# Patient Record
Sex: Male | Born: 1955 | Race: White | Hispanic: No | Smoking: Current every day smoker
Health system: Southern US, Community
[De-identification: ages and names within clinical notes are randomized; demographics above are authoritative.]

## PROBLEM LIST (undated history)

## (undated) DIAGNOSIS — E119 Type 2 diabetes mellitus without complications: Secondary | ICD-10-CM

## (undated) DIAGNOSIS — I10 Essential (primary) hypertension: Secondary | ICD-10-CM

## (undated) HISTORY — PX: COLOSTOMY: SHX63

## (undated) HISTORY — PX: OTHER SURGICAL HISTORY: SHX169

## (undated) HISTORY — PX: ABOVE KNEE LEG AMPUTATION: SUR20

---

## 2010-12-22 ENCOUNTER — Inpatient Hospital Stay: Payer: Self-pay | Admitting: Internal Medicine

## 2013-12-16 ENCOUNTER — Ambulatory Visit: Payer: Self-pay | Admitting: Unknown Physician Specialty

## 2013-12-16 LAB — BASIC METABOLIC PANEL
Anion Gap: 7 (ref 7–16)
BUN: 9 mg/dL (ref 7–18)
CO2: 27 mmol/L (ref 21–32)
CREATININE: 1 mg/dL (ref 0.60–1.30)
Calcium, Total: 9.1 mg/dL (ref 8.5–10.1)
Chloride: 109 mmol/L — ABNORMAL HIGH (ref 98–107)
EGFR (African American): >60
Glucose: 92 mg/dL (ref 65–99)
Osmolality: 283 (ref 275–301)
Potassium: 3.3 mmol/L — ABNORMAL LOW (ref 3.5–5.1)
Sodium: 143 mmol/L (ref 136–145)

## 2013-12-16 LAB — CBC WITH DIFFERENTIAL/PLATELET
BASOS ABS: 0 10*3/uL (ref 0.0–0.1)
BASOS PCT: 0.9 %
EOS ABS: 0.2 10*3/uL (ref 0.0–0.7)
Eosinophil %: 3.6 %
HCT: 36.8 % — ABNORMAL LOW (ref 40.0–52.0)
HGB: 11.5 g/dL — ABNORMAL LOW (ref 13.0–18.0)
LYMPHS ABS: 0.7 10*3/uL — AB (ref 1.0–3.6)
Lymphocyte %: 12.4 %
MCH: 26.4 pg (ref 26.0–34.0)
MCHC: 31.3 g/dL — AB (ref 32.0–36.0)
MCV: 84 fL (ref 80–100)
Monocyte #: 0.5 x10 3/mm (ref 0.2–1.0)
Monocyte %: 9.5 %
NEUTROS ABS: 4 10*3/uL (ref 1.4–6.5)
NEUTROS PCT: 73.6 %
Platelet: 231 10*3/uL (ref 150–440)
RBC: 4.36 10*6/uL — ABNORMAL LOW (ref 4.40–5.90)
RDW: 16.3 % — ABNORMAL HIGH (ref 11.5–14.5)
WBC: 5.5 10*3/uL (ref 3.8–10.6)

## 2014-01-07 ENCOUNTER — Ambulatory Visit: Payer: Self-pay | Admitting: Gastroenterology

## 2014-01-07 LAB — CBC WITH DIFFERENTIAL/PLATELET
BASOS ABS: 0 10*3/uL (ref 0.0–0.1)
BASOS PCT: 0.7 %
Eosinophil #: 0.1 10*3/uL (ref 0.0–0.7)
Eosinophil %: 2.1 %
HCT: 31.4 % — ABNORMAL LOW (ref 40.0–52.0)
HGB: 9.7 g/dL — AB (ref 13.0–18.0)
LYMPHS PCT: 13.6 %
Lymphocyte #: 0.6 10*3/uL — ABNORMAL LOW (ref 1.0–3.6)
MCH: 25.4 pg — ABNORMAL LOW (ref 26.0–34.0)
MCHC: 31 g/dL — ABNORMAL LOW (ref 32.0–36.0)
MCV: 82 fL (ref 80–100)
MONOS PCT: 5.3 %
Monocyte #: 0.2 x10 3/mm (ref 0.2–1.0)
NEUTROS ABS: 3.5 10*3/uL (ref 1.4–6.5)
Neutrophil %: 78.3 %
PLATELETS: 229 10*3/uL (ref 150–440)
RBC: 3.84 10*6/uL — AB (ref 4.40–5.90)
RDW: 16.7 % — ABNORMAL HIGH (ref 11.5–14.5)
WBC: 4.5 10*3/uL (ref 3.8–10.6)

## 2014-01-07 LAB — BASIC METABOLIC PANEL
ANION GAP: 13 (ref 7–16)
BUN: 7 mg/dL (ref 7–18)
CALCIUM: 9 mg/dL (ref 8.5–10.1)
CHLORIDE: 103 mmol/L (ref 98–107)
Co2: 22 mmol/L (ref 21–32)
Creatinine: 0.88 mg/dL (ref 0.60–1.30)
EGFR (African American): >60
GLUCOSE: 108 mg/dL — AB (ref 65–99)
Osmolality: 274 (ref 275–301)
Potassium: 3.1 mmol/L — ABNORMAL LOW (ref 3.5–5.1)
Sodium: 138 mmol/L (ref 136–145)

## 2014-01-07 LAB — ETHANOL
ETHANOL %: 0.109 % — AB (ref 0.000–0.080)
Ethanol: 109 mg/dL

## 2014-01-20 ENCOUNTER — Ambulatory Visit: Payer: Self-pay | Admitting: Unknown Physician Specialty

## 2014-07-23 ENCOUNTER — Emergency Department: Payer: Self-pay | Admitting: Emergency Medicine

## 2014-07-23 LAB — URINALYSIS, COMPLETE
Bilirubin,UR: NEGATIVE
Glucose,UR: NEGATIVE mg/dL (ref 0–75)
Ketone: NEGATIVE
NITRITE: NEGATIVE
PH: 7 (ref 4.5–8.0)
Protein: 30
Specific Gravity: 1.006 (ref 1.003–1.030)
Squamous Epithelial: NONE SEEN

## 2014-07-27 LAB — URINE CULTURE

## 2014-08-22 ENCOUNTER — Emergency Department: Payer: Self-pay | Admitting: Emergency Medicine

## 2014-08-24 ENCOUNTER — Emergency Department: Payer: Self-pay | Admitting: Internal Medicine

## 2017-03-09 ENCOUNTER — Emergency Department: Payer: Medicaid Other

## 2017-03-09 ENCOUNTER — Inpatient Hospital Stay
Admission: EM | Admit: 2017-03-09 | Discharge: 2017-03-13 | DRG: 682 | Disposition: A | Discharge status: Home / Self Care | Payer: Medicaid Other | Attending: Internal Medicine | Admitting: Internal Medicine

## 2017-03-09 ENCOUNTER — Encounter: Payer: Self-pay | Admitting: Emergency Medicine

## 2017-03-09 DIAGNOSIS — R0602 Shortness of breath: Secondary | ICD-10-CM | POA: Diagnosis present

## 2017-03-09 DIAGNOSIS — I10 Essential (primary) hypertension: Secondary | ICD-10-CM | POA: Diagnosis present

## 2017-03-09 DIAGNOSIS — A419 Sepsis, unspecified organism: Secondary | ICD-10-CM

## 2017-03-09 DIAGNOSIS — N179 Acute kidney failure, unspecified: Secondary | ICD-10-CM | POA: Diagnosis present

## 2017-03-09 DIAGNOSIS — E875 Hyperkalemia: Secondary | ICD-10-CM

## 2017-03-09 DIAGNOSIS — L8990 Pressure ulcer of unspecified site, unspecified stage: Secondary | ICD-10-CM | POA: Diagnosis present

## 2017-03-09 DIAGNOSIS — F17210 Nicotine dependence, cigarettes, uncomplicated: Secondary | ICD-10-CM | POA: Diagnosis present

## 2017-03-09 DIAGNOSIS — K5792 Diverticulitis of intestine, part unspecified, without perforation or abscess without bleeding: Secondary | ICD-10-CM | POA: Diagnosis present

## 2017-03-09 DIAGNOSIS — Z933 Colostomy status: Secondary | ICD-10-CM | POA: Diagnosis not present

## 2017-03-09 DIAGNOSIS — R571 Hypovolemic shock: Secondary | ICD-10-CM | POA: Diagnosis present

## 2017-03-09 DIAGNOSIS — E11649 Type 2 diabetes mellitus with hypoglycemia without coma: Secondary | ICD-10-CM | POA: Diagnosis present

## 2017-03-09 DIAGNOSIS — E872 Acidosis: Secondary | ICD-10-CM | POA: Diagnosis present

## 2017-03-09 DIAGNOSIS — Z881 Allergy status to other antibiotic agents status: Secondary | ICD-10-CM | POA: Diagnosis not present

## 2017-03-09 DIAGNOSIS — I959 Hypotension, unspecified: Secondary | ICD-10-CM | POA: Diagnosis not present

## 2017-03-09 DIAGNOSIS — R6521 Severe sepsis with septic shock: Secondary | ICD-10-CM | POA: Diagnosis not present

## 2017-03-09 DIAGNOSIS — E871 Hypo-osmolality and hyponatremia: Secondary | ICD-10-CM

## 2017-03-09 DIAGNOSIS — R918 Other nonspecific abnormal finding of lung field: Secondary | ICD-10-CM

## 2017-03-09 DIAGNOSIS — Z880 Allergy status to penicillin: Secondary | ICD-10-CM | POA: Diagnosis not present

## 2017-03-09 DIAGNOSIS — Z89611 Acquired absence of right leg above knee: Secondary | ICD-10-CM | POA: Diagnosis not present

## 2017-03-09 DIAGNOSIS — F1010 Alcohol abuse, uncomplicated: Secondary | ICD-10-CM | POA: Diagnosis present

## 2017-03-09 DIAGNOSIS — E1165 Type 2 diabetes mellitus with hyperglycemia: Secondary | ICD-10-CM | POA: Diagnosis not present

## 2017-03-09 DIAGNOSIS — J441 Chronic obstructive pulmonary disease with (acute) exacerbation: Secondary | ICD-10-CM | POA: Diagnosis present

## 2017-03-09 DIAGNOSIS — Z23 Encounter for immunization: Secondary | ICD-10-CM

## 2017-03-09 HISTORY — DX: Type 2 diabetes mellitus without complications: E11.9

## 2017-03-09 HISTORY — DX: Essential (primary) hypertension: I10

## 2017-03-09 LAB — HEPATIC FUNCTION PANEL
ALBUMIN: 3.1 g/dL — AB (ref 3.5–5.0)
ALK PHOS: 127 U/L — AB (ref 38–126)
ALT: 12 U/L — AB (ref 17–63)
AST: 37 U/L (ref 15–41)
BILIRUBIN TOTAL: 0.8 mg/dL (ref 0.3–1.2)
Bilirubin, Direct: 0.2 mg/dL (ref 0.1–0.5)
Indirect Bilirubin: 0.6 mg/dL (ref 0.3–0.9)
TOTAL PROTEIN: 7.1 g/dL (ref 6.5–8.1)

## 2017-03-09 LAB — LACTIC ACID, PLASMA: Lactic Acid, Venous: 2.4 mmol/L (ref 0.5–1.9)

## 2017-03-09 LAB — CBC WITH DIFFERENTIAL/PLATELET
BASOS ABS: 0 10*3/uL (ref 0–0.1)
BASOS PCT: 0 %
EOS ABS: 0.1 10*3/uL (ref 0–0.7)
Eosinophils Relative: 2 %
HCT: 34.6 % — ABNORMAL LOW (ref 40.0–52.0)
Hemoglobin: 11.8 g/dL — ABNORMAL LOW (ref 13.0–18.0)
Lymphocytes Relative: 13 %
Lymphs Abs: 0.8 10*3/uL — ABNORMAL LOW (ref 1.0–3.6)
MCH: 31 pg (ref 26.0–34.0)
MCHC: 34.2 g/dL (ref 32.0–36.0)
MCV: 90.8 fL (ref 80.0–100.0)
Monocytes Absolute: 0.2 10*3/uL (ref 0.2–1.0)
Monocytes Relative: 4 %
NEUTROS PCT: 81 %
Neutro Abs: 5.1 10*3/uL (ref 1.4–6.5)
Platelets: 209 10*3/uL (ref 150–440)
RBC: 3.81 MIL/uL — AB (ref 4.40–5.90)
RDW: 13.9 % (ref 11.5–14.5)
WBC: 6.2 10*3/uL (ref 3.8–10.6)

## 2017-03-09 LAB — GLUCOSE, CAPILLARY
Glucose-Capillary: 161 mg/dL — ABNORMAL HIGH (ref 65–99)
Glucose-Capillary: 28 mg/dL — CL (ref 65–99)
Glucose-Capillary: 166 mg/dL — ABNORMAL HIGH (ref 65–99)

## 2017-03-09 LAB — BASIC METABOLIC PANEL WITH GFR
Anion gap: 10 (ref 5–15)
BUN: 10 mg/dL (ref 6–20)
CO2: 17 mmol/L — ABNORMAL LOW (ref 22–32)
Calcium: 8.7 mg/dL — ABNORMAL LOW (ref 8.9–10.3)
Chloride: 91 mmol/L — ABNORMAL LOW (ref 101–111)
Creatinine, Ser: 1.81 mg/dL — ABNORMAL HIGH (ref 0.61–1.24)
GFR calc Af Amer: 44 mL/min — ABNORMAL LOW
GFR calc non Af Amer: 38 mL/min — ABNORMAL LOW
Glucose, Bld: 81 mg/dL (ref 65–99)
Potassium: 5.2 mmol/L — ABNORMAL HIGH (ref 3.5–5.1)
Sodium: 118 mmol/L — CL (ref 135–145)

## 2017-03-09 LAB — TROPONIN I: Troponin I: <0.03 ng/mL

## 2017-03-09 LAB — BRAIN NATRIURETIC PEPTIDE: B Natriuretic Peptide: 14 pg/mL (ref 0.0–100.0)

## 2017-03-09 MED ORDER — SODIUM CHLORIDE 0.9 % IV SOLN
INTRAVENOUS | Status: DC
  Administered 2017-03-10: 1000 mL via INTRAVENOUS

## 2017-03-09 MED ORDER — ENOXAPARIN SODIUM 40 MG/0.4ML ~~LOC~~ SOLN
40.0000 mg | SUBCUTANEOUS | Status: DC
  Administered 2017-03-10 (×2): 40 mg via SUBCUTANEOUS
  Filled 2017-03-09 (×2): qty 0.4

## 2017-03-09 MED ORDER — SODIUM POLYSTYRENE SULFONATE 15 GM/60ML PO SUSP: 15.0000 g | Freq: Once | ORAL | Status: DC

## 2017-03-09 MED ORDER — DEXTROSE 50 % IV SOLN
1.0000 | Freq: Once | INTRAVENOUS | Status: AC
  Administered 2017-03-09: 50 mL via INTRAVENOUS
  Filled 2017-03-09: qty 50

## 2017-03-09 MED ORDER — INSULIN ASPART 100 UNIT/ML ~~LOC~~ SOLN
0.0000 [IU] | Freq: Three times a day (TID) | SUBCUTANEOUS | Status: DC
  Administered 2017-03-10: 8 [IU] via SUBCUTANEOUS
  Administered 2017-03-10: 5 [IU] via SUBCUTANEOUS
  Filled 2017-03-09 (×2): qty 1

## 2017-03-09 MED ORDER — SODIUM CHLORIDE 0.9 % IV BOLUS (SEPSIS)
1000.0000 mL | Freq: Once | INTRAVENOUS | Status: AC
  Administered 2017-03-09: 1000 mL via INTRAVENOUS

## 2017-03-09 MED ORDER — IPRATROPIUM-ALBUTEROL 0.5-2.5 (3) MG/3ML IN SOLN
3.0000 mL | Freq: Once | RESPIRATORY_TRACT | Status: AC
  Administered 2017-03-09: 3 mL via RESPIRATORY_TRACT
  Filled 2017-03-09: qty 3

## 2017-03-09 MED ORDER — POLYETHYLENE GLYCOL 3350 17 G PO PACK: 17.0000 g | PACK | Freq: Every day | ORAL | Status: DC | PRN

## 2017-03-09 MED ORDER — AMLODIPINE BESYLATE 10 MG PO TABS
10.0000 mg | ORAL_TABLET | Freq: Every day | ORAL | Status: DC
  Filled 2017-03-09 (×2): qty 1

## 2017-03-09 MED ORDER — THIAMINE HCL 100 MG/ML IJ SOLN
Freq: Once | INTRAVENOUS | Status: AC
  Administered 2017-03-09: via INTRAVENOUS
  Filled 2017-03-09: qty 1000

## 2017-03-09 MED ORDER — ONDANSETRON HCL 4 MG PO TABS
4.0000 mg | ORAL_TABLET | Freq: Four times a day (QID) | ORAL | Status: DC | PRN
  Administered 2017-03-11: 4 mg via ORAL
  Filled 2017-03-09: qty 1

## 2017-03-09 MED ORDER — ONDANSETRON HCL 4 MG/2ML IJ SOLN
4.0000 mg | Freq: Four times a day (QID) | INTRAMUSCULAR | Status: DC | PRN
  Administered 2017-03-10: 4 mg via INTRAVENOUS
  Filled 2017-03-09: qty 2

## 2017-03-09 MED ORDER — FERROUS SULFATE 325 (65 FE) MG PO TABS
325.0000 mg | ORAL_TABLET | Freq: Every day | ORAL | Status: DC
  Administered 2017-03-10 – 2017-03-13 (×4): 325 mg via ORAL
  Filled 2017-03-09 (×4): qty 1

## 2017-03-09 MED ORDER — INSULIN ASPART 100 UNIT/ML ~~LOC~~ SOLN
10.0000 [IU] | Freq: Once | SUBCUTANEOUS | Status: AC
  Administered 2017-03-09: 10 [IU] via INTRAVENOUS
  Filled 2017-03-09: qty 1

## 2017-03-09 MED ORDER — ACETAMINOPHEN 325 MG PO TABS
650.0000 mg | ORAL_TABLET | Freq: Four times a day (QID) | ORAL | Status: DC | PRN
  Administered 2017-03-10 – 2017-03-12 (×5): 650 mg via ORAL
  Filled 2017-03-09 (×5): qty 2

## 2017-03-09 MED ORDER — SULFAMETHOXAZOLE-TRIMETHOPRIM 800-160 MG PO TABS
1.0000 | ORAL_TABLET | Freq: Two times a day (BID) | ORAL | Status: DC
  Administered 2017-03-10 – 2017-03-13 (×8): 1 via ORAL
  Filled 2017-03-09 (×9): qty 1

## 2017-03-09 MED ORDER — FOLIC ACID 1 MG PO TABS
1.0000 mg | ORAL_TABLET | Freq: Every day | ORAL | Status: DC
  Administered 2017-03-10 – 2017-03-13 (×4): 1 mg via ORAL
  Filled 2017-03-09 (×4): qty 1

## 2017-03-09 MED ORDER — DEXTROSE 50 % IV SOLN
1.0000 | Freq: Once | INTRAVENOUS | Status: AC
  Administered 2017-03-09: 50 mL via INTRAVENOUS

## 2017-03-09 MED ORDER — DEXTROSE 50 % IV SOLN
INTRAVENOUS | Status: AC
  Filled 2017-03-09: qty 100

## 2017-03-09 MED ORDER — NOREPINEPHRINE BITARTRATE 1 MG/ML IV SOLN
0.0000 ug/min | Freq: Once | INTRAVENOUS | Status: AC
  Administered 2017-03-09: 2 ug/min via INTRAVENOUS
  Filled 2017-03-09: qty 4

## 2017-03-09 MED ORDER — PREDNISONE 20 MG PO TABS
50.0000 mg | ORAL_TABLET | Freq: Every day | ORAL | Status: DC
  Administered 2017-03-10 – 2017-03-12 (×3): 50 mg via ORAL
  Filled 2017-03-09 (×3): qty 1

## 2017-03-09 MED ORDER — ALBUTEROL SULFATE (2.5 MG/3ML) 0.083% IN NEBU: 2.5000 mg | INHALATION_SOLUTION | RESPIRATORY_TRACT | Status: DC | PRN

## 2017-03-09 MED ORDER — INDOMETHACIN 50 MG PO CAPS
50.0000 mg | ORAL_CAPSULE | Freq: Two times a day (BID) | ORAL | Status: DC
  Administered 2017-03-10: 50 mg via ORAL
  Filled 2017-03-09: qty 1

## 2017-03-09 MED ORDER — PANTOPRAZOLE SODIUM 40 MG PO TBEC
40.0000 mg | DELAYED_RELEASE_TABLET | Freq: Every day | ORAL | Status: DC
  Administered 2017-03-10 – 2017-03-13 (×4): 40 mg via ORAL
  Filled 2017-03-09 (×4): qty 1

## 2017-03-09 MED ORDER — SODIUM BICARBONATE 8.4 % IV SOLN
50.0000 meq | Freq: Once | INTRAVENOUS | Status: AC
  Administered 2017-03-09: 50 meq via INTRAVENOUS
  Filled 2017-03-09: qty 50

## 2017-03-09 MED ORDER — VANCOMYCIN HCL IN DEXTROSE 1-5 GM/200ML-% IV SOLN
1000.0000 mg | Freq: Once | INTRAVENOUS | Status: AC
  Administered 2017-03-09: 1000 mg via INTRAVENOUS
  Filled 2017-03-09: qty 200

## 2017-03-09 MED ORDER — LEVOFLOXACIN IN D5W 750 MG/150ML IV SOLN
750.0000 mg | Freq: Once | INTRAVENOUS | Status: AC
Start: 2017-03-09 — End: 2017-03-09
  Administered 2017-03-09: 750 mg via INTRAVENOUS
  Filled 2017-03-09: qty 150

## 2017-03-09 MED ORDER — ADULT MULTIVITAMIN W/MINERALS CH
1.0000 | ORAL_TABLET | Freq: Every day | ORAL | Status: DC
  Administered 2017-03-10 – 2017-03-13 (×4): 1 via ORAL
  Filled 2017-03-09 (×4): qty 1

## 2017-03-09 MED ORDER — SIMVASTATIN 10 MG PO TABS
10.0000 mg | ORAL_TABLET | Freq: Every day | ORAL | Status: DC
  Administered 2017-03-10: 10 mg via ORAL
  Administered 2017-03-10: 03:00:00 via ORAL
  Administered 2017-03-11 – 2017-03-13 (×3): 10 mg via ORAL
  Filled 2017-03-09 (×5): qty 1

## 2017-03-09 MED ORDER — VITAMIN B-1 100 MG PO TABS
100.0000 mg | ORAL_TABLET | Freq: Every day | ORAL | Status: DC
  Administered 2017-03-10 – 2017-03-13 (×4): 100 mg via ORAL
  Filled 2017-03-09 (×4): qty 1

## 2017-03-09 MED ORDER — POTASSIUM CHLORIDE CRYS ER 20 MEQ PO TBCR
20.0000 meq | EXTENDED_RELEASE_TABLET | Freq: Every day | ORAL | Status: DC
  Administered 2017-03-10 – 2017-03-13 (×4): 20 meq via ORAL
  Filled 2017-03-09 (×4): qty 1

## 2017-03-09 MED ORDER — NICOTINE 14 MG/24HR TD PT24
14.0000 mg | MEDICATED_PATCH | Freq: Every day | TRANSDERMAL | Status: DC
  Administered 2017-03-10 – 2017-03-13 (×4): 14 mg via TRANSDERMAL
  Filled 2017-03-09 (×4): qty 1

## 2017-03-09 MED ORDER — ALBUTEROL SULFATE (2.5 MG/3ML) 0.083% IN NEBU: 5.0000 mg | INHALATION_SOLUTION | Freq: Once | RESPIRATORY_TRACT | Status: DC

## 2017-03-09 MED ORDER — IPRATROPIUM-ALBUTEROL 0.5-2.5 (3) MG/3ML IN SOLN
3.0000 mL | Freq: Four times a day (QID) | RESPIRATORY_TRACT | Status: DC
  Administered 2017-03-10 – 2017-03-11 (×4): 3 mL via RESPIRATORY_TRACT
  Filled 2017-03-09 (×7): qty 3

## 2017-03-09 MED ORDER — INSULIN ASPART 100 UNIT/ML ~~LOC~~ SOLN: 2.0000 [IU] | SUBCUTANEOUS | Status: DC

## 2017-03-09 NOTE — ED Notes (Signed)
RN paged admitting MD to inform BP continues to drop. MD verbalized to keep 2 NS bolus's running wide open and page again in 30 minutes if pts condition has not improved. 1 NS bolus has finished. Second bag placed on pressure bag and third bag started sue to pts  Continued drop in BP.

## 2017-03-09 NOTE — Progress Notes (Signed)
Anticoagulation monitoring(Lovenox):  61 yo male ordered Lovenox 40 mg Q24h  Filed Weights   03/09/17 1838 03/09/17 2337  Weight: 170 lb (77.1 kg) 147 lb 11.3 oz (67 kg)   BMI    Lab Results  Component Value Date   CREATININE 1.81 (H) 03/09/2017   CREATININE 0.88 01/07/2014   CREATININE 1.00 12/16/2013   Estimated Creatinine Clearance: 39.6 mL/min (A) (by C-G formula based on SCr of 1.81 mg/dL (H)). Hemoglobin & Hematocrit     Component Value Date/Time   HGB 11.8 (L) 03/09/2017 1852   HGB 9.7 (L) 01/07/2014 1906   HCT 34.6 (L) 03/09/2017 1852   HCT 31.4 (L) 01/07/2014 1906     Per Protocol for Patient with estCrcl > 30 ml/min and BMI < 40, will transition to Lovenox 40 mg Q24h.

## 2017-03-09 NOTE — Consult Note (Signed)
Name: Shawn SchlichterRobert Higgins MRN: [id number] DOB: 13-Jun-1956    ADMISSION DATE:  03/09/2017  CONSULTATION DATE: 03/09/17  REFERRING MD :  Dr. Claudette Higgins  CHIEF COMPLAINT:  Shortness of breath and hypotension  BRIEF PATIENT DESCRIPTION: 61 year old male with hyponatremia ,Acute kidney injury possibly related to hypovolemia.  Now in shock and on pressors.  SIGNIFICANT EVENTS  8/30 Patient admitted to the ICU with hyponatrmia,AKI and hypovolemic shock and now requiring pressors.  STUDIES:  None   HISTORY OF PRESENT ILLNESS: [name] is a 61 year old male with known history of Diabetes Mellitus,Hypertension, Right above Knee amputation,ETOH,Tobacco abuse and diverticulitis.  Patient came in 8/30 with shortness of breath and was completely resolved after giving breathing treatments. Patient also states that he had dry heaves,poor po intake for almost a week.  Patient was noted to be hyponatremic , acute kidney injury and severely hypotensive.  Patient was given fluid  Boluses and was started on pressors.  Patient refuses to get the central line placed per hospitalist.  Patient transferred to the ICU for further management.  PAST MEDICAL HISTORY :   has a past medical history of Diabetes mellitus without complication (HCC) and Hypertension.  has a past surgical history that includes Above knee leg amputaton (Right); Colostomy; and shoulder surgery. Prior to Admission medications   Medication Sig Start Date End Date Taking? Authorizing Provider  acetaminophen (TYLENOL) 325 MG tablet Take 650 mg by mouth every 4 (four) hours as needed.   Yes [provider]  amLODipine (NORVASC) 10 MG tablet Take 10 mg by mouth daily.   Yes [provider]  ferrous sulfate 325 (65 FE) MG tablet Take 1 tablet by mouth daily.   Yes [provider]  folic acid (FOLVITE) 1 MG tablet Take 1 mg by mouth daily.   Yes [provider]  indomethacin (INDOCIN) 50 MG capsule Take 50 mg by  mouth 2 (two) times daily with a meal.   Yes [provider]  lisinopril (PRINIVIL,ZESTRIL) 20 MG tablet Take 20 mg by mouth 2 (two) times daily.   Yes [provider]  Multiple Vitamin (MULTIVITAMIN) tablet Take 1 tablet by mouth daily.   Yes [provider]  omeprazole (PRILOSEC) 20 MG capsule Take 1 capsule by mouth daily.   Yes [provider]  potassium chloride SA (K-DUR,KLOR-CON) 20 MEQ tablet Take 20 mEq by mouth daily.   Yes [provider]  simvastatin (ZOCOR) 10 MG tablet Take 1 tablet by mouth daily.   Yes [provider]  thiamine (VITAMIN B-1) 100 MG tablet Take 100 mg by mouth daily.   Yes [provider]  sulfamethoxazole-trimethoprim (BACTRIM DS,SEPTRA DS) 800-160 MG tablet Take 1 tablet by mouth 2 (two) times daily. 02/28/17 03/09/17  [provider]   Allergies  Allergen Reactions  . Cephalosporins   . Penicillins     FAMILY HISTORY:  family history is not on file. SOCIAL HISTORY:  reports that he has been smoking Cigarettes.  He has been smoking about 0.50 packs per day. He has quit using smokeless tobacco. He reports that he drinks alcohol. He reports that he does not use drugs.  REVIEW OF SYSTEMS:   Constitutional: Negative for fever, chills, weight loss, malaise/fatigue and diaphoresis.  HENT: Negative for hearing loss, ear pain, nosebleeds, congestion, sore throat, neck pain, tinnitus and ear discharge.   Eyes: Negative for blurred vision, double vision, photophobia, pain, discharge and redness.  Respiratory: Negative for cough, hemoptysis, sputum  production, shortness of breath, wheezing and stridor.   Cardiovascular: Negative for chest pain, palpitations, orthopnea, claudication, leg swelling and PND.  Gastrointestinal: Negative for heartburn, nausea, vomiting, abdominal pain, diarrhea, constipation, blood in stool and melena.  Genitourinary: Negative for dysuria, urgency, frequency, hematuria  and flank pain.  Musculoskeletal: Negative for myalgias, back pain, joint pain and falls.  Skin: Negative for itching and rash.  Neurological: Negative for dizziness, tingling, tremors, sensory change, speech change, focal weakness, seizures, loss of consciousness, weakness and headaches.  Endo/Heme/Allergies: Negative for environmental allergies and polydipsia. Does not bruise/bleed easily.  SUBJECTIVE: Patient states that "He is feeling alright"  VITAL SIGNS: Temp:  [98.5 F (36.9 C)] 98.5 F (36.9 C) (08/30 1832) Pulse Rate:  [84-113] 100 (08/30 2215) Resp:  [13-36] 20 (08/30 2215) BP: (50-119)/(33-85) 63/51 (08/30 2214) SpO2:  [90 %-99 %] 91 % (08/30 2215) Weight:  [77.1 kg (170 lb)] 77.1 kg (170 lb) (08/30 1838)  PHYSICAL EXAMINATION: General:  61 year old male in no acute distress Neuro:  Awake,Alert and oriented HEENT:  AT,[place],No JVD Cardiovascular:  S1S2,Regular, no m/r/g Lungs:  Clear bilaterally, no wheezes,crackles,rhonchi noted Abdomen:  Soft,NT,ND, positive bowel sounds Musculoskeletal:  No edema,cyanosis, Right AKA Skin:  Warm,dry and intact   Recent Labs Lab 03/09/17 1852  NA 118*  K 5.2*  CL 91*  CO2 17*  BUN 10  CREATININE 1.81*  GLUCOSE 81    Recent Labs Lab 03/09/17 1852  HGB 11.8*  HCT 34.6*  WBC 6.2  PLT 209   Dg Chest 2 View  Result Date: 03/09/2017 CLINICAL DATA:  Shortness of breath since earlier this afternoon, hypoglycemia. EXAM: CHEST  2 VIEW COMPARISON:  12/22/2010 FINDINGS: Enlargement of cardiac silhouette. Atherosclerotic calcification aorta. Mediastinal contours and pulmonary vascularity normal. Emphysematous and bronchitic changes consistent with COPD. New elevation of the LEFT diaphragm with significant LEFT basilar atelectasis. Streaky atelectasis at RIGHT base as well. Upper lungs clear. No pleural effusion or pneumothorax. Bones demineralized with evidence of prior cervical spine fusion and proximal LEFT humeral ORIF. IMPRESSION:  Bibasilar atelectasis greater on LEFT with new elevation of the LEFT diaphragm. Underlying emphysematous and bronchitic changes. Mild enlargement of cardiac silhouette. Electronically Signed   By: Ulyses Southward M.D.   On: 03/09/2017 20:29    ASSESSMENT / PLAN: Hypovolemic shock Hypovolemic hyponatremia Vs ETOH abuse. Hyperkalemia Acute Kidney Injury related to poor po intake Tobacco abuse ETOH abuse COPD Hypoglycemia   Plan -Continue I/V fluids -Continue levophed gtt -Keep MAP goals>65 -Will place central line if levophed demand continues to increase>10 mcg, obtained consent and the patient verbalizes understanding -Continue Nicotine patch - Continue Bronchodilators -Continue Thiamine and folic acid - monitor I/O -Blood glucose checks with SSI -follow hypoglycemia protocol - avoid nephrotoxic drugs -Monitor BMET -Follow CBC,fever - will initiate CIWA if needed - serial sodium checks - follow serum osmolality and random urine osmolality -Hold antihypertensives now.     Bernie Fobes,AG-ACNP Pulmonary and [facility]   03/09/2017, 10:15 PM

## 2017-03-09 NOTE — ED Triage Notes (Signed)
[facility] c/o shortness of breath that started earlier in the afternoon; per EMS pt's VS were WDL, O2 sats were 96% on RA; pt was put on 3L via  and maintained O2 sats at 96%; pt's CBG was 69, pt was given some crackers to eat and CBG brought up to 87; pt has a colostomy bag on the left lower abdomen, and above the knee amputation of the right leg; at this time, pt is awake, alert and oriented x4.

## 2017-03-09 NOTE — ED Notes (Signed)
Contacted pharmacy to inquire how this RN can administer the multivitamin, thiamine, folic acid, in NS bolus with the patient only having 2 IVs; pharmacy states, since the patient has levaquin and vanc running on one side and norepi on the other side, we have to wait until the levaquin is done to start the bolus. MD notified.

## 2017-03-09 NOTE — ED Notes (Signed)
RN paged MD again. PT continues to be hypotensive and is very diaphoretic. Pt reports feeling lightheaded and feeling as though he has a rushing feeling in his head.

## 2017-03-09 NOTE — H&P (Signed)
[facility] [place] [facility]   PATIENT NAME: Shawn SchlichterRobert Higgins    MR#:  [id number]  DATE OF BIRTH:  May 11, 1956  DATE OF ADMISSION:  03/09/2017  PRIMARY CARE PHYSICIAN: Armando GangLindley, Cheryl P, FNP   REQUESTING/REFERRING PHYSICIAN:   CHIEF COMPLAINT:  Shortness of breath and dry heaves  HISTORY OF PRESENT ILLNESS:  [name]  is a 61 y.o. male with a known history of diverticulitis, hypertension, tobacco abuse is presenting to the ED with a chief complaint of dry heaves, poor by mouth intake for almost 1 week. Blood pressure was soft , and patient's shortness of breath was completely resolved after giving breathing treatments. Sodium was low at 118 and creatinine is at 1.8. Patient denies any abdominal pain or vomiting. Denies any diarrhea. No sick contacts. No recent travel.patient reports he had colostomy done during his childhood and could not recall the etiology  PAST MEDICAL HISTORY:   Past Medical History:  Diagnosis Date  . Diabetes mellitus without complication (HCC)   . Hypertension     PAST SURGICAL HISTOIRY:   Past Surgical History:  Procedure Laterality Date  . ABOVE KNEE LEG AMPUTATION Right   . COLOSTOMY    . shoulder surgery      SOCIAL HISTORY:   Social History  Substance Use Topics  . Smoking status: Current Every Day Smoker    Packs/day: 0.50    Types: Cigarettes  . Smokeless tobacco: Former NeurosurgeonUser  . Alcohol use Yes    FAMILY HISTORY:  History reviewed. No pertinent family history.  DRUG ALLERGIES:   Allergies  Allergen Reactions  . Cephalosporins   . Penicillins     REVIEW OF SYSTEMS:  CONSTITUTIONAL: No fever, fatigue or weakness.  EYES: No blurred or double vision.  EARS, NOSE, AND THROAT: No tinnitus or ear pain.  RESPIRATORY: No cough, reportingshortness of breath with exertion,denies wheezing or hemoptysis.  CARDIOVASCULAR: No chest pain, orthopnea, edema.  GASTROINTESTINAL: reporting dry heaves but no  vomiting or diarrhea and denies any abdominal pain has chronic colostomy GENITOURINARY: No dysuria, hematuria.  ENDOCRINE: No polyuria, nocturia,  HEMATOLOGY: No anemia, easy bruising or bleeding SKIN: No rash or lesion. MUSCULOSKELETAL: No joint pain or arthritis.   NEUROLOGIC: No tingling, numbness, weakness.  PSYCHIATRY: No anxiety or depression.   MEDICATIONS AT HOME:   Prior to Admission medications   Medication Sig Start Date End Date Taking? Authorizing Provider  acetaminophen (TYLENOL) 325 MG tablet Take 650 mg by mouth every 4 (four) hours as needed.   Yes [provider]  amLODipine (NORVASC) 10 MG tablet Take 10 mg by mouth daily.   Yes [provider]  ferrous sulfate 325 (65 FE) MG tablet Take 1 tablet by mouth daily.   Yes [provider]  folic acid (FOLVITE) 1 MG tablet Take 1 mg by mouth daily.   Yes [provider]  indomethacin (INDOCIN) 50 MG capsule Take 50 mg by mouth 2 (two) times daily with a meal.   Yes [provider]  lisinopril (PRINIVIL,ZESTRIL) 20 MG tablet Take 20 mg by mouth 2 (two) times daily.   Yes [provider]  Multiple Vitamin (MULTIVITAMIN) tablet Take 1 tablet by mouth daily.   Yes [provider]  omeprazole (PRILOSEC) 20 MG capsule Take 1 capsule by mouth daily.   Yes [provider]  potassium chloride SA (K-DUR,KLOR-CON) 20 MEQ tablet Take 20 mEq by mouth daily.   Yes [provider]  simvastatin (ZOCOR) 10  MG tablet Take 1 tablet by mouth daily.   Yes [provider]  thiamine (VITAMIN B-1) 100 MG tablet Take 100 mg by mouth daily.   Yes [provider]  sulfamethoxazole-trimethoprim (BACTRIM DS,SEPTRA DS) 800-160 MG tablet Take 1 tablet by mouth 2 (two) times daily. 02/28/17 03/09/17  [provider]      VITAL SIGNS:  Blood pressure 119/85, pulse 84, temperature 98.5 F (36.9 C), temperature source Oral, resp. rate 20, height 5\' 10"   (1.778 m), weight 77.1 kg (170 lb), SpO2 99 %.  PHYSICAL EXAMINATION:  GENERAL:  61 y.o.-year-old patient lying in the bed with no acute distress.  EYES: Pupils equal, round, reactive to light and accommodation. No scleral icterus. Extraocular muscles intact.  HEENT: Head atraumatic, normocephalic. Oropharynx and nasopharynx clear.  NECK:  Supple, no jugular venous distention. No thyroid enlargement, no tenderness.  LUNGS: Normal breath sounds bilaterally, no wheezing, rales,rhonchi or crepitation. No use of accessory muscles of respiration.  CARDIOVASCULAR: S1, S2 normal. No murmurs, rubs, or gallops.  ABDOMEN: Soft, nontender, nondistended. Bowel sounds present.colostomy site is intact  EXTREMITIES: No pedal edema, cyanosis, or clubbing.  NEUROLOGIC: Cranial nerves II through XII are intact. Muscle strength 5/5 in all extremities. Sensation intact. Gait not checked.  PSYCHIATRIC: The patient is alert and oriented x 3.  SKIN: No obvious rash, lesion, or ulcer.   LABORATORY PANEL:   CBC  Recent Labs Lab 03/09/17 1852  WBC 6.2  HGB 11.8*  HCT 34.6*  PLT 209   ------------------------------------------------------------------------------------------------------------------  Chemistries   Recent Labs Lab 03/09/17 1852  NA 118*  K 5.2*  CL 91*  CO2 17*  GLUCOSE 81  BUN 10  CREATININE 1.81*  CALCIUM 8.7*   ------------------------------------------------------------------------------------------------------------------  Cardiac Enzymes  Recent Labs Lab 03/09/17 1852  TROPONINI <0.03   ------------------------------------------------------------------------------------------------------------------  RADIOLOGY:  Dg Chest 2 View  Result Date: 03/09/2017 CLINICAL DATA:  Shortness of breath since earlier this afternoon, hypoglycemia. EXAM: CHEST  2 VIEW COMPARISON:  12/22/2010 FINDINGS: Enlargement of cardiac silhouette. Atherosclerotic calcification aorta. Mediastinal  contours and pulmonary vascularity normal. Emphysematous and bronchitic changes consistent with COPD. New elevation of the LEFT diaphragm with significant LEFT basilar atelectasis. Streaky atelectasis at RIGHT base as well. Upper lungs clear. No pleural effusion or pneumothorax. Bones demineralized with evidence of prior cervical spine fusion and proximal LEFT humeral ORIF. IMPRESSION: Bibasilar atelectasis greater on LEFT with new elevation of the LEFT diaphragm. Underlying emphysematous and bronchitic changes. Mild enlargement of cardiac silhouette. Electronically Signed   By: Ulyses Southward M.D.   On: 03/09/2017 20:29    EKG:   Orders placed or performed during the hospital encounter of 03/09/17  . ED EKG  . ED EKG  . EKG 12-Lead  . EKG 12-Lead    IMPRESSION AND PLAN:   [name]  is a 61 y.o. male with a known history of diverticulitis, hypertension, tobacco abuse is presenting to the ED with a chief complaint of dry heaves, poor by mouth intake for almost 1 week. Blood pressure was soft , and patient's shortness of breath was completely resolved after giving breathing treatments. Sodium was low at 118 and creatinine is at 1.8. Patient denies any abdominal pain or vomiting.  # Hyponatremia from dehydration Admit to MedSurg unit Gentle hydration with IV fluids-normal saline at 75 ML per hour Serial sodiums Check serum osmolality, random urine osmolality and lites Nephrology consult Check fasting lipid panel  #dry heaving causing dehydration provide supportive treatment with IV fluids,  antiemetics and PPI  #acute kidney injury from dehydration-prerenal IV fluids Avoid nephrotoxins Holding lisinopril home medication Monitor renal function closely  #mild COPD exacerbation Prednisone duonebs q 6hrs Albuterol q 4 hs prn   #Diabetes mellitus Sliding scale insulin Check hemoglobin A1c  #essential hypertension Currently patient is hypotensive Hold home medications for  hypertension Including lisinopril  #tobacco abuse disorder Counseled patient to quit smoking for 4 minutes, patient says that he is not ready but agreeable with the nicotine patch during hospital course  GI prophylaxis with Protonix DVT prophylaxis with Lovenox subcutaneous   All the records are reviewed and case discussed with ED provider. Management plans discussed with the patient, family and they are in agreement.  CODE STATUS: fc ,son is the healthcare power of attorney  TOTAL TIME TAKING CARE OF THIS PATIENT: 45  minutes.   Note: This dictation was prepared with Dragon dictation along with smaller phrase technology. Any transcriptional errors that result from this process are unintentional.  Ramonita Lab M.D on 03/09/2017 at 9:14 PM  Between 7am to 6pm - Pager - [phone]  After 6pm go to [email or web address] - password [email or web address] [facility]  [facility]  Office  [phone]  CC: Primary care physician; Armando Gang, FNP

## 2017-03-09 NOTE — ED Notes (Signed)
Admitting MD and ED MD at bedside.

## 2017-03-09 NOTE — ED Provider Notes (Signed)
[facility] Emergency Department Provider Note  ____________________________________________  Time seen: Approximately 7:54 PM  I have reviewed the triage vital signs and the nursing notes.   HISTORY  Chief Complaint Shortness of Breath   HPI Shawn SchlichterRobert Higgins is a 61 y.o. male with a history of smoking, diabetes, alcohol abuse, hypertension who presents for evaluation of shortness of breath. Patient reports one week of cough, congestion, chills. Had an episode of subjective fever yesterday. His cough is productive of white sputum. Has had progressively worsening shortness of breath markedly worse today. Has had wheezing. Patient continues to drink but is not forthcoming about how often or the amount of alcohol use. He denies chest pain. He denies abdominal pain, nausea, vomiting. He denies increased output from his ostomy.  Past Medical History:  Diagnosis Date  . Diabetes mellitus without complication (HCC)   . Hypertension     Patient Active Problem List   Diagnosis Date Noted  . AKI (acute kidney injury) (HCC) 03/09/2017    Past Surgical History:  Procedure Laterality Date  . ABOVE KNEE LEG AMPUTATION Right   . COLOSTOMY    . shoulder surgery      Prior to Admission medications   Medication Sig Start Date End Date Taking? Authorizing Provider  acetaminophen (TYLENOL) 325 MG tablet Take 650 mg by mouth every 4 (four) hours as needed.   Yes [provider]  amLODipine (NORVASC) 10 MG tablet Take 10 mg by mouth daily.   Yes [provider]  ferrous sulfate 325 (65 FE) MG tablet Take 1 tablet by mouth daily.   Yes [provider]  folic acid (FOLVITE) 1 MG tablet Take 1 mg by mouth daily.   Yes [provider]  indomethacin (INDOCIN) 50 MG capsule Take 50 mg by mouth 2 (two) times daily with a meal.   Yes [provider]  lisinopril (PRINIVIL,ZESTRIL) 20 MG tablet Take 20 mg by mouth 2 (two) times daily.    Yes [provider]  Multiple Vitamin (MULTIVITAMIN) tablet Take 1 tablet by mouth daily.   Yes [provider]  omeprazole (PRILOSEC) 20 MG capsule Take 1 capsule by mouth daily.   Yes [provider]  potassium chloride SA (K-DUR,KLOR-CON) 20 MEQ tablet Take 20 mEq by mouth daily.   Yes [provider]  simvastatin (ZOCOR) 10 MG tablet Take 1 tablet by mouth daily.   Yes [provider]  thiamine (VITAMIN B-1) 100 MG tablet Take 100 mg by mouth daily.   Yes [provider]  sulfamethoxazole-trimethoprim (BACTRIM DS,SEPTRA DS) 800-160 MG tablet Take 1 tablet by mouth 2 (two) times daily. 02/28/17 03/09/17  [provider]    Allergies Cephalosporins and Penicillins  History reviewed. No pertinent family history.  Social History Social History  Substance Use Topics  . Smoking status: Current Every Day Smoker    Packs/day: 0.50    Types: Cigarettes  . Smokeless tobacco: Former NeurosurgeonUser  . Alcohol use Yes    Review of Systems  Constitutional: + fever. Eyes: Negative for visual changes. ENT: Negative for sore throat. Neck: No neck pain  Cardiovascular: Negative for chest pain. Respiratory: + shortness of breath, coughing, wheezing Gastrointestinal: Negative for abdominal pain, vomiting or diarrhea. Genitourinary: Negative for dysuria. Musculoskeletal: Negative for back pain. Skin: Negative for rash. Neurological: Negative for headaches, weakness or numbness. Psych: No SI or HI  ____________________________________________   PHYSICAL EXAM:  VITAL SIGNS: ED Triage Vitals  Enc Vitals Group  BP 03/09/17 1832 119/85     Pulse Rate 03/09/17 1832 84     Resp 03/09/17 1832 20     Temp 03/09/17 1832 98.5 F (36.9 C)     Temp Source 03/09/17 1832 Oral     SpO2 03/09/17 1832 99 %     Weight 03/09/17 1838 170 lb (77.1 kg)     Height 03/09/17 1838 5\' 10"  (1.778 m)     Head Circumference --      Peak Flow --       Pain Score --      Pain Loc --      Pain Edu? --      Excl. in GC? --     Constitutional: Alert and oriented. Well appearing and in no apparent distress. HEENT:      Head: Normocephalic and atraumatic.         Eyes: Conjunctivae are normal. Sclera is non-icteric.       Mouth/Throat: Mucous membranes are moist.       Neck: Supple with no signs of meningismus. Cardiovascular: Regular rate and rhythm. No murmurs, gallops, or rubs. 2+ symmetrical distal pulses are present in all extremities. No JVD. Respiratory: Normal respiratory effort. Lungs are clear to auscultation bilaterally with decreased air movement and faint expiratory wheezes. Gastrointestinal: Soft, non tender, and non distended with positive bowel sounds. No rebound or guarding. Musculoskeletal: 1+ pitting edema Neurologic: Normal speech and language. Face is symmetric. Moving all extremities. No gross focal neurologic deficits are appreciated. Skin: Skin is warm, dry and intact. No rash noted. Psychiatric: Mood and affect are normal. Speech and behavior are normal.  ____________________________________________   LABS (all labs ordered are listed, but only abnormal results are displayed)  Labs Reviewed  CBC WITH DIFFERENTIAL/PLATELET - Abnormal; Notable for the following:       Result Value   RBC 3.81 (*)    Hemoglobin 11.8 (*)    HCT 34.6 (*)    Lymphs Abs 0.8 (*)    All other components within normal limits  BASIC METABOLIC PANEL - Abnormal; Notable for the following:    Sodium 118 (*)    Potassium 5.2 (*)    Chloride 91 (*)    CO2 17 (*)    Creatinine, Ser 1.81 (*)    Calcium 8.7 (*)    GFR calc non Af Amer 38 (*)    GFR calc Af Amer 44 (*)    All other components within normal limits  LACTIC ACID, PLASMA - Abnormal; Notable for the following:    Lactic Acid, Venous 2.4 (*)    All other components within normal limits  GLUCOSE, CAPILLARY - Abnormal; Notable for the following:    Glucose-Capillary 28 (*)     All other components within normal limits  GLUCOSE, CAPILLARY - Abnormal; Notable for the following:    Glucose-Capillary 161 (*)    All other components within normal limits  TROPONIN I  BRAIN NATRIURETIC PEPTIDE  HEPATIC FUNCTION PANEL   ____________________________________________  EKG  ED ECG REPORT I, Nita Sickle, the attending physician, personally viewed and interpreted this ECG.  Normal sinus rhythm, rate of 86, normal intervals, normal axis, no ST elevations or depressions.  ____________________________________________  RADIOLOGY  CXR:  Bibasilar atelectasis greater on LEFT with new elevation of the LEFT diaphragm.  Underlying emphysematous and bronchitic changes.  Mild enlargement of cardiac silhouette. ____________________________________________   PROCEDURES  Procedure(s) performed: None Procedures Critical Care performed: yes  CRITICAL CARE Performed by:  Nita Sickle  ?  Total critical care time: 60 min  Critical care time was exclusive of separately billable procedures and treating other patients.  Critical care was necessary to treat or prevent imminent or life-threatening deterioration.  Critical care was time spent personally by me on the following activities: development of treatment plan with patient and/or surrogate as well as nursing, discussions with consultants, evaluation of patient's response to treatment, examination of patient, obtaining history from patient or surrogate, ordering and performing treatments and interventions, ordering and review of laboratory studies, ordering and review of radiographic studies, pulse oximetry and re-evaluation of patient's condition.  ____________________________________________   INITIAL IMPRESSION / ASSESSMENT AND PLAN / ED COURSE  61 y.o. male with a history of smoking, diabetes, alcohol abuse, hypertension who presents for evaluation of shortness of breath. Patient has no diagnosis of COPD  but presents with wheezing consistent with COPD in the smoker. He received 2 DuoNeb labs with improvement of his respiratory status. Blood work showing hyponatremia with sodium of 118 concerning for beer potable mania. Patient also with hyperkalemia for which she was given duo nebs, IV fluids, D50, bicarbonate, and insulin. No EKG changes. Patient also fine to have acute kidney injury with creatinine of 1.81 for which he is being hydrated. I have consult the hospitalist for admission.   ED COURSE:  The patient's status deteriorated the emergency department patient was noted to be hypotensive. Patient received 3 L of fluid with no improvement of his blood pressure and was started on norepinephrine for shock. He also became hypoglycemic and was given D50.  Patient remained without a fever and chest x-ray didn't show pneumonia however since he was complaining of cough and chills I started him on Levaquin and vancomycin for sepsis from pneumonia. I recommended central line placement however patient refused that in the emergency department. Therefore norepi was started through a peripheral IV. I discussed risks of starting that medication through peripheral IV, patient understood the risks but continued to refuse a central line. Patient remained critically ill however with good mental status and response to vasopressors. Patient was admitted to ICU  Pertinent labs & imaging results that were available during my care of the patient were reviewed by me and considered in my medical decision making (see chart for details).    ____________________________________________   FINAL CLINICAL IMPRESSION(S) / ED DIAGNOSES  Final diagnoses:  COPD exacerbation (HCC)  Hyponatremia  AKI (acute kidney injury) (HCC)  Hyperkalemia  Septic shock (HCC)      NEW MEDICATIONS STARTED DURING THIS VISIT:  New Prescriptions   No medications on file     Note:  This document was prepared using Dragon voice  recognition software and may include unintentional dictation errors.    Nita Sickle, MD 03/09/17 2312

## 2017-03-09 NOTE — ED Notes (Addendum)
MD at bedside talking to pt about pros and cons of a central line.

## 2017-03-10 DIAGNOSIS — E875 Hyperkalemia: Secondary | ICD-10-CM

## 2017-03-10 DIAGNOSIS — R6521 Severe sepsis with septic shock: Secondary | ICD-10-CM

## 2017-03-10 DIAGNOSIS — E871 Hypo-osmolality and hyponatremia: Secondary | ICD-10-CM

## 2017-03-10 DIAGNOSIS — A419 Sepsis, unspecified organism: Secondary | ICD-10-CM

## 2017-03-10 DIAGNOSIS — L8990 Pressure ulcer of unspecified site, unspecified stage: Secondary | ICD-10-CM | POA: Insufficient documentation

## 2017-03-10 DIAGNOSIS — N179 Acute kidney failure, unspecified: Principal | ICD-10-CM

## 2017-03-10 LAB — CBC
HCT: 33 % — ABNORMAL LOW (ref 40.0–52.0)
HEMOGLOBIN: 11.3 g/dL — AB (ref 13.0–18.0)
MCH: 31.2 pg (ref 26.0–34.0)
MCHC: 34.3 g/dL (ref 32.0–36.0)
MCV: 91.1 fL (ref 80.0–100.0)
PLATELETS: 202 10*3/uL (ref 150–440)
RBC: 3.62 MIL/uL — AB (ref 4.40–5.90)
RDW: 14.1 % (ref 11.5–14.5)
WBC: 5.8 10*3/uL (ref 3.8–10.6)

## 2017-03-10 LAB — SODIUM
SODIUM: 133 mmol/L — AB (ref 135–145)
SODIUM: 130 mmol/L — AB (ref 135–145)
Sodium: 126 mmol/L — ABNORMAL LOW (ref 135–145)
Sodium: 125 mmol/L — ABNORMAL LOW (ref 135–145)
Sodium: 127 mmol/L — ABNORMAL LOW (ref 135–145)
Sodium: 128 mmol/L — ABNORMAL LOW (ref 135–145)

## 2017-03-10 LAB — COMPREHENSIVE METABOLIC PANEL
ALBUMIN: 2.8 g/dL — AB (ref 3.5–5.0)
ALK PHOS: 106 U/L (ref 38–126)
ALT: 11 U/L — AB (ref 17–63)
AST: 32 U/L (ref 15–41)
Anion gap: 5 (ref 5–15)
BUN: 8 mg/dL (ref 6–20)
CALCIUM: 8.1 mg/dL — AB (ref 8.9–10.3)
CHLORIDE: 104 mmol/L (ref 101–111)
CO2: 21 mmol/L — AB (ref 22–32)
CREATININE: 1.25 mg/dL — AB (ref 0.61–1.24)
GFR calc Af Amer: >60 mL/min (ref >60)
GFR calc non Af Amer: 60 mL/min — ABNORMAL LOW (ref >60)
GLUCOSE: 128 mg/dL — AB (ref 65–99)
Potassium: 4.6 mmol/L (ref 3.5–5.1)
SODIUM: 130 mmol/L — AB (ref 135–145)
Total Bilirubin: 0.9 mg/dL (ref 0.3–1.2)
Total Protein: 6.6 g/dL (ref 6.5–8.1)

## 2017-03-10 LAB — GLUCOSE, CAPILLARY
GLUCOSE-CAPILLARY: 245 mg/dL — AB (ref 65–99)
GLUCOSE-CAPILLARY: 210 mg/dL — AB (ref 65–99)
Glucose-Capillary: 166 mg/dL — ABNORMAL HIGH (ref 65–99)
Glucose-Capillary: 267 mg/dL — ABNORMAL HIGH (ref 65–99)
Glucose-Capillary: 92 mg/dL (ref 65–99)

## 2017-03-10 LAB — HEMOGLOBIN A1C
HEMOGLOBIN A1C: 4.7 % — AB (ref 4.8–5.6)
MEAN PLASMA GLUCOSE: 88.19 mg/dL

## 2017-03-10 LAB — PROCALCITONIN
Procalcitonin: <0.1 ng/mL
Procalcitonin: <0.1 ng/mL

## 2017-03-10 LAB — OSMOLALITY: OSMOLALITY: 262 mosm/kg — AB (ref 275–295)

## 2017-03-10 LAB — OSMOLALITY, URINE: OSMOLALITY UR: 143 mosm/kg — AB (ref 300–900)

## 2017-03-10 LAB — MRSA PCR SCREENING: MRSA BY PCR: NEGATIVE

## 2017-03-10 LAB — LACTIC ACID, PLASMA: LACTIC ACID, VENOUS: 2.2 mmol/L — AB (ref 0.5–1.9)

## 2017-03-10 LAB — SODIUM, URINE, RANDOM: Sodium, Ur: 55 mmol/L

## 2017-03-10 LAB — TSH: TSH: 0.857 u[IU]/mL (ref 0.350–4.500)

## 2017-03-10 MED ORDER — DEXTROSE 5 % IV SOLN
INTRAVENOUS | Status: DC
  Administered 2017-03-10: 09:00:00 via INTRAVENOUS

## 2017-03-10 MED ORDER — SODIUM CHLORIDE 0.9 % IV BOLUS (SEPSIS)
500.0000 mL | Freq: Once | INTRAVENOUS | Status: AC
  Administered 2017-03-10: 500 mL via INTRAVENOUS

## 2017-03-10 MED ORDER — INSULIN ASPART 100 UNIT/ML ~~LOC~~ SOLN
0.0000 [IU] | Freq: Four times a day (QID) | SUBCUTANEOUS | Status: DC
  Administered 2017-03-10 – 2017-03-11 (×2): 4 [IU] via SUBCUTANEOUS
  Administered 2017-03-11: 3 [IU] via SUBCUTANEOUS
  Filled 2017-03-10 (×2): qty 1

## 2017-03-10 MED ORDER — SODIUM CHLORIDE 0.9 % IV BOLUS (SEPSIS)
2000.0000 mL | Freq: Once | INTRAVENOUS | Status: AC
  Administered 2017-03-10: 2000 mL via INTRAVENOUS

## 2017-03-10 MED ORDER — SODIUM CHLORIDE 0.9 % IV BOLUS (SEPSIS)
250.0000 mL | Freq: Once | INTRAVENOUS | Status: AC
  Administered 2017-03-10: 250 mL via INTRAVENOUS

## 2017-03-10 MED ORDER — MUSCLE RUB 10-15 % EX CREA
TOPICAL_CREAM | CUTANEOUS | Status: DC | PRN
  Administered 2017-03-10: 1 via TOPICAL
  Filled 2017-03-10 (×2): qty 85

## 2017-03-10 MED ORDER — NOREPINEPHRINE BITARTRATE 1 MG/ML IV SOLN
0.0000 ug/min | INTRAVENOUS | Status: DC
  Administered 2017-03-10: 6 ug/min via INTRAVENOUS
  Administered 2017-03-11: 7 ug/min via INTRAVENOUS
  Filled 2017-03-10 (×3): qty 4

## 2017-03-10 MED ORDER — SODIUM CHLORIDE 0.9 % IV BOLUS (SEPSIS)
1000.0000 mL | Freq: Once | INTRAVENOUS | Status: AC
  Administered 2017-03-10: 1000 mL via INTRAVENOUS

## 2017-03-10 NOTE — Progress Notes (Signed)
[employer] [facility] [place] [facility]   PATIENT NAME: [name]    MR#:  [id number]  DATE OF BIRTH:  1956-06-02  SUBJECTIVE:  CHIEF COMPLAINT:   Chief Complaint  Patient presents with  . Shortness of Breath    Came with SOB complains. Have dry heaves- found to have renal failure and low sodium.  Improved sodium very quickly, but dropped again after changing to Dextrose drip. He said his BP always runs low, have no symptoms. Last night sent to stepdown due to low BP with levophed drip. Now off the drip MAP is > 60, still SBP in 70.  REVIEW OF SYSTEMS:  CONSTITUTIONAL: No fever, fatigue or weakness.  EYES: No blurred or double vision.  EARS, NOSE, AND THROAT: No tinnitus or ear pain.  RESPIRATORY: No cough, shortness of breath, wheezing or hemoptysis.  CARDIOVASCULAR: No chest pain, orthopnea, edema.  GASTROINTESTINAL: No nausea, vomiting, diarrhea or abdominal pain.  GENITOURINARY: No dysuria, hematuria.  ENDOCRINE: No polyuria, nocturia,  HEMATOLOGY: No anemia, easy bruising or bleeding SKIN: No rash or lesion. MUSCULOSKELETAL: No joint pain or arthritis.   NEUROLOGIC: No tingling, numbness, weakness.  PSYCHIATRY: No anxiety or depression.   ROS  DRUG ALLERGIES:   Allergies  Allergen Reactions  . Cephalosporins   . Penicillins     VITALS:  Blood pressure (!) 54/39, pulse 96, temperature (!) 97.3 F (36.3 C), temperature source Oral, resp. rate (!) 21, height 5\' 8"  (1.727 m), weight 67 kg (147 lb 11.3 oz), SpO2 100 %.  PHYSICAL EXAMINATION:   GENERAL:  61 y.o.-year-old patient lying in the bed with no acute distress.  EYES: Pupils equal, round, reactive to light and accommodation. No scleral icterus. Extraocular muscles intact.  HEENT: Head atraumatic, normocephalic. Oropharynx and nasopharynx clear.  NECK:  Supple, no jugular venous distention. No thyroid enlargement, no tenderness.  LUNGS: Normal breath sounds bilaterally, no wheezing,  rales,rhonchi or crepitation. No use of accessory muscles of respiration.  CARDIOVASCULAR: S1, S2 normal. No murmurs, rubs, or gallops.  ABDOMEN: Soft, nontender, nondistended. Bowel sounds present.colostomy site is intact  EXTREMITIES: No pedal edema, cyanosis, or clubbing. 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 NEUROLOGIC: Cranial nerves II through XII are intact. Muscle strength 4/5 in all extremities. Sensation intact. Gait not checked.  PSYCHIATRIC: The patient is alert and oriented x 3.  SKIN: No obvious rash, lesion, or ulcer.  Physical Exam LABORATORY PANEL:   CBC  Recent Labs Lab 03/10/17 0321  WBC 5.8  HGB 11.3*  HCT 33.0*  PLT 202   ------------------------------------------------------------------------------------------------------------------  Chemistries   Recent Labs Lab 03/10/17 0321  03/10/17 1240  NA 130*  < > 125*  K 4.6  --   --   CL 104  --   --   CO2 21*  --   --   GLUCOSE 128*  --   --   BUN 8  --   --   CREATININE 1.25*  --   --   CALCIUM 8.1*  --   --   AST 32  --   --   ALT 11*  --   --   ALKPHOS 106  --   --   BILITOT 0.9  --   --   < > = values in this interval not displayed. ------------------------------------------------------------------------------------------------------------------  Cardiac Enzymes  Recent Labs Lab 03/09/17 1852  TROPONINI <0.03   ------------------------------------------------------------------------------------------------------------------  RADIOLOGY:  Dg Chest 2 View  Result Date: 03/09/2017 CLINICAL  DATA:  Shortness of breath since earlier this afternoon, hypoglycemia. EXAM: CHEST  2 VIEW COMPARISON:  12/22/2010 FINDINGS: Enlargement of cardiac silhouette. Atherosclerotic calcification aorta. Mediastinal contours and pulmonary vascularity normal. Emphysematous and bronchitic changes consistent with COPD. New elevation of the LEFT diaphragm with significant LEFT basilar atelectasis. Streaky  atelectasis at RIGHT base as well. Upper lungs clear. No pleural effusion or pneumothorax. Bones demineralized with evidence of prior cervical spine fusion and proximal LEFT humeral ORIF. IMPRESSION: Bibasilar atelectasis greater on LEFT with new elevation of the LEFT diaphragm. Underlying emphysematous and bronchitic changes. Mild enlargement of cardiac silhouette. Electronically Signed   By: Ulyses SouthwardMark  Boles M.D.   On: 03/09/2017 20:29    ASSESSMENT AND PLAN:   Active Problems:   AKI (acute kidney injury) (HCC)   Hyperkalemia   Hyponatremia   Septic shock (HCC)   Pressure injury of skin  Collene SchlichterRobert Higgins  is a 61 y.o. male with a known history of diverticulitis, hypertension, tobacco abuse is presenting to the ED with a chief complaint of dry heaves, poor by mouth intake for almost 1 week. Blood pressure was soft , and patient's shortness of breath was completely resolved after giving breathing treatments. Sodium was low at 118 and creatinine is at 1.8. Patient denies any abdominal pain or vomiting.  # Hyponatremia from dehydration, hypovolemia  Gentle hydration with IV fluids-normal saline , Sodium quickly came up to 130, so nephrologist suggest to give Dextrose IV,  follow serial Serial sodiums- now dropped again, so switch back to NS. expected random urine osmolality and lites, not signs of SIADH Nephrology consult appreciated. Sent TSH.  #dry heaving causing dehydration provide supportive treatment with IV fluids, antiemetics and PPI  #acute kidney injury from dehydration-prerenal IV fluids Avoid nephrotoxins Holding lisinopril home medication Monitor renal function closely, improving.  #mild COPD exacerbation Prednisone duonebs q 6hrs Albuterol q 4 hs prn   #Diabetes mellitus Sliding scale insulin Low hemoglobin A1c  #essential hypertension Currently patient is hypotensive Hold home medications for hypertension Including lisinopril  required Levophed Drip, now off,  but SBP is still < 80.  #tobacco abuse disorder Counseled patient to quit smoking for 4 minutes, patient says that he is not ready but agreeable with the nicotine patch during hospital course  # fantom pain on right LL stump   Local pain meds cream.  GI prophylaxis with Protonix DVT prophylaxis with Lovenox subcutaneous   All the records are reviewed and case discussed with Care Management/Social Workerr. Management plans discussed with the patient, family and they are in agreement.  CODE STATUS: Full.  TOTAL TIME TAKING CARE OF THIS PATIENT: 35 minutes.    POSSIBLE D/C IN 1-2 DAYS, DEPENDING ON CLINICAL CONDITION.   Altamese DillingVACHHANI, Darran Gabay M.D on 03/10/2017   Between 7am to 6pm - Pager - [phone]  After 6pm go to [email or web address] - password [email or web address]  [facility]  Office  [phone]  CC: Primary care physician; Armando GangLindley, Cheryl P, FNP  Note: This dictation was prepared with Dragon dictation along with smaller phrase technology. Any transcriptional errors that result from this process are unintentional.

## 2017-03-10 NOTE — Progress Notes (Signed)
PT Cancellation Note  Patient Details Name: Shawn SchlichterRobert Higgins MRN: [id number] DOB: 03-Mar-1956   Cancelled Treatment:    Reason Eval/Treat Not Completed: Patient not medically ready.  PT consult received.  Chart reviewed.  CCU monitor showing pt's BP to be 71/58 currently (pt admitted to hospital with hypotension).  Pt does not appear to be appropriate for PT eval at this time d/t significant hypotension.  Will re-attempt PT eval at a later date/time as medically appropriate.  Hendricks LimesEmily Marbin Olshefski, PT 03/10/17, 2:31 PM [phone]

## 2017-03-10 NOTE — Consult Note (Signed)
[facility] CONSULT NOTE    Date: 03/10/2017                  Patient Name:  Shawn SchlichterRobert Alvizo  MRN: [id number]  DOB: 03/12/1956  Age / Sex: 61 y.o., male         PCP: Armando GangLindley, Cheryl P, FNP                 Service Requesting Consult: Pulmonary/Critical Care                 Reason for Consult: Acute renal failure/hyponatremia            History of Present Illness: Patient is a 61 y.o. male with a PMHx of diabetes mellitus type 2, hypertension, tobacco abuse, who was admitted [facility] on 03/09/2017 for evaluation of shortness of breath. He is a rather poor historian. Patient reports that he's had rather poor by mouth intake for 1 week. He also developed shortness of breath 1-2 days prior to admission. Upon initial evaluation he was found he multiple metabolic derangts. His serum sodium was quite low at 118. Patient was started on IV fluids. His serum sodium has gone from 118-130 in a short period of time. His renal function has also significantly improved. Patient is maintain the moment.   Medications: Outpatient medications: Prescriptions Prior to Admission  Medication Sig Dispense Refill Last Dose  . acetaminophen (TYLENOL) 325 MG tablet Take 650 mg by mouth every 4 (four) hours as needed.   prn at prn  . amLODipine (NORVASC) 10 MG tablet Take 10 mg by mouth daily.   03/08/2017 at 2000  . ferrous sulfate 325 (65 FE) MG tablet Take 1 tablet by mouth daily.   03/09/2017 at 0800  . folic acid (FOLVITE) 1 MG tablet Take 1 mg by mouth daily.   03/09/2017 at 0800  . indomethacin (INDOCIN) 50 MG capsule Take 50 mg by mouth 2 (two) times daily with a meal.   03/09/2017 at 0800  . lisinopril (PRINIVIL,ZESTRIL) 20 MG tablet Take 20 mg by mouth 2 (two) times daily.   03/09/2017 at 0800  . Multiple Vitamin (MULTIVITAMIN) tablet Take 1 tablet by mouth daily.   03/09/2017 at 0800  . omeprazole (PRILOSEC) 20 MG capsule Take 1 capsule by mouth daily.   03/09/2017 at 0800  . potassium  chloride SA (K-DUR,KLOR-CON) 20 MEQ tablet Take 20 mEq by mouth daily.   03/09/2017 at 0800  . simvastatin (ZOCOR) 10 MG tablet Take 1 tablet by mouth daily.   03/08/2017 at 2000  . thiamine (VITAMIN B-1) 100 MG tablet Take 100 mg by mouth daily.   03/09/2017 at 0800  . [EXPIRED] sulfamethoxazole-trimethoprim (BACTRIM DS,SEPTRA DS) 800-160 MG tablet Take 1 tablet by mouth 2 (two) times daily.   Completed Course at Unknown time    Current medications: Current Facility-Administered Medications  Medication Dose Route Frequency Provider Last Rate Last Dose  . 0.9 %  sodium chloride infusion   Intravenous Continuous Gouru, Aruna, MD 75 mL/hr at 03/10/17 0628 1,000 mL at 03/10/17 0628  . acetaminophen (TYLENOL) tablet 650 mg  650 mg Oral Q6H PRN Gouru, Aruna, MD      . albuterol (PROVENTIL) (2.5 MG/3ML) 0.083% nebulizer solution 2.5 mg  2.5 mg Nebulization Q4H PRN Gouru, Aruna, MD      . amLODipine (NORVASC) tablet 10 mg  10 mg Oral Daily Gouru, Aruna, MD      . enoxaparin (LOVENOX) injection 40 mg  40 mg Subcutaneous Q24H Gouru, Aruna, MD   40 mg at 03/10/17 0309  . ferrous sulfate tablet 325 mg  325 mg Oral Daily Gouru, Aruna, MD      . folic acid (FOLVITE) tablet 1 mg  1 mg Oral Daily Gouru, Aruna, MD      . indomethacin (INDOCIN) capsule 50 mg  50 mg Oral BID WC Gouru, Aruna, MD   50 mg at 03/10/17 0757  . insulin aspart (novoLOG) injection 0-15 Units  0-15 Units Subcutaneous TID WC Varughese, Bincy S, NP      . ipratropium-albuterol (DUONEB) 0.5-2.5 (3) MG/3ML nebulizer solution 3 mL  3 mL Nebulization Q6H Gouru, Aruna, MD   3 mL at 03/10/17 0730  . multivitamin with minerals tablet 1 tablet  1 tablet Oral Daily Gouru, Aruna, MD      . nicotine (NICODERM CQ - dosed in mg/24 hours) patch 14 mg  14 mg Transdermal Daily Gouru, Aruna, MD      . norepinephrine (LEVOPHED) 4 mg in dextrose 5 % 250 mL (0.016 mg/mL) infusion  0-40 mcg/min Intravenous Titrated Varughese, Bincy S, NP 22.5 mL/hr at 03/10/17  0700 6 mcg/min at 03/10/17 0700  . ondansetron (ZOFRAN) tablet 4 mg  4 mg Oral Q6H PRN Gouru, Aruna, MD       Or  . ondansetron (ZOFRAN) injection 4 mg  4 mg Intravenous Q6H PRN Gouru, Aruna, MD      . pantoprazole (PROTONIX) EC tablet 40 mg  40 mg Oral Daily Gouru, Aruna, MD      . polyethylene glycol (MIRALAX / GLYCOLAX) packet 17 g  17 g Oral Daily PRN Gouru, Aruna, MD      . potassium chloride SA (K-DUR,KLOR-CON) CR tablet 20 mEq  20 mEq Oral Daily Gouru, Aruna, MD      . predniSONE (DELTASONE) tablet 50 mg  50 mg Oral Q breakfast Gouru, Aruna, MD   50 mg at 03/10/17 0757  . simvastatin (ZOCOR) tablet 10 mg  10 mg Oral Daily Gouru, Aruna, MD      . sodium chloride 0.9 % bolus 2,000 mL  2,000 mL Intravenous Once Erin Fulling, MD 1,000 mL/hr at 03/10/17 0830 2,000 mL at 03/10/17 0830  . sodium polystyrene (KAYEXALATE) 15 GM/60ML suspension 15 g  15 g Oral Once Gouru, Aruna, MD      . sulfamethoxazole-trimethoprim (BACTRIM DS,SEPTRA DS) 800-160 MG per tablet 1 tablet  1 tablet Oral BID Ramonita Lab, MD   1 tablet at 03/10/17 0310  . thiamine (VITAMIN B-1) tablet 100 mg  100 mg Oral Daily Gouru, Aruna, MD          Allergies: Allergies  Allergen Reactions  . Cephalosporins   . Penicillins       Past Medical History: Past Medical History:  Diagnosis Date  . Diabetes mellitus without complication (HCC)   . Hypertension      Past Surgical History: Past Surgical History:  Procedure Laterality Date  . ABOVE KNEE LEG AMPUTATION Right   . COLOSTOMY    . shoulder surgery       Family History: History reviewed. No pertinent family history.   Social History: Social History   Social History  . Marital status: Widowed    Spouse name: N/A  . Number of children: N/A  . Years of education: N/A   Occupational History  . Not on file.   Social History Main Topics  . Smoking status: Current Every Day Smoker    Packs/day: 0.50  Types: Cigarettes  . Smokeless tobacco: Former  Neurosurgeon  . Alcohol use Yes  . Drug use: No  . Sexual activity: No   Other Topics Concern  . Not on file   Social History Narrative  . No narrative on file     Review of Systems: Poor historian, cannot accurately relate review of systems  Vital Signs: Blood pressure 96/74, pulse (!) 122, temperature 98.1 F (36.7 C), temperature source Oral, resp. rate 20, height 5\' 8"  (1.727 m), weight 67 kg (147 lb 11.3 oz), SpO2 100 %.  Weight trends: Ceasar Mons Weights   03/09/17 1838 03/09/17 2337  Weight: 77.1 kg (170 lb) 67 kg (147 lb 11.3 oz)    Physical Exam: General: Thin male, critically ill appearing  Head: Normocephalic, atraumatic.  Eyes: Anicteric, EOMI  Nose: Mucous membranes moist, not inflammed, nonerythematous.  Throat: Oropharynx nonerythematous, no exudate appreciated.   Neck: Supple, trachea midline.  Lungs:  Normal respiratory effort. Clear to auscultation BL without crackles or wheezes.  Heart: S1S2 no rubs.  Abdomen:  Soft NTND, BS present, colostomy in place  Extremities: R AKA, no edema in LLE  Neurologic: A&O X3, Motor strength is 5/5 in the all 4 extremities  Skin: No visible rashes, scars.    Lab results: Basic Metabolic Panel:  Recent Labs Lab 03/09/17 1852 03/09/17 2336 03/10/17 0321  NA 118* 126* 130*  K 5.2*  --  4.6  CL 91*  --  104  CO2 17*  --  21*  GLUCOSE 81  --  128*  BUN 10  --  8  CREATININE 1.81*  --  1.25*  CALCIUM 8.7*  --  8.1*    Liver Function Tests:  Recent Labs Lab 03/09/17 1852 03/10/17 0321  AST 37 32  ALT 12* 11*  ALKPHOS 127* 106  BILITOT 0.8 0.9  PROT 7.1 6.6  ALBUMIN 3.1* 2.8*   No results for input(s): LIPASE, AMYLASE in the last 168 hours. No results for input(s): AMMONIA in the last 168 hours.  CBC:  Recent Labs Lab 03/09/17 1852 03/10/17 0321  WBC 6.2 5.8  NEUTROABS 5.1  --   HGB 11.8* 11.3*  HCT 34.6* 33.0*  MCV 90.8 91.1  PLT 209 202    Cardiac Enzymes:  Recent Labs Lab 03/09/17 1852   TROPONINI <0.03    BNP: Invalid input(s): POCBNP  CBG:  Recent Labs Lab 03/09/17 2142 03/09/17 2156 03/09/17 2319 03/10/17 0738  GLUCAP 28* 161* 166* 92    Microbiology: Results for orders placed or performed during the hospital encounter of 03/09/17  MRSA PCR Screening     Status: None   Collection Time: 03/09/17 11:28 PM  Result Value Ref Range Status   MRSA by PCR NEGATIVE NEGATIVE Final    Comment:        The GeneXpert MRSA Assay (FDA approved for NASAL specimens only), is one component of a comprehensive MRSA colonization surveillance program. It is not intended to diagnose MRSA infection nor to guide or monitor treatment for MRSA infections.   Culture, blood (Routine X 2) w Reflex to ID Panel     Status: None (Preliminary result)   Collection Time: 03/09/17 11:57 PM  Result Value Ref Range Status   Specimen Description BLOOD LT HAND  Final   Special Requests   Final    BOTTLES DRAWN AEROBIC AND ANAEROBIC Blood Culture adequate volume   Culture NO GROWTH < 12 HOURS  Final   Report Status PENDING  Incomplete  Culture,  blood (Routine X 2) w Reflex to ID Panel     Status: None (Preliminary result)   Collection Time: 03/09/17 11:57 PM  Result Value Ref Range Status   Specimen Description BLOOD RT HAND  Final   Special Requests   Final    BOTTLES DRAWN AEROBIC AND ANAEROBIC Blood Culture adequate volume   Culture NO GROWTH < 12 HOURS  Final   Report Status PENDING  Incomplete    Coagulation Studies: No results for input(s): LABPROT, INR in the last 72 hours.  Urinalysis: No results for input(s): COLORURINE, LABSPEC, PHURINE, GLUCOSEU, HGBUR, BILIRUBINUR, KETONESUR, PROTEINUR, UROBILINOGEN, NITRITE, LEUKOCYTESUR in the last 72 hours.  Invalid input(s): APPERANCEUR    Imaging: Dg Chest 2 View  Result Date: 03/09/2017 CLINICAL DATA:  Shortness of breath since earlier this afternoon, hypoglycemia. EXAM: CHEST  2 VIEW COMPARISON:  12/22/2010 FINDINGS:  Enlargement of cardiac silhouette. Atherosclerotic calcification aorta. Mediastinal contours and pulmonary vascularity normal. Emphysematous and bronchitic changes consistent with COPD. New elevation of the LEFT diaphragm with significant LEFT basilar atelectasis. Streaky atelectasis at RIGHT base as well. Upper lungs clear. No pleural effusion or pneumothorax. Bones demineralized with evidence of prior cervical spine fusion and proximal LEFT humeral ORIF. IMPRESSION: Bibasilar atelectasis greater on LEFT with new elevation of the LEFT diaphragm. Underlying emphysematous and bronchitic changes. Mild enlargement of cardiac silhouette. Electronically Signed   By: Ulyses Southward M.D.   On: 03/09/2017 20:29      Assessment & Plan: Pt is a 61 y.o. male with a PMHx of diabetes mellitus type 2, hypertension, tobacco abuse, who was admitted to [facility] on 03/09/2017 for evaluation of shortness of breath.  1. Acute renal failure. Suspect secondary to prolonged dehydration. IV fluid hydration has been stopped given rapid rise in serum sodium.  Check renal ultrasound to make sure there is no underlying obstruction.  In addition check urinalysis as well as urine protein to creatinine ratio. No indication for dialysis at the moment.  2. Hyponatremia. Initial serum sodium was 118. Serum sodium up to 130. Given rapid rise we will start the patient on D5W at25 cc per hour. Continue to monitor serum sodium closely.  3. Metabolic/lactic acidosis. Suspect secondary to hypoperfusion.Serum bicarbonate improved to 21 this a.m. Continue to monitor.  4. Thanks for consultation.

## 2017-03-10 NOTE — Progress Notes (Signed)
Sbp 50's Repeated in both arms and with new cuff. Pt Alert and asymptomatic.  NSR in 90's. Dr Belia HemanKASA aware.  NS 1 L bolus started.

## 2017-03-10 NOTE — Progress Notes (Signed)
Annabelle Harmanana NP aware of lactic acid 2.2. and levophed increased to 5mcg.  250 NS bolus started.  Pt remains A&O x4. Asymptomatic with hypotension. Ate well. UOP good. No symptoms of ETOH withdrawal. Bp has been taken on both arms and with new cuff.  All read consistent

## 2017-03-10 NOTE — Progress Notes (Signed)
Dr Cherylann RatelLateef called with repeat serum sodium =125. Orders to stop D5W. States we may use NS boluses now if necessary.

## 2017-03-10 NOTE — Progress Notes (Addendum)
[facility] called and requested that I notify NP Bincy to put in order for additional Lactic Acid on pt per their protocol. Contacted NP Bincy and she stated that she didn't want to order another Lactic Acid b/c pt doesn't have fever, his WBC's don't indicate infection, he is not tachycardia, NP will continue to monitor patient closely.

## 2017-03-10 NOTE — Progress Notes (Signed)
Patient ID: Shawn SchlichterRobert Higgins, male   DOB: [birth year], 61 y.o.   MRN: [id number]  Called by nursing regarding patient with hypotension despite 3 L of normal saline as ordered by admitting physician. I discussed the case with the emergency department provider who initially saw the patient and she will start peripheral pressors as the patient is refusing placement of central line. I did discuss the risks, benefits and alternatives of central line placement however he still refuses. Norepinephrine was initiated in the emergency department with minimal effect on his blood pressure. I did request a transfer to the intensive care unit and spoke with both NP Bincy and Dr. Jamison NeighborNestor of [facility] who agree and accept the admission to stepdown for hypotension requiring central line and pressors.  Monitor closely.

## 2017-03-10 NOTE — Clinical Social Work Note (Signed)
Clinical Social Work Assessment  Patient Details  Name: [name] MRN: [id number] Date of Birth: [birth year]  Date of referral:  03/10/17               Reason for consult:  Discharge Planning                Permission sought to share information with:  Facility Art therapist granted to share information::  Yes, Verbal Permission Granted  Name::        Agency::     Relationship::     Contact Information:     Housing/Transportation Living arrangements for the past 2 months:  Group Home Source of Information:  Patient Patient Interpreter Needed:  None Criminal Activity/Legal Involvement Pertinent to Current Situation/Hospitalization:  No - Comment as needed Significant Relationships:  Other(Comment) (step son) Lives with:  Facility Resident Do you feel safe going back to the place where you live?  Yes Need for family participation in patient care:  No (Coment)  Care giving concerns:   Patient is a long term resident at [facility].   Social Worker assessment / plan:  Patient/Family's Understanding of and Emotional Response to Diagnosis, Current Treatment, and Prognosis:  CSW met with patient this afternoon and explained role and purpose of visit. Patient is alert and oriented X4. Patient states he has lived at [facility] since 2010. He states that he has a step son that is able to come and visit. Patient reports that he does wish to return to the family care home. Patient states that he was having difficulty breathing and that he was getting worse and needed to be admitted. 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 CSW provided supportive listening.   Employment status:  Disabled (Comment on whether or not currently receiving Disability) Insurance information:  Medicaid [place] PT Recommendations:  Not assessed at this time Information / Referral to community resources:      Patient/Family's Response to care: Patient expressed appreciation for CSW visit.    Emotional Assessment-  Patient appears to be aware of his medical situation at this time and is hoping he will be able to get some sleep and return home soon. Appearance:  Appears stated age Attitude/Demeanor/Rapport:   (pleasant and cooperative) Affect (typically observed):  Accepting, Adaptable Orientation:  Oriented to Self, Oriented to Place, Oriented to  Time, Oriented to Situation Alcohol / Substance use:  Not Applicable Psych involvement (Current and /or in the community):  No (Comment)  Discharge Needs  Concerns to be addressed:  Care Coordination Readmission within the last 30 days:  No Current discharge risk:  None Barriers to Discharge:  No Barriers Identified   Shela Leff, LCSW 03/10/2017, 12:38 PM

## 2017-03-11 LAB — GLUCOSE, CAPILLARY
GLUCOSE-CAPILLARY: 131 mg/dL — AB (ref 65–99)
Glucose-Capillary: 159 mg/dL — ABNORMAL HIGH (ref 65–99)
Glucose-Capillary: 160 mg/dL — ABNORMAL HIGH (ref 65–99)

## 2017-03-11 LAB — BASIC METABOLIC PANEL
ANION GAP: 5 (ref 5–15)
BUN: 7 mg/dL (ref 6–20)
CALCIUM: 8.4 mg/dL — AB (ref 8.9–10.3)
CO2: 21 mmol/L — ABNORMAL LOW (ref 22–32)
Chloride: 105 mmol/L (ref 101–111)
Creatinine, Ser: 0.88 mg/dL (ref 0.61–1.24)
GFR calc Af Amer: >60 mL/min (ref >60)
GLUCOSE: 115 mg/dL — AB (ref 65–99)
Potassium: 4.3 mmol/L (ref 3.5–5.1)
SODIUM: 131 mmol/L — AB (ref 135–145)

## 2017-03-11 LAB — CBC
HCT: 34.4 % — ABNORMAL LOW (ref 40.0–52.0)
Hemoglobin: 11.8 g/dL — ABNORMAL LOW (ref 13.0–18.0)
MCH: 31.2 pg (ref 26.0–34.0)
MCHC: 34.2 g/dL (ref 32.0–36.0)
MCV: 91.4 fL (ref 80.0–100.0)
PLATELETS: 203 10*3/uL (ref 150–440)
RBC: 3.77 MIL/uL — ABNORMAL LOW (ref 4.40–5.90)
RDW: 14.6 % — AB (ref 11.5–14.5)
WBC: 8.5 10*3/uL (ref 3.8–10.6)

## 2017-03-11 LAB — PROCALCITONIN

## 2017-03-11 LAB — PHOSPHORUS: Phosphorus: 2.3 mg/dL — ABNORMAL LOW (ref 2.5–4.6)

## 2017-03-11 LAB — SODIUM: Sodium: 131 mmol/L — ABNORMAL LOW (ref 135–145)

## 2017-03-11 LAB — HIV ANTIBODY (ROUTINE TESTING W REFLEX): HIV SCREEN 4TH GENERATION: NONREACTIVE

## 2017-03-11 LAB — MAGNESIUM: MAGNESIUM: 1.5 mg/dL — AB (ref 1.7–2.4)

## 2017-03-11 LAB — LACTIC ACID, PLASMA: Lactic Acid, Venous: 2 mmol/L (ref 0.5–1.9)

## 2017-03-11 MED ORDER — CALCIUM CARBONATE ANTACID 500 MG PO CHEW
2.0000 | CHEWABLE_TABLET | Freq: Once | ORAL | Status: AC
  Administered 2017-03-11: 400 mg via ORAL
  Filled 2017-03-11: qty 2

## 2017-03-11 MED ORDER — IPRATROPIUM-ALBUTEROL 0.5-2.5 (3) MG/3ML IN SOLN: 3.0000 mL | Freq: Four times a day (QID) | RESPIRATORY_TRACT | Status: DC | PRN

## 2017-03-11 MED ORDER — K PHOS MONO-SOD PHOS DI & MONO 155-852-130 MG PO TABS
500.0000 mg | ORAL_TABLET | Freq: Three times a day (TID) | ORAL | Status: AC
  Administered 2017-03-11 (×3): 500 mg via ORAL
  Filled 2017-03-11 (×3): qty 2

## 2017-03-11 MED ORDER — MAGNESIUM SULFATE 2 GM/50ML IV SOLN
2.0000 g | Freq: Once | INTRAVENOUS | Status: AC
  Administered 2017-03-11: 2 g via INTRAVENOUS
  Filled 2017-03-11: qty 50

## 2017-03-11 MED ORDER — INSULIN ASPART 100 UNIT/ML ~~LOC~~ SOLN
0.0000 [IU] | Freq: Three times a day (TID) | SUBCUTANEOUS | Status: DC
  Administered 2017-03-12: 7 [IU] via SUBCUTANEOUS
  Filled 2017-03-11: qty 1

## 2017-03-11 MED ORDER — SODIUM CHLORIDE 0.9 % IV SOLN
INTRAVENOUS | Status: DC
  Administered 2017-03-11 – 2017-03-12 (×2): via INTRAVENOUS

## 2017-03-11 MED ORDER — PNEUMOCOCCAL VAC POLYVALENT 25 MCG/0.5ML IJ INJ
0.5000 mL | INJECTION | INTRAMUSCULAR | Status: AC
  Administered 2017-03-12: 0.5 mL via INTRAMUSCULAR
  Filled 2017-03-11 (×2): qty 0.5

## 2017-03-11 MED ORDER — INSULIN ASPART 100 UNIT/ML ~~LOC~~ SOLN: 0.0000 [IU] | Freq: Every day | SUBCUTANEOUS | Status: DC

## 2017-03-11 MED ORDER — ENOXAPARIN SODIUM 40 MG/0.4ML ~~LOC~~ SOLN
40.0000 mg | SUBCUTANEOUS | Status: DC
  Administered 2017-03-11 – 2017-03-12 (×2): 40 mg via SUBCUTANEOUS
  Filled 2017-03-11 (×2): qty 0.4

## 2017-03-11 NOTE — Plan of Care (Signed)
Problem: Activity: Goal: Risk for activity intolerance will decrease Outcome: Not Progressing Pt remains on levophed at this time to maintain map>60. Pt on 3L o2 via [place], A&Ox6, afebrile, and remains in sinus tachycardia. Pt c/o pain in left stump and reported adequate relief with PRN Tylenol. Pt with good PO intake and good urine output. Pt refusing to allow repositioning stating he is uncomfortable with a pillow under hips at this time. Pt refused to get OOB with PT and states he will attempt again tomorrow. CIWA scores are zero and no s/s of anxiety at this time. Lactate 2.0, MDs aware. Will continue to monitor.

## 2017-03-11 NOTE — Progress Notes (Signed)
Clinical Child psychotherapistocial Worker (CSW) received ALF placement consult. Weekday CSW completed assessment, [facility].   [employer] Hughes IncorporatedBailey Jamella Grayer, LCSW [phone]

## 2017-03-11 NOTE — Progress Notes (Signed)
[facility]  ROUNDING NOTE   Subjective:  Patient seen at bedside this a.m. Renal function has improved. Serum sodium up to 131. Patient now on 0.9 normal saline at 50 cc per hour. Serum bicarbonate stable at 21. Patient also tachycardic at the moment. Good urine output of 2.6 L over the preceding 24 hours.   Objective:  Vital signs in last 24 hours:  Temp:  [97.3 F (36.3 C)-99.9 F (37.7 C)] 99 F (37.2 C) (09/01 0330) Pulse Rate:  [79-144] 121 (09/01 0700) Resp:  [16-33] 22 (09/01 0700) BP: (54-131)/(39-97) 98/74 (09/01 0700) SpO2:  [87 %-100 %] 96 % (09/01 0700)  Weight change:  Filed Weights   03/09/17 1838 03/09/17 2337  Weight: 77.1 kg (170 lb) 67 kg (147 lb 11.3 oz)    Intake/Output: I/O last 3 completed shifts: In: 7290.8 [P.O.:720; I.V.:1370.8; IV Piggyback:5200] Out: 4850 [Urine:4850]   Intake/Output this shift:  No intake/output data recorded.  Physical Exam: General: No acute distress  Head: Normocephalic, atraumatic. Moist oral mucosal membranes  Eyes: Anicteric  Neck: Supple, trachea midline  Lungs:  Clear to auscultation, normal effort  Heart: S1S2 tachycardic  Abdomen:  Soft, nontender, bowel sounds present, colostomy present  Extremities: R AKA  Neurologic: Awake, alert, following commands  Skin: No lesions       Basic Metabolic Panel:  Recent Labs Lab 03/09/17 1852  03/10/17 0321  03/10/17 1646 03/10/17 1948 03/10/17 2119 03/11/17 0138 03/11/17 0607  NA 118*  < > 130*  < > 127* 128* 130* 131* 131*  K 5.2*  --  4.6  --   --   --   --  4.3  --   CL 91*  --  104  --   --   --   --  105  --   CO2 17*  --  21*  --   --   --   --  21*  --   GLUCOSE 81  --  128*  --   --   --   --  115*  --   BUN 10  --  8  --   --   --   --  7  --   CREATININE 1.81*  --  1.25*  --   --   --   --  0.88  --   CALCIUM 8.7*  --  8.1*  --   --   --   --  8.4*  --   MG  --   --   --   --   --   --   --  1.5*  --   PHOS  --   --   --   --    --   --   --  2.3*  --   < > = values in this interval not displayed.  Liver Function Tests:  Recent Labs Lab 03/09/17 1852 03/10/17 0321  AST 37 32  ALT 12* 11*  ALKPHOS 127* 106  BILITOT 0.8 0.9  PROT 7.1 6.6  ALBUMIN 3.1* 2.8*   No results for input(s): LIPASE, AMYLASE in the last 168 hours. No results for input(s): AMMONIA in the last 168 hours.  CBC:  Recent Labs Lab 03/09/17 1852 03/10/17 0321 03/11/17 0138  WBC 6.2 5.8 8.5  NEUTROABS 5.1  --   --   HGB 11.8* 11.3* 11.8*  HCT 34.6* 33.0* 34.4*  MCV 90.8 91.1 91.4  PLT 209 202 203  Cardiac Enzymes:  Recent Labs Lab 03/09/17 1852  TROPONINI <0.03    BNP: Invalid input(s): POCBNP  CBG:  Recent Labs Lab 03/10/17 1135 03/10/17 1619 03/10/17 2205 03/10/17 2341 03/11/17 0600  GLUCAP 245* 267* 210* 166* 131*    Microbiology: Results for orders placed or performed during the hospital encounter of 03/09/17  MRSA PCR Screening     Status: None   Collection Time: 03/09/17 11:28 PM  Result Value Ref Range Status   MRSA by PCR NEGATIVE NEGATIVE Final    Comment:        The GeneXpert MRSA Assay (FDA approved for NASAL specimens only), is one component of a comprehensive MRSA colonization surveillance program. It is not intended to diagnose MRSA infection nor to guide or monitor treatment for MRSA infections.   Culture, blood (Routine X 2) w Reflex to ID Panel     Status: None (Preliminary result)   Collection Time: 03/09/17 11:57 PM  Result Value Ref Range Status   Specimen Description BLOOD LT HAND  Final   Special Requests   Final    BOTTLES DRAWN AEROBIC AND ANAEROBIC Blood Culture adequate volume   Culture NO GROWTH 1 DAY  Final   Report Status PENDING  Incomplete  Culture, blood (Routine X 2) w Reflex to ID Panel     Status: None (Preliminary result)   Collection Time: 03/09/17 11:57 PM  Result Value Ref Range Status   Specimen Description BLOOD RT HAND  Final   Special Requests    Final    BOTTLES DRAWN AEROBIC AND ANAEROBIC Blood Culture adequate volume   Culture NO GROWTH 1 DAY  Final   Report Status PENDING  Incomplete    Coagulation Studies: No results for input(s): LABPROT, INR in the last 72 hours.  Urinalysis: No results for input(s): COLORURINE, LABSPEC, PHURINE, GLUCOSEU, HGBUR, BILIRUBINUR, KETONESUR, PROTEINUR, UROBILINOGEN, NITRITE, LEUKOCYTESUR in the last 72 hours.  Invalid input(s): APPERANCEUR    Imaging: Dg Chest 2 View  Result Date: 03/09/2017 CLINICAL DATA:  Shortness of breath since earlier this afternoon, hypoglycemia. EXAM: CHEST  2 VIEW COMPARISON:  12/22/2010 FINDINGS: Enlargement of cardiac silhouette. Atherosclerotic calcification aorta. Mediastinal contours and pulmonary vascularity normal. Emphysematous and bronchitic changes consistent with COPD. New elevation of the LEFT diaphragm with significant LEFT basilar atelectasis. Streaky atelectasis at RIGHT base as well. Upper lungs clear. No pleural effusion or pneumothorax. Bones demineralized with evidence of prior cervical spine fusion and proximal LEFT humeral ORIF. IMPRESSION: Bibasilar atelectasis greater on LEFT with new elevation of the LEFT diaphragm. Underlying emphysematous and bronchitic changes. Mild enlargement of cardiac silhouette. Electronically Signed   By: Ulyses SouthwardMark  Boles M.D.   On: 03/09/2017 20:29     Medications:   . magnesium sulfate 1 - 4 g bolus IVPB 2 g (03/11/17 0845)  . norepinephrine (LEVOPHED) Adult infusion 7 mcg/min (03/11/17 0300)   . amLODipine  10 mg Oral Daily  . enoxaparin (LOVENOX) injection  40 mg Subcutaneous Q24H  . ferrous sulfate  325 mg Oral Daily  . folic acid  1 mg Oral Daily  . insulin aspart  0-20 Units Subcutaneous Q6H  . ipratropium-albuterol  3 mL Nebulization Q6H  . multivitamin with minerals  1 tablet Oral Daily  . nicotine  14 mg Transdermal Daily  . pantoprazole  40 mg Oral Daily  . phosphorus  500 mg Oral TID  . [START ON  03/12/2017] pneumococcal 23 valent vaccine  0.5 mL Intramuscular Tomorrow-1000  . potassium chloride SA  20 mEq Oral Daily  . predniSONE  50 mg Oral Q breakfast  . simvastatin  10 mg Oral Daily  . sodium polystyrene  15 g Oral Once  . sulfamethoxazole-trimethoprim  1 tablet Oral BID  . thiamine  100 mg Oral Daily   acetaminophen, albuterol, MUSCLE RUB, ondansetron **OR** ondansetron (ZOFRAN) IV, polyethylene glycol  Assessment/ Plan:  61 y.o. male with a PMHx of diabetes mellitus type 2, hypertension, tobacco abuse, who was admitted to [facility] on 03/09/2017 for evaluation of shortness of breath.  1. Acute renal failure. Suspect secondary to prolonged dehydration and NSAIDs possibly. Renal function has significantly improved.  Continue IV fluid hydration with 0.9 normal saline. Continue to monitor renal parameters daily. Avoid nephrotoxins as possible.  2. Hyponatremia. Initial serum sodium was 118. Serum sodium up to 131 at the moment. Continue 0.9 normal saline.  3. Metabolic/lactic acidosis.  Serum bicarbonate currently 21. Continue IV fluid hydration to support circulation.  4.  Hypotension:  Continue norepinephrine to maintain a map of 65 or greater.  Wean as tolerated today.    LOS: 2 Ellise Kovack 9/1/20189:42 AM

## 2017-03-11 NOTE — Consult Note (Signed)
MEDICATION RELATED CONSULT NOTE - INITIAL   Pharmacy Consult for electrolytes Indication: hypomag, hypophos  Allergies  Allergen Reactions  . Cephalosporins   . Penicillins     Patient Measurements: Height: 5\' 8"  (172.7 cm) Weight: 147 lb 11.3 oz (67 kg) IBW/kg (Calculated) : 68.4 Adjusted Body Weight:   Vital Signs: Temp: 99 F (37.2 C) (09/01 0330) Temp Source: Oral (09/01 0330) BP: 98/75 (09/01 0600) Pulse Rate: 118 (09/01 0600) Intake/Output from previous day: 08/31 0701 - 09/01 0700 In: 3817.6 [P.O.:720; I.V.:1097.6; IV Piggyback:2000] Out: 2100 [Urine:2100] Intake/Output from this shift: No intake/output data recorded.  Labs:  Recent Labs  03/09/17 1852 03/10/17 0321 03/11/17 0138  WBC 6.2 5.8 8.5  HGB 11.8* 11.3* 11.8*  HCT 34.6* 33.0* 34.4*  PLT 209 202 203  CREATININE 1.81* 1.25* 0.88  MG  --   --  1.5*  PHOS  --   --  2.3*  ALBUMIN 3.1* 2.8*  --   PROT 7.1 6.6  --   AST 37 32  --   ALT 12* 11*  --   ALKPHOS 127* 106  --   BILITOT 0.8 0.9  --   BILIDIR 0.2  --   --   IBILI 0.6  --   --    Estimated Creatinine Clearance: 81.4 mL/min (by C-G formula based on SCr of 0.88 mg/dL).   Microbiology: Recent Results (from the past 720 hour(s))  MRSA PCR Screening     Status: None   Collection Time: 03/09/17 11:28 PM  Result Value Ref Range Status   MRSA by PCR NEGATIVE NEGATIVE Final    Comment:        The GeneXpert MRSA Assay (FDA approved for NASAL specimens only), is one component of a comprehensive MRSA colonization surveillance program. It is not intended to diagnose MRSA infection nor to guide or monitor treatment for MRSA infections.   Culture, blood (Routine X 2) w Reflex to ID Panel     Status: None (Preliminary result)   Collection Time: 03/09/17 11:57 PM  Result Value Ref Range Status   Specimen Description BLOOD LT HAND  Final   Special Requests   Final    BOTTLES DRAWN AEROBIC AND ANAEROBIC Blood Culture adequate volume   Culture NO GROWTH 1 DAY  Final   Report Status PENDING  Incomplete  Culture, blood (Routine X 2) w Reflex to ID Panel     Status: None (Preliminary result)   Collection Time: 03/09/17 11:57 PM  Result Value Ref Range Status   Specimen Description BLOOD RT HAND  Final   Special Requests   Final    BOTTLES DRAWN AEROBIC AND ANAEROBIC Blood Culture adequate volume   Culture NO GROWTH 1 DAY  Final   Report Status PENDING  Incomplete    Medical History: Past Medical History:  Diagnosis Date  . Diabetes mellitus without complication (HCC)   . Hypertension     Medications:  Scheduled:  . amLODipine  10 mg Oral Daily  . enoxaparin (LOVENOX) injection  40 mg Subcutaneous Q24H  . ferrous sulfate  325 mg Oral Daily  . folic acid  1 mg Oral Daily  . insulin aspart  0-20 Units Subcutaneous Q6H  . ipratropium-albuterol  3 mL Nebulization Q6H  . multivitamin with minerals  1 tablet Oral Daily  . nicotine  14 mg Transdermal Daily  . pantoprazole  40 mg Oral Daily  . phosphorus  500 mg Oral TID  . potassium chloride SA  20 mEq  Oral Daily  . predniSONE  50 mg Oral Q breakfast  . simvastatin  10 mg Oral Daily  . sodium polystyrene  15 g Oral Once  . sulfamethoxazole-trimethoprim  1 tablet Oral BID  . thiamine  100 mg Oral Daily    Assessment: Patient is a 61 year old male who is admitted with the chief complaint of dry heaves, poor by mouth intake for almost 1 week. Pt also found to have AKI which is resolving. Pt was found to have a Na of 118 which was corrected quickly to 130. Also found to have a phos of 2.3 and mag of 1.5. K was inital elevated at 5.2, now 4.3.  Goal of Therapy:  Normalization of electrolytes  Plan:  Mag IV 2 g once, Kphos neutral 500mg  TID x 1 day. Will recheck in the AM.  Na management replacement per nephrology.  Olene Floss, Pharm.D, BCPS Clinical Pharmacist  03/11/2017,7:14 AM

## 2017-03-11 NOTE — Progress Notes (Signed)
[employer] [facility] [facility] [place] [facility]   PATIENT NAME: Shawn SchlichterRobert Higgins    MR#:  [id number]  DATE OF BIRTH:  29-May-1956  SUBJECTIVE:  CHIEF COMPLAINT:   Chief Complaint  Patient presents with  . Shortness of Breath    Came with SOB complains. Have dry heaves- found to have renal failure and low sodium.  Improved sodium very quickly, but dropped again after changing to Dextrose drip. He said his BP always runs low, have no symptoms. On levophed drip, sodium improving.  REVIEW OF SYSTEMS:  CONSTITUTIONAL: No fever, fatigue or weakness.  EYES: No blurred or double vision.  EARS, NOSE, AND THROAT: No tinnitus or ear pain.  RESPIRATORY: No cough, shortness of breath, wheezing or hemoptysis.  CARDIOVASCULAR: No chest pain, orthopnea, edema.  GASTROINTESTINAL: No nausea, vomiting, diarrhea or abdominal pain.  GENITOURINARY: No dysuria, hematuria.  ENDOCRINE: No polyuria, nocturia,  HEMATOLOGY: No anemia, easy bruising or bleeding SKIN: No rash or lesion. MUSCULOSKELETAL: No joint pain or arthritis.   NEUROLOGIC: No tingling, numbness, weakness.  PSYCHIATRY: No anxiety or depression.   ROS  DRUG ALLERGIES:   Allergies  Allergen Reactions  . Cephalosporins   . Penicillins     VITALS:  Blood pressure 105/84, pulse (!) 118, temperature 98.2 F (36.8 C), temperature source Axillary, resp. rate (!) 21, height 5\' 8"  (1.727 m), weight 67 kg (147 lb 11.3 oz), SpO2 91 %.  PHYSICAL EXAMINATION:   GENERAL:  61 y.o.-year-old patient lying in the bed with no acute distress.  EYES: Pupils equal, round, reactive to light and accommodation. No scleral icterus. Extraocular muscles intact.  HEENT: Head atraumatic, normocephalic. Oropharynx and nasopharynx clear.  NECK:  Supple, no jugular venous distention. No thyroid enlargement, no tenderness.  LUNGS: Normal breath sounds bilaterally, no wheezing, rales,rhonchi or crepitation. No use of accessory muscles of respiration.   CARDIOVASCULAR: S1, S2 normal. No murmurs, rubs, or gallops.  ABDOMEN: Soft, nontender, nondistended. Bowel sounds present.colostomy site is intact  EXTREMITIES: No pedal edema, cyanosis, or clubbing. 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 NEUROLOGIC: Cranial nerves II through XII are intact. Muscle strength 4/5 in all extremities. Sensation intact. Gait not checked.  PSYCHIATRIC: The patient is alert and oriented x 3.  SKIN: No obvious rash, lesion, or ulcer.  Physical Exam LABORATORY PANEL:   CBC  Recent Labs Lab 03/11/17 0138  WBC 8.5  HGB 11.8*  HCT 34.4*  PLT 203   ------------------------------------------------------------------------------------------------------------------  Chemistries   Recent Labs Lab 03/10/17 0321  03/11/17 0138 03/11/17 0607  NA 130*  < > 131* 131*  K 4.6  --  4.3  --   CL 104  --  105  --   CO2 21*  --  21*  --   GLUCOSE 128*  --  115*  --   BUN 8  --  7  --   CREATININE 1.25*  --  0.88  --   CALCIUM 8.1*  --  8.4*  --   MG  --   --  1.5*  --   AST 32  --   --   --   ALT 11*  --   --   --   ALKPHOS 106  --   --   --   BILITOT 0.9  --   --   --   < > = values in this interval not displayed. ------------------------------------------------------------------------------------------------------------------  Cardiac Enzymes  Recent Labs Lab 03/09/17 1852  TROPONINI <0.03   ------------------------------------------------------------------------------------------------------------------  RADIOLOGY:  Dg Chest 2 View  Result Date: 03/09/2017 CLINICAL DATA:  Shortness of breath since earlier this afternoon, hypoglycemia. EXAM: CHEST  2 VIEW COMPARISON:  12/22/2010 FINDINGS: Enlargement of cardiac silhouette. Atherosclerotic calcification aorta. Mediastinal contours and pulmonary vascularity normal. Emphysematous and bronchitic changes consistent with COPD. New elevation of the LEFT diaphragm with significant LEFT basilar  atelectasis. Streaky atelectasis at RIGHT base as well. Upper lungs clear. No pleural effusion or pneumothorax. Bones demineralized with evidence of prior cervical spine fusion and proximal LEFT humeral ORIF. IMPRESSION: Bibasilar atelectasis greater on LEFT with new elevation of the LEFT diaphragm. Underlying emphysematous and bronchitic changes. Mild enlargement of cardiac silhouette. Electronically Signed   By: Ulyses Southward M.D.   On: 03/09/2017 20:29    ASSESSMENT AND PLAN:   Active Problems:   AKI (acute kidney injury) (HCC)   Hyperkalemia   Hyponatremia   Septic shock (HCC)   Pressure injury of skin  [name]  is a 61 y.o. male with a known history of diverticulitis, hypertension, tobacco abuse is presenting to the ED with a chief complaint of dry heaves, poor by mouth intake for almost 1 week. Blood pressure was soft , and patient's shortness of breath was completely resolved after giving breathing treatments. Sodium was low at 118 and creatinine is at 1.8. Patient denies any abdominal pain or vomiting.  # Hyponatremia from dehydration, hypovolemia  Gentle hydration with IV fluids-normal saline , Sodium quickly came up to 130, so nephrologist suggest to give Dextrose IV, again switched to NS, stable now.  follow serial Serial sodiums- now dropped again, so switch back to NS. expected random urine osmolality and lites, not signs of SIADH Nephrology consult appreciated. normal TSH. Cont gentle hydration. Na 131 now.  #dry heaving causing dehydration provide supportive treatment with IV fluids, antiemetics and PPI  # hypovolemic shock    Requiring hydration and levophed.  #acute kidney injury from dehydration-prerenal IV fluids Avoid nephrotoxins Holding lisinopril home medication Monitor renal function closely, improving.  #mild COPD exacerbation Prednisone duonebs q 6hrs Albuterol q 4 hs prn   #Diabetes mellitus Sliding scale insulin Low hemoglobin  A1c  #essential hypertension Currently patient is hypotensive Hold home medications for hypertension Including lisinopril  required Levophed Drip, now off, but SBP is still < 80.  #tobacco abuse disorder Counseled patient to quit smoking for 4 minutes, patient says that he is not ready but agreeable with the nicotine patch during hospital course  # fantom pain on right LL stump   Local pain meds cream.  GI prophylaxis with Protonix DVT prophylaxis with Lovenox subcutaneous   All the records are reviewed and case discussed with Care Management/Social Workerr. Management plans discussed with the patient, family and they are in agreement.  CODE STATUS: Full.  TOTAL TIME TAKING CARE OF THIS PATIENT: 35 minutes.    POSSIBLE D/C IN 1-2 DAYS, DEPENDING ON CLINICAL CONDITION.   Altamese Dilling M.D on 03/11/2017   Between 7am to 6pm - Pager - [phone]  After 6pm go to [email or web address] - password [email or web address]  [facility]  Office  [phone]  CC: Primary care physician; Armando Gang, FNP  Note: This dictation was prepared with Dragon dictation along with smaller phrase technology. Any transcriptional errors that result from this process are unintentional.

## 2017-03-11 NOTE — Progress Notes (Signed)
PT Cancellation Note  Patient Details Name: Collene SchlichterRobert Urquilla MRN: [id number] DOB: [birth year]   Cancelled Treatment:    Reason Eval/Treat Not Completed: Patient declined, no reason specified (Evaluation re-attempted.  Patient currently declining participation with therapy, stating he is tired and "wants to rest".  Unable to promote participation despite encouragement.  Will re-attempt next date as patient allows.)   Gerlene Glassburn H. Manson PasseyBrown, PT, DPT, NCS 03/11/17, 2:32 PM [phone]

## 2017-03-11 NOTE — Progress Notes (Signed)
eLink Physician-Brief Progress Note Patient Name: Shawn SchlichterRobert Higgins DOB: 11/16/1955 MRN: [id number]   Date of Service  03/11/2017  HPI/Events of Note  Nurse contacted regarding mild reflux. Patient also on every 6 hours Accu-Cheks with insulin. Diet currently ordered.   eICU Interventions  1. Tums 2 tablets by mouth 1 now 2. Switching Accu-Cheks every before meals & at bedtime 3. Continuing resistance sliding scale algorithm      Intervention Category Intermediate Interventions: Hyperglycemia - evaluation and treatment;Other:  Lawanda CousinsJennings Ronda Kazmi 03/11/2017, 5:42 PM

## 2017-03-11 NOTE — Progress Notes (Signed)
[facility]  Critical Care Medicine Progess Note    SYNOPSIS  61 year old male with hyponatremia ,Acute kidney injury possibly related to hypovolemia.  Now in shock and on pressors.   ASSESSMENT  Hypovolemic shock - improving Hponatremia  Acute Kidney Injury - improving Tobacco abuse ETOH abuse COPD Hypoglycemia   PLAN Wean pressors for MAP>65. On bactrim. Consider weaning off steroids over next week.  Nephrology following. DVT prophylaxis. Full Code. Son updated at bedside.  Cc: 32 minutes  Dorothyann Gibbs MD   SUBJECTIVE:  Patient seen and examined at bedside. On pressors which are being weaned down. Feels getting better and denies any chest pain or shortness of breath.     VITAL SIGNS: Temp:  [98 F (36.7 C)-99.9 F (37.7 C)] 98.2 F (36.8 C) (09/01 0800) Pulse Rate:  [79-144] 118 (09/01 1100) Resp:  [17-33] 21 (09/01 1100) BP: (54-131)/(39-97) 105/84 (09/01 1100) SpO2:  [87 %-100 %] 91 % (09/01 1100)     PHYSICAL EXAMINATION: Physical Examination:   VS: BP 105/84   Pulse (!) 118   Temp 98.2 F (36.8 C) (Axillary)   Resp (!) 21   Ht 5\' 8"  (1.727 m)   Wt 147 lb 11.3 oz (67 kg)   SpO2 91%   BMI 22.46 kg/m   General Appearance: No distress  Neuro:without focal findings, mental status normal. HEENT: PERRLA, EOM intact. Pulmonary: normal breath sounds   CardiovascularNormal S1,S2.  No m/r/g.   Abdomen: Benign, Soft, non-tender. Renal:  No costovertebral tenderness  GU:  Not performed at this time. Endocrine: No evident thyromegaly. Skin:   warm, no rashes, no ecchymosis  Extremities: normal, no cyanosis, clubbing.    LABORATORY PANEL:   CBC  Recent Labs Lab 03/11/17 0138  WBC 8.5  HGB 11.8*  HCT 34.4*  PLT 203    Chemistries   Recent Labs Lab 03/10/17 0321  03/11/17 0138 03/11/17 0607  NA 130*  < > 131* 131*  K 4.6  --  4.3  --   CL 104  --  105  --   CO2 21*  --  21*  --   GLUCOSE 128*  --  115*  --   BUN 8  --  7  --     CREATININE 1.25*  --  0.88  --   CALCIUM 8.1*  --  8.4*  --   MG  --   --  1.5*  --   PHOS  --   --  2.3*  --   AST 32  --   --   --   ALT 11*  --   --   --   ALKPHOS 106  --   --   --   BILITOT 0.9  --   --   --   < > = values in this interval not displayed.   Recent Labs Lab 03/10/17 1135 03/10/17 1619 03/10/17 2205 03/10/17 2341 03/11/17 0600 03/11/17 1158  GLUCAP 245* 267* 210* 166* 131* 159*   No results for input(s): PHART, PCO2ART, PO2ART in the last 168 hours.  Recent Labs Lab 03/09/17 1852 03/10/17 0321  AST 37 32  ALT 12* 11*  ALKPHOS 127* 106  BILITOT 0.8 0.9  ALBUMIN 3.1* 2.8*    Cardiac Enzymes  Recent Labs Lab 03/09/17 1852  TROPONINI <0.03    RADIOLOGY:  Dg Chest 2 View  Result Date: 03/09/2017 CLINICAL DATA:  Shortness of breath since earlier this afternoon, hypoglycemia. EXAM: CHEST  2 VIEW  COMPARISON:  12/22/2010 FINDINGS: Enlargement of cardiac silhouette. Atherosclerotic calcification aorta. Mediastinal contours and pulmonary vascularity normal. Emphysematous and bronchitic changes consistent with COPD. New elevation of the LEFT diaphragm with significant LEFT basilar atelectasis. Streaky atelectasis at RIGHT base as well. Upper lungs clear. No pleural effusion or pneumothorax. Bones demineralized with evidence of prior cervical spine fusion and proximal LEFT humeral ORIF. IMPRESSION: Bibasilar atelectasis greater on LEFT with new elevation of the LEFT diaphragm. Underlying emphysematous and bronchitic changes. Mild enlargement of cardiac silhouette. Electronically Signed   By: Ulyses SouthwardMark  Boles M.D.   On: 03/09/2017 20:29           03/11/2017

## 2017-03-12 ENCOUNTER — Inpatient Hospital Stay: Payer: Medicaid Other

## 2017-03-12 LAB — BASIC METABOLIC PANEL
ANION GAP: 5 (ref 5–15)
BUN: 9 mg/dL (ref 6–20)
CALCIUM: 8.3 mg/dL — AB (ref 8.9–10.3)
CO2: 24 mmol/L (ref 22–32)
Chloride: 103 mmol/L (ref 101–111)
Creatinine, Ser: 0.98 mg/dL (ref 0.61–1.24)
GLUCOSE: 164 mg/dL — AB (ref 65–99)
POTASSIUM: 4.4 mmol/L (ref 3.5–5.1)
SODIUM: 132 mmol/L — AB (ref 135–145)

## 2017-03-12 LAB — GLUCOSE, CAPILLARY
GLUCOSE-CAPILLARY: 81 mg/dL (ref 65–99)
GLUCOSE-CAPILLARY: 120 mg/dL — AB (ref 65–99)
GLUCOSE-CAPILLARY: 237 mg/dL — AB (ref 65–99)
Glucose-Capillary: 139 mg/dL — ABNORMAL HIGH (ref 65–99)
Glucose-Capillary: 139 mg/dL — ABNORMAL HIGH (ref 65–99)

## 2017-03-12 LAB — MAGNESIUM: MAGNESIUM: 1.9 mg/dL (ref 1.7–2.4)

## 2017-03-12 LAB — LACTIC ACID, PLASMA: Lactic Acid, Venous: 1.6 mmol/L (ref 0.5–1.9)

## 2017-03-12 LAB — PHOSPHORUS: Phosphorus: 4 mg/dL (ref 2.5–4.6)

## 2017-03-12 MED ORDER — ALBUMIN HUMAN 25 % IV SOLN
12.5000 g | Freq: Four times a day (QID) | INTRAVENOUS | Status: AC
  Administered 2017-03-12 – 2017-03-13 (×2): 12.5 g via INTRAVENOUS
  Filled 2017-03-12 (×2): qty 50

## 2017-03-12 MED ORDER — ALBUMIN HUMAN 5 % IV SOLN
12.5000 g | Freq: Four times a day (QID) | INTRAVENOUS | Status: DC
  Administered 2017-03-12 (×2): 12.5 g via INTRAVENOUS
  Filled 2017-03-12 (×5): qty 250

## 2017-03-12 MED ORDER — SODIUM CHLORIDE 0.9 % IV BOLUS (SEPSIS)
250.0000 mL | Freq: Once | INTRAVENOUS | Status: AC
  Administered 2017-03-12: 250 mL via INTRAVENOUS

## 2017-03-12 MED ORDER — PREDNISONE 20 MG PO TABS
40.0000 mg | ORAL_TABLET | Freq: Every day | ORAL | Status: DC
  Administered 2017-03-13: 40 mg via ORAL
  Filled 2017-03-12: qty 2

## 2017-03-12 NOTE — Consult Note (Signed)
MEDICATION RELATED CONSULT NOTE - INITIAL   Pharmacy Consult for electrolytes Indication: hypomag, hypophos  Allergies  Allergen Reactions  . Cephalosporins   . Penicillins     Patient Measurements: Height: 5\' 8"  (172.7 cm) Weight: 147 lb 11.3 oz (67 kg) IBW/kg (Calculated) : 68.4 Adjusted Body Weight:   Vital Signs: Temp: 98.6 F (37 C) (09/01 2000) Temp Source: Oral (09/01 2000) BP: 96/77 (09/02 0330) Pulse Rate: 102 (09/02 0330) Intake/Output from previous day: 09/01 0701 - 09/02 0700 In: 801.8 [P.O.:240; I.V.:561.8] Out: 1750 [Urine:1550; Stool:200] Intake/Output from this shift: Total I/O In: 135 [I.V.:135] Out: -   Labs:  Recent Labs  03/09/17 1852 03/10/17 0321 03/11/17 0138 03/12/17 0328  WBC 6.2 5.8 8.5  --   HGB 11.8* 11.3* 11.8*  --   HCT 34.6* 33.0* 34.4*  --   PLT 209 202 203  --   CREATININE 1.81* 1.25* 0.88 0.98  MG  --   --  1.5* 1.9  PHOS  --   --  2.3* 4.0  ALBUMIN 3.1* 2.8*  --   --   PROT 7.1 6.6  --   --   AST 37 32  --   --   ALT 12* 11*  --   --   ALKPHOS 127* 106  --   --   BILITOT 0.8 0.9  --   --   BILIDIR 0.2  --   --   --   IBILI 0.6  --   --   --    Estimated Creatinine Clearance: 73.1 mL/min (by C-G formula based on SCr of 0.98 mg/dL).   Microbiology: Recent Results (from the past 720 hour(s))  MRSA PCR Screening     Status: None   Collection Time: 03/09/17 11:28 PM  Result Value Ref Range Status   MRSA by PCR NEGATIVE NEGATIVE Final    Comment:        The GeneXpert MRSA Assay (FDA approved for NASAL specimens only), is one component of a comprehensive MRSA colonization surveillance program. It is not intended to diagnose MRSA infection nor to guide or monitor treatment for MRSA infections.   Culture, blood (Routine X 2) w Reflex to ID Panel     Status: None (Preliminary result)   Collection Time: 03/09/17 11:57 PM  Result Value Ref Range Status   Specimen Description BLOOD LT HAND  Final   Special Requests    Final    BOTTLES DRAWN AEROBIC AND ANAEROBIC Blood Culture adequate volume   Culture NO GROWTH 1 DAY  Final   Report Status PENDING  Incomplete  Culture, blood (Routine X 2) w Reflex to ID Panel     Status: None (Preliminary result)   Collection Time: 03/09/17 11:57 PM  Result Value Ref Range Status   Specimen Description BLOOD RT HAND  Final   Special Requests   Final    BOTTLES DRAWN AEROBIC AND ANAEROBIC Blood Culture adequate volume   Culture NO GROWTH 1 DAY  Final   Report Status PENDING  Incomplete    Medical History: Past Medical History:  Diagnosis Date  . Diabetes mellitus without complication (HCC)   . Hypertension     Medications:  Scheduled:  . enoxaparin (LOVENOX) injection  40 mg Subcutaneous Q24H  . ferrous sulfate  325 mg Oral Daily  . folic acid  1 mg Oral Daily  . insulin aspart  0-20 Units Subcutaneous TID WC  . insulin aspart  0-5 Units Subcutaneous QHS  . multivitamin  with minerals  1 tablet Oral Daily  . nicotine  14 mg Transdermal Daily  . pantoprazole  40 mg Oral Daily  . pneumococcal 23 valent vaccine  0.5 mL Intramuscular Tomorrow-1000  . potassium chloride SA  20 mEq Oral Daily  . predniSONE  50 mg Oral Q breakfast  . simvastatin  10 mg Oral Daily  . sodium polystyrene  15 g Oral Once  . sulfamethoxazole-trimethoprim  1 tablet Oral BID  . thiamine  100 mg Oral Daily    Assessment: Patient is a 61 year old male who is admitted with the chief complaint of dry heaves, poor by mouth intake for almost 1 week. Pt also found to have AKI which is resolving. Pt was found to have a Na of 118 which was corrected quickly to 130. Also found to have a phos of 2.3 and mag of 1.5. K was inital elevated at 5.2, now 4.3.  Goal of Therapy:  Normalization of electrolytes  Plan:  Mag IV 2 g once, Kphos neutral 500mg  TID x 1 day. Will recheck in the AM.  Na management replacement per nephrology.  9/2 AM Mg, PO4, and K WNL. Recheck with tomorrow AM  labs.  Fulton ReekMatt Tayah Idrovo, PharmD, BCPS  03/12/17 5:45 AM

## 2017-03-12 NOTE — Progress Notes (Signed)
[employer] -  at [facility]   PATIENT NAME: [name]    MR#:  [id number]  DATE OF BIRTH:  1956-02-12  SUBJECTIVE:  CHIEF COMPLAINT:   Chief Complaint  Patient presents with  . Shortness of Breath    Came with SOB complains. Have dry heaves- found to have renal failure and low sodium.  Improved sodium very quickly, but dropped again after changing to Dextrose drip. He said his BP always runs low, have no symptoms. Off levophed drip, sodium improving. Receiving albumin today.  REVIEW OF SYSTEMS:  CONSTITUTIONAL: No fever, fatigue or weakness.  EYES: No blurred or double vision.  EARS, NOSE, AND THROAT: No tinnitus or ear pain.  RESPIRATORY: No cough, shortness of breath, wheezing or hemoptysis.  CARDIOVASCULAR: No chest pain, orthopnea, edema.  GASTROINTESTINAL: No nausea, vomiting, diarrhea or abdominal pain.  GENITOURINARY: No dysuria, hematuria.  ENDOCRINE: No polyuria, nocturia,  HEMATOLOGY: No anemia, easy bruising or bleeding SKIN: No rash or lesion. MUSCULOSKELETAL: No joint pain or arthritis.   NEUROLOGIC: No tingling, numbness, weakness.  PSYCHIATRY: No anxiety or depression.   ROS  DRUG ALLERGIES:   Allergies  Allergen Reactions  . Cephalosporins   . Penicillins     VITALS:  Blood pressure 91/69, pulse 94, temperature 98.4 F (36.9 C), temperature source Oral, resp. rate (!) 23, height 5\' 8"  (1.727 m), weight 67 kg (147 lb 11.3 oz), SpO2 95 %.  PHYSICAL EXAMINATION:   GENERAL:  61 y.o.-year-old patient lying in the bed with no acute distress.  EYES: Pupils equal, round, reactive to light and accommodation. No scleral icterus. Extraocular muscles intact.  HEENT: Head atraumatic, normocephalic. Oropharynx and nasopharynx clear.  NECK:  Supple, no jugular venous distention. No thyroid enlargement, no tenderness.  LUNGS: Normal breath sounds bilaterally, no wheezing, rales,rhonchi or crepitation. No use of accessory muscles of  respiration.  CARDIOVASCULAR: S1, S2 normal. No murmurs, rubs, or gallops.  ABDOMEN: Soft, nontender, nondistended. Bowel sounds present.colostomy site is intact  EXTREMITIES: No pedal edema, cyanosis, or clubbing. 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 NEUROLOGIC: Cranial nerves II through XII are intact. Muscle strength 4/5 in all extremities. Sensation intact. Gait not checked.  PSYCHIATRIC: The patient is alert and oriented x 3.  SKIN: No obvious rash, lesion, or ulcer.  Physical Exam LABORATORY PANEL:   CBC  Recent Labs Lab 03/11/17 0138  WBC 8.5  HGB 11.8*  HCT 34.4*  PLT 203   ------------------------------------------------------------------------------------------------------------------  Chemistries   Recent Labs Lab 03/10/17 0321  03/12/17 0328  NA 130*  < > 132*  K 4.6  < > 4.4  CL 104  < > 103  CO2 21*  < > 24  GLUCOSE 128*  < > 164*  BUN 8  < > 9  CREATININE 1.25*  < > 0.98  CALCIUM 8.1*  < > 8.3*  MG  --   < > 1.9  AST 32  --   --   ALT 11*  --   --   ALKPHOS 106  --   --   BILITOT 0.9  --   --   < > = values in this interval not displayed. ------------------------------------------------------------------------------------------------------------------  Cardiac Enzymes  Recent Labs Lab 03/09/17 1852  TROPONINI <0.03   ------------------------------------------------------------------------------------------------------------------  RADIOLOGY:  Dg Chest Port 1 View  Result Date: 03/12/2017 CLINICAL DATA:  Follow-up pulmonary opacities EXAM: PORTABLE CHEST 1 VIEW COMPARISON:  03/09/2017 chest radiograph. FINDINGS: Partially visualized surgical hardware overlying the  lower cervical spine. Stable cardiomediastinal silhouette with top-normal heart size and aortic atherosclerosis. No pneumothorax. No pleural effusion. 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 No pulmonary edema. Stable mild scarring versus atelectasis  at the left lung base. No acute consolidative airspace disease. IMPRESSION: Stable left hemidiaphragmatic eventration with left basilar scarring versus atelectasis. Otherwise no active disease in the chest. Electronically Signed   By: Delbert Phenix M.D.   On: 03/12/2017 10:14    ASSESSMENT AND PLAN:   Active Problems:   AKI (acute kidney injury) (HCC)   Hyperkalemia   Hyponatremia   Septic shock (HCC)   Pressure injury of skin  [name]  is a 61 y.o. male with a known history of diverticulitis, hypertension, tobacco abuse is presenting to the ED with a chief complaint of dry heaves, poor by mouth intake for almost 1 week. Blood pressure was soft , and patient's shortness of breath was completely resolved after giving breathing treatments. Sodium was low at 118 and creatinine is at 1.8. Patient denies any abdominal pain or vomiting.  # Hyponatremia from dehydration, hypovolemia  Gentle hydration with IV fluids-normal saline , Sodium quickly came up to 130, so nephrologist suggest to give Dextrose IV, again switched to NS, stable now.  follow serial Serial sodiums- now dropped again, so switch back to NS. expected random urine osmolality and lites, not signs of SIADH Nephrology consult appreciated. normal TSH. Cont gentle hydration. Na 132 now.   Also giving albumin iv today.   #dry heaving causing dehydration provide supportive treatment with IV fluids, antiemetics and PPI  # hypovolemic shock    Requiring hydration and levophed.   Off levophed now, IV albumin helped.  #acute kidney injury from dehydration-prerenal IV fluids Avoid nephrotoxins Holding lisinopril home medication Monitor renal function closely, improving.  #mild COPD exacerbation Prednisone duonebs q 6hrs Albuterol q 4 hs prn   #Diabetes mellitus Sliding scale insulin Low hemoglobin A1c  #essential hypertension Currently patient is hypotensive Hold home medications for hypertension Including  lisinopril  required Levophed Drip, now off, but SBP is still < 80.  #tobacco abuse disorder Counseled patient to quit smoking for 4 minutes, patient says that he is not ready but agreeable with the nicotine patch during hospital course  # fantom pain on right LL stump   Local pain meds cream.  GI prophylaxis with Protonix DVT prophylaxis with Lovenox subcutaneous   All the records are reviewed and case discussed with Care Management/Social Workerr. Management plans discussed with the patient, family and they are in agreement.  CODE STATUS: Full.  TOTAL TIME TAKING CARE OF THIS PATIENT: 35 minutes.    POSSIBLE D/C IN 1-2 DAYS, DEPENDING ON CLINICAL CONDITION.   Altamese Dilling M.D on 03/12/2017   Between 7am to 6pm - Pager - [phone]  After 6pm go to [email or web address] - password [email or web address]  [facility]  Office  [phone]  CC: Primary care physician; Armando Gang, FNP  Note: This dictation was prepared with Dragon dictation along with smaller phrase technology. Any transcriptional errors that result from this process are unintentional.

## 2017-03-12 NOTE — Progress Notes (Signed)
PT Cancellation Note  Patient Details Name: Shawn SchlichterRobert Higgins MRN: [id number] DOB: [birth year]   Cancelled Treatment:    Reason Eval/Treat Not Completed:  (Evaluation re-attempted.  Patient continues to refuse; unable to redirect/encourage.  Reinforced need for OOB assessment to ensure ability to [facility].  Patient continues to refuse, stating he will only attempt transfers once returned to facility.  Unable to redirect or reason with patient.  Will continue efforts one additional day; however, if continues to refuse, will complete order due to lack of participation. )   [name], PT, DPT, NCS 03/12/17, 11:19 AM [phone]

## 2017-03-12 NOTE — Progress Notes (Signed)
PT Cancellation Note  Patient Details Name: Shawn SchlichterRobert Higgins MRN: [id number] DOB: 1956/03/15   Cancelled Treatment:    Reason Eval/Treat Not Completed:  (Evaluation re-attempted. Patient currently eating breakfast; will re-attempt at later time/date as available and patient agreeable.)   Of note, patient does report baseline [facility], using manual/power WC as primary source of mobility.  Unable to describe transfer technique beyond "I taught myself how to do it".  Generally disinterested in OOB attempts at this time; will continue efforts as patient allows.   Janara Klett H. Manson PasseyBrown, PT, DPT, NCS 03/12/17, 9:08 AM [phone]

## 2017-03-12 NOTE — Progress Notes (Signed)
  [facility] Critical Care Medicine Progess Note     ASSESSMENT  Hypovolemic shock - improving Hponatremia  Acute Kidney Injury - improving ETOH abuse COPD   PLAN Weaned off pressors. On bactrim. Weaning steroids.  Nephrology following. DVT prophylaxis. Full Code. If stable consider transfer out from ICU later today.  Dorothyann GibbsPrag Diamond Martucci MD    SUBJECTIVE:  Patient seen and examined at bedside. Weaned off pressors this morning. No new complaints.   VITAL SIGNS: Temp:  [98.4 F (36.9 C)-98.8 F (37.1 C)] 98.4 F (36.9 C) (09/02 0700) Pulse Rate:  [92-122] 92 (09/02 1200) Resp:  [16-25] 21 (09/02 1200) BP: (55-115)/(42-102) 109/71 (09/02 1200) SpO2:  [87 %-97 %] 97 % (09/02 1200)     PHYSICAL EXAMINATION: Physical Examination:   VS: BP 109/71   Pulse 92   Temp 98.4 F (36.9 C) (Oral)   Resp (!) 21   Ht 5\' 8"  (1.727 m)   Wt 147 lb 11.3 oz (67 kg)   SpO2 97%   BMI 22.46 kg/m    General Appearance: No distress  Neuro: non-focal HEENT: PERRLA, EOM intact. Pulmonary: normal breath sounds   Cardiovascular: Normal S1,S2.  No m/r/g.   Abdomen: Benign, Soft, non-tender. Skin:   warm, no rashes, no ecchymosis  Extremities: normal, no cyanosis, clubbing.    LABORATORY PANEL:   CBC  Recent Labs Lab 03/11/17 0138  WBC 8.5  HGB 11.8*  HCT 34.4*  PLT 203    Chemistries   Recent Labs Lab 03/10/17 0321  03/12/17 0328  NA 130*  < > 132*  K 4.6  < > 4.4  CL 104  < > 103  CO2 21*  < > 24  GLUCOSE 128*  < > 164*  BUN 8  < > 9  CREATININE 1.25*  < > 0.98  CALCIUM 8.1*  < > 8.3*  MG  --   < > 1.9  PHOS  --   < > 4.0  AST 32  --   --   ALT 11*  --   --   ALKPHOS 106  --   --   BILITOT 0.9  --   --   < > = values in this interval not displayed.   Recent Labs Lab 03/10/17 2341 03/11/17 0600 03/11/17 1158 03/11/17 2229 03/12/17 0739 03/12/17 1156  GLUCAP 166* 131* 159* 160* 81 120*   No results for input(s): PHART, PCO2ART, PO2ART in the last  168 hours.  Recent Labs Lab 03/09/17 1852 03/10/17 0321  AST 37 32  ALT 12* 11*  ALKPHOS 127* 106  BILITOT 0.8 0.9  ALBUMIN 3.1* 2.8*    Cardiac Enzymes  Recent Labs Lab 03/09/17 1852  TROPONINI <0.03    RADIOLOGY:  Dg Chest Port 1 View  Result Date: 03/12/2017 CLINICAL DATA:  Follow-up pulmonary opacities EXAM: PORTABLE CHEST 1 VIEW COMPARISON:  03/09/2017 chest radiograph. FINDINGS: Partially visualized surgical hardware overlying the lower cervical spine. Stable cardiomediastinal silhouette with top-normal heart size and aortic atherosclerosis. No pneumothorax. No pleural effusion. 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 No pulmonary edema. Stable mild scarring versus atelectasis at the left lung base. No acute consolidative airspace disease. IMPRESSION: Stable left hemidiaphragmatic eventration with left basilar scarring versus atelectasis. Otherwise no active disease in the chest. Electronically Signed   By: Delbert PhenixJason A Poff M.D.   On: 03/12/2017 10:14           03/12/2017

## 2017-03-12 NOTE — Progress Notes (Signed)
[facility]  ROUNDING NOTE   Subjective:  Patient still a bit hypotensive. However renal function has significantly improved since admission. Serum sodium up to 132. Metabolic acidosis also appears to be improved. Good urine output of 2.8 L over the preceding 24 hours.   Objective:  Vital signs in last 24 hours:  Temp:  [98.4 F (36.9 C)-98.8 F (37.1 C)] 98.4 F (36.9 C) (09/02 0700) Pulse Rate:  [92-122] 92 (09/02 1100) Resp:  [16-25] 17 (09/02 1100) BP: (55-115)/(42-102) 106/83 (09/02 1100) SpO2:  [87 %-96 %] 91 % (09/02 1100)  Weight change:  Filed Weights   03/09/17 1838 03/09/17 2337  Weight: 77.1 kg (170 lb) 67 kg (147 lb 11.3 oz)    Intake/Output: I/O last 3 completed shifts: In: 1867.3 [P.O.:240; I.V.:1627.3] Out: 4350 [Urine:3650; Stool:700]   Intake/Output this shift:  Total I/O In: 171.7 [I.V.:171.7] Out: 1 [Stool:1]  Physical Exam: General: No acute distress  Head: Normocephalic, atraumatic. Moist oral mucosal membranes  Eyes: Anicteric  Neck: Supple, trachea midline  Lungs:  Clear to auscultation, normal effort  Heart: S1S2 no rubs  Abdomen:  Soft, nontender, bowel sounds present, colostomy present  Extremities: R AKA  Neurologic: Awake, alert, following commands  Skin: No lesions       Basic Metabolic Panel:  Recent Labs Lab 03/09/17 1852  03/10/17 0321  03/10/17 1948 03/10/17 2119 03/11/17 0138 03/11/17 0607 03/12/17 0328  NA 118*  < > 130*  < > 128* 130* 131* 131* 132*  K 5.2*  --  4.6  --   --   --  4.3  --  4.4  CL 91*  --  104  --   --   --  105  --  103  CO2 17*  --  21*  --   --   --  21*  --  24  GLUCOSE 81  --  128*  --   --   --  115*  --  164*  BUN 10  --  8  --   --   --  7  --  9  CREATININE 1.81*  --  1.25*  --   --   --  0.88  --  0.98  CALCIUM 8.7*  --  8.1*  --   --   --  8.4*  --  8.3*  MG  --   --   --   --   --   --  1.5*  --  1.9  PHOS  --   --   --   --   --   --  2.3*  --  4.0  < > = values in  this interval not displayed.  Liver Function Tests:  Recent Labs Lab 03/09/17 1852 03/10/17 0321  AST 37 32  ALT 12* 11*  ALKPHOS 127* 106  BILITOT 0.8 0.9  PROT 7.1 6.6  ALBUMIN 3.1* 2.8*   No results for input(s): LIPASE, AMYLASE in the last 168 hours. No results for input(s): AMMONIA in the last 168 hours.  CBC:  Recent Labs Lab 03/09/17 1852 03/10/17 0321 03/11/17 0138  WBC 6.2 5.8 8.5  NEUTROABS 5.1  --   --   HGB 11.8* 11.3* 11.8*  HCT 34.6* 33.0* 34.4*  MCV 90.8 91.1 91.4  PLT 209 202 203    Cardiac Enzymes:  Recent Labs Lab 03/09/17 1852  TROPONINI <0.03    BNP: Invalid input(s): POCBNP  CBG:  Recent Labs Lab 03/10/17 2341  03/11/17 0600 03/11/17 1158 03/11/17 2229 03/12/17 0739  GLUCAP 166* 131* 159* 160* 81    Microbiology: Results for orders placed or performed during the hospital encounter of 03/09/17  MRSA PCR Screening     Status: None   Collection Time: 03/09/17 11:28 PM  Result Value Ref Range Status   MRSA by PCR NEGATIVE NEGATIVE Final    Comment:        The GeneXpert MRSA Assay (FDA approved for NASAL specimens only), is one component of a comprehensive MRSA colonization surveillance program. It is not intended to diagnose MRSA infection nor to guide or monitor treatment for MRSA infections.   Culture, blood (Routine X 2) w Reflex to ID Panel     Status: None (Preliminary result)   Collection Time: 03/09/17 11:57 PM  Result Value Ref Range Status   Specimen Description BLOOD LT HAND  Final   Special Requests   Final    BOTTLES DRAWN AEROBIC AND ANAEROBIC Blood Culture adequate volume   Culture NO GROWTH 2 DAYS  Final   Report Status PENDING  Incomplete  Culture, blood (Routine X 2) w Reflex to ID Panel     Status: None (Preliminary result)   Collection Time: 03/09/17 11:57 PM  Result Value Ref Range Status   Specimen Description BLOOD RT HAND  Final   Special Requests   Final    BOTTLES DRAWN AEROBIC AND ANAEROBIC  Blood Culture adequate volume   Culture NO GROWTH 2 DAYS  Final   Report Status PENDING  Incomplete    Coagulation Studies: No results for input(s): LABPROT, INR in the last 72 hours.  Urinalysis: No results for input(s): COLORURINE, LABSPEC, PHURINE, GLUCOSEU, HGBUR, BILIRUBINUR, KETONESUR, PROTEINUR, UROBILINOGEN, NITRITE, LEUKOCYTESUR in the last 72 hours.  Invalid input(s): APPERANCEUR    Imaging: Dg Chest Port 1 View  Result Date: 03/12/2017 CLINICAL DATA:  Follow-up pulmonary opacities EXAM: PORTABLE CHEST 1 VIEW COMPARISON:  03/09/2017 chest radiograph. FINDINGS: Partially visualized surgical hardware overlying the lower cervical spine. Stable cardiomediastinal silhouette with top-normal heart size and aortic atherosclerosis. No pneumothorax. No pleural effusion. 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 No pulmonary edema. Stable mild scarring versus atelectasis at the left lung base. No acute consolidative airspace disease. IMPRESSION: Stable left hemidiaphragmatic eventration with left basilar scarring versus atelectasis. Otherwise no active disease in the chest. Electronically Signed   By: Delbert PhenixJason A Poff M.D.   On: 03/12/2017 10:14     Medications:   . sodium chloride 50 mL/hr at 03/12/17 0700  . albumin human     . enoxaparin (LOVENOX) injection  40 mg Subcutaneous Q24H  . ferrous sulfate  325 mg Oral Daily  . folic acid  1 mg Oral Daily  . insulin aspart  0-20 Units Subcutaneous TID WC  . insulin aspart  0-5 Units Subcutaneous QHS  . multivitamin with minerals  1 tablet Oral Daily  . nicotine  14 mg Transdermal Daily  . pantoprazole  40 mg Oral Daily  . potassium chloride SA  20 mEq Oral Daily  . [START ON 03/13/2017] predniSONE  40 mg Oral Q breakfast  . simvastatin  10 mg Oral Daily  . sodium polystyrene  15 g Oral Once  . sulfamethoxazole-trimethoprim  1 tablet Oral BID  . thiamine  100 mg Oral Daily   acetaminophen, albuterol,  ipratropium-albuterol, MUSCLE RUB, ondansetron **OR** ondansetron (ZOFRAN) IV, polyethylene glycol  Assessment/ Plan:  61 y.o. male with a PMHx of diabetes mellitus type 2, hypertension, tobacco abuse,  who was admitted to [facility] on 03/09/2017 for evaluation of shortness of breath.  1. Acute renal failure. Suspect secondary to prolonged dehydration and NSAIDs possibly.  -  It appears renal function is normalized. Creatinine currently 0.98. Avoid nephrotoxins as possible.  2. Hyponatremia. Initial serum sodium was 118. On initial day of admission serum sodium had risen to 130 in a very short period of time. He was given D5W to bring back down his sodium temporarily. His sodium has stabilized now at 132. Continue to monitor.  3. Metabolic/lactic acidosis.  Serum bicarbonate up to 24. Continue to monitor.  4.  Hypotension:  Remains on norepinephrine at the moment. Blood pressure currently 106/83. Hopefully this can be weaned off and patient can be transitioned out of the critical care unit today.   LOS: 3 Maan Zarcone 9/2/201811:59 AM

## 2017-03-13 LAB — GLUCOSE, CAPILLARY
Glucose-Capillary: 75 mg/dL (ref 65–99)
Glucose-Capillary: 103 mg/dL — ABNORMAL HIGH (ref 65–99)

## 2017-03-13 LAB — BASIC METABOLIC PANEL
Anion gap: 4 — ABNORMAL LOW (ref 5–15)
BUN: 11 mg/dL (ref 6–20)
CO2: 24 mmol/L (ref 22–32)
Calcium: 8.4 mg/dL — ABNORMAL LOW (ref 8.9–10.3)
Chloride: 104 mmol/L (ref 101–111)
Creatinine, Ser: 0.89 mg/dL (ref 0.61–1.24)
GFR calc Af Amer: >60 mL/min (ref >60)
GLUCOSE: 106 mg/dL — AB (ref 65–99)
POTASSIUM: 4.4 mmol/L (ref 3.5–5.1)
Sodium: 132 mmol/L — ABNORMAL LOW (ref 135–145)

## 2017-03-13 LAB — MAGNESIUM: Magnesium: 1.6 mg/dL — ABNORMAL LOW (ref 1.7–2.4)

## 2017-03-13 LAB — PHOSPHORUS: PHOSPHORUS: 2.9 mg/dL (ref 2.5–4.6)

## 2017-03-13 MED ORDER — MAGNESIUM SULFATE 2 GM/50ML IV SOLN
2.0000 g | Freq: Once | INTRAVENOUS | Status: AC
  Administered 2017-03-13: 2 g via INTRAVENOUS
  Filled 2017-03-13: qty 50

## 2017-03-13 MED ORDER — PREDNISONE 10 MG PO TABS: 40.0000 mg | ORAL_TABLET | Freq: Every day | ORAL | 0 refills | Status: DC

## 2017-03-13 NOTE — Clinical Social Work Note (Signed)
MD informed CSW that he is [facility] CSW has not been able to reach the owners : Jimmye NormanLawanda Ray: [phone] or Onalee HuaDavid: [phone] to determine who will pick patient up.  York SpanielMonica Sharad Vaneaton MSW,LCSW [phone]

## 2017-03-13 NOTE — NC FL2 (Signed)
[place] MEDICAID FL2 LEVEL OF CARE SCREENING TOOL     IDENTIFICATION  Patient Name: Chloe Baig Birthdate: 1956-07-10 Sex: male Admission Date (Current Location): 03/09/2017  [place] and [place] Number:  Chiropodist and Address:  [facility], [address], [place], [place] [address]      Provider Number: [id number]  Attending Physician Name and Address:  Altamese Dilling, *  Relative Name and Phone Number:       Current Level of Care: Hospital Recommended Level of Care: Family Care Home Prior Approval Number:    Date Approved/Denied:   PASRR Number:    Discharge Plan:  (family care home)    Current Diagnoses: Patient Active Problem List   Diagnosis Date Noted  . Pressure injury of skin 03/10/2017  . Hyperkalemia   . Hyponatremia   . Septic shock (HCC)   . AKI (acute kidney injury) (HCC) 03/09/2017    Orientation RESPIRATION BLADDER Height & Weight     Self, Time, Situation, Place  Normal Continent Weight: 145 lb (65.8 kg) Height:  5\' 8"  (172.7 cm)  BEHAVIORAL SYMPTOMS/MOOD NEUROLOGICAL BOWEL NUTRITION STATUS   (none)   Continent Diet (heart healthy/carb modified)  AMBULATORY STATUS COMMUNICATION OF NEEDS Skin   Supervision Verbally Normal                       Personal Care Assistance Level of Assistance  Bathing, Dressing Bathing Assistance: Limited assistance   Dressing Assistance: Limited assistance     Functional Limitations Info   (none)          SPECIAL CARE FACTORS FREQUENCY                       Contractures Contractures Info: Not present    Additional Factors Info  Code Status Code Status Info: full                DISCHARGE MEDICATIONS:       Current Discharge Medication List        START taking these medications   Details  predniSONE (DELTASONE) 10 MG tablet Take 4 tablets (40 mg total) by mouth daily with breakfast. Qty: 10 tablet, Refills: 0           CONTINUE these medications which have NOT CHANGED   Details  acetaminophen (TYLENOL) 325 MG tablet Take 650 mg by mouth every 4 (four) hours as needed.    ferrous sulfate 325 (65 FE) MG tablet Take 1 tablet by mouth daily.    folic acid (FOLVITE) 1 MG tablet Take 1 mg by mouth daily.    indomethacin (INDOCIN) 50 MG capsule Take 50 mg by mouth 2 (two) times daily with a meal.    Multiple Vitamin (MULTIVITAMIN) tablet Take 1 tablet by mouth daily.    omeprazole (PRILOSEC) 20 MG capsule Take 1 capsule by mouth daily.    simvastatin (ZOCOR) 10 MG tablet Take 1 tablet by mouth daily.    thiamine (VITAMIN B-1) 100 MG tablet Take 100 mg by mouth daily.         STOP taking these medications     amLODipine (NORVASC) 10 MG tablet      lisinopril (PRINIVIL,ZESTRIL) 20 MG tablet      potassium chloride SA (K-DUR,KLOR-CON) 20 MEQ tablet      sulfamethoxazole-trimethoprim (BACTRIM DS,SEPTRA DS) 800-160 MG tablet          DISCHARGE INSTRUCTIONS:    Stopped BP  meds, follow with PMD in 1-2 weeks.    York SpanielMonica Alain [place], [place]

## 2017-03-13 NOTE — Discharge Summary (Signed)
[facility] -  at [facility]   PATIENT NAME: [name]    MR#:  [id number]  DATE OF BIRTH:  [birth year]  DATE OF ADMISSION:  03/09/2017 ADMITTING PHYSICIAN: Ramonita Lab, MD  DATE OF DISCHARGE: 03/13/2017   PRIMARY CARE PHYSICIAN: Armando Gang, FNP    ADMISSION DIAGNOSIS:  Hyperkalemia [E87.5] Hyponatremia [E87.1] COPD exacerbation (HCC) [J44.1] AKI (acute kidney injury) (HCC) [N17.9] Septic shock (HCC) [A41.9, R65.21]  DISCHARGE DIAGNOSIS:  Active Problems:   AKI (acute kidney injury) (HCC)   Hyperkalemia   Hyponatremia   Septic shock (HCC)   Pressure injury of skin   Hypovolemic shock  SECONDARY DIAGNOSIS:   Past Medical History:  Diagnosis Date  . Diabetes mellitus without complication (HCC)   . Hypertension     HOSPITAL COURSE:   [name] a 61 y.o. malewith a known history of diverticulitis, hypertension, tobacco abuse is presenting to the ED with a chief complaint of dry heaves, poor by mouth intake for almost 1 week. Blood pressure was soft , and patient's shortness of breath was completely resolved after giving breathing treatments. Sodium was low at 118 and creatinine is at 1.8. Patient denies any abdominal pain or vomiting.  # Hyponatremia from dehydration, hypovolemia   Suspected septic shock initially, but ruled out. Gentle hydration with IV fluids-normal saline , Sodium quickly came up to 130, so nephrologist suggest to give Dextrose IV, again switched to NS, stable now.  follow serial Serial sodiums- now dropped again, so switch back to NS. expected random urine osmolality and lites, not signs of SIADH Nephrology consult appreciated. normal TSH. Cont gentle hydration. Na 132 now.   Also giving albumin iv BP stable now.   #dry heaving causing dehydration provide supportive treatment with IV fluids, antiemetics and PPI  # hypovolemic shock    Requiring hydration and levophed.   Off levophed now,  IV albumin helped.    Advised to stop Htn meds, BP stable, at his baseline it is low- normal.  #acute kidney injury from dehydration-prerenal IV fluids Avoid nephrotoxins Holding lisinopril home medication Monitor renal function closely, improving.  #mild COPD exacerbation Prednisone duonebs q 6hrs Albuterol q 4 hs prn   #Diabetes mellitus Sliding scale insulin Low hemoglobin A1c  #essential hypertension Currently patient is hypotensive Hold home medications for hypertension Including lisinopril  required Levophed Drip, now off, SBP around 100, stop BP meds on d/c.  #tobacco abuse disorder Counseled patient to quit smoking for 4 minutes, patient says that he is not ready but agreeable with the nicotine patch during hospital course  # fantom pain on right LL stump   Local pain meds cream.  GI prophylaxis with Protonix DVT prophylaxis with Lovenox subcutaneous  DISCHARGE CONDITIONS:   Stable.  CONSULTS OBTAINED:  Treatment Team:  Mady Haagensen, MD Altamese Dilling, MD  DRUG ALLERGIES:   Allergies  Allergen Reactions  . Cephalosporins   . Penicillins     DISCHARGE MEDICATIONS:   Current Discharge Medication List    START taking these medications   Details  predniSONE (DELTASONE) 10 MG tablet Take 4 tablets (40 mg total) by mouth daily with breakfast. Qty: 10 tablet, Refills: 0      CONTINUE these medications which have NOT CHANGED   Details  acetaminophen (TYLENOL) 325 MG tablet Take 650 mg by mouth every 4 (four) hours as needed.    ferrous sulfate 325 (65 FE) MG tablet Take 1 tablet by mouth daily.    folic acid (FOLVITE)  1 MG tablet Take 1 mg by mouth daily.    indomethacin (INDOCIN) 50 MG capsule Take 50 mg by mouth 2 (two) times daily with a meal.    Multiple Vitamin (MULTIVITAMIN) tablet Take 1 tablet by mouth daily.    omeprazole (PRILOSEC) 20 MG capsule Take 1 capsule by mouth daily.    simvastatin (ZOCOR) 10 MG tablet Take  1 tablet by mouth daily.    thiamine (VITAMIN B-1) 100 MG tablet Take 100 mg by mouth daily.      STOP taking these medications     amLODipine (NORVASC) 10 MG tablet      lisinopril (PRINIVIL,ZESTRIL) 20 MG tablet      potassium chloride SA (K-DUR,KLOR-CON) 20 MEQ tablet      sulfamethoxazole-trimethoprim (BACTRIM DS,SEPTRA DS) 800-160 MG tablet          DISCHARGE INSTRUCTIONS:    Stopped BP meds, follow with PMD in 1-2 weeks.  If you experience worsening of your admission symptoms, develop shortness of breath, life threatening emergency, suicidal or homicidal thoughts you must seek medical attention immediately by calling 911 or calling your MD immediately  if symptoms less severe.  You Must read complete instructions/literature along with all the possible adverse reactions/side effects for all the Medicines you take and that have been prescribed to you. Take any new Medicines after you have completely understood and accept all the possible adverse reactions/side effects.   Please note  You were cared for by a hospitalist during your hospital stay. If you have any questions about your discharge medications or the care you received while you were in the hospital after you are discharged, you can call the unit and asked to speak with the hospitalist on call if the hospitalist that took care of you is not available. Once you are discharged, your primary care physician will handle any further medical issues. Please note that NO REFILLS for any discharge medications will be authorized once you are discharged, as it is imperative that you return to your primary care physician (or establish a relationship with a primary care physician if you do not have one) for your aftercare needs so that they can reassess your need for medications and monitor your lab values.    Today   CHIEF COMPLAINT:   Chief Complaint  Patient presents with  . Shortness of Breath    HISTORY OF PRESENT  ILLNESS:  [name]  is a 61 y.o. male with a known history of diverticulitis, hypertension, tobacco abuse is presenting to the ED with a chief complaint of dry heaves, poor by mouth intake for almost 1 week. Blood pressure was soft , and patient's shortness of breath was completely resolved after giving breathing treatments. Sodium was low at 118 and creatinine is at 1.8. Patient denies any abdominal pain or vomiting. Denies any diarrhea. No sick contacts. 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 rate (!) 24, height 5\' 8"  (1.727 m), weight 65.8 kg (145 lb), SpO2 97 %.  I/O:   Intake/Output Summary (Last 24 hours) at 03/13/17 0954 Last data filed at 03/13/17 0732  Gross per 24 hour  Intake          2174.17 ml  Output             2825 ml  Net          -  650.83 ml    PHYSICAL EXAMINATION:  GENERAL: 61 y.o.-year-old patient lying in the bed with no acute distress.  EYES: Pupils equal, round, reactive to light and accommodation. No scleral icterus. Extraocular muscles intact.  HEENT: Head atraumatic, normocephalic. Oropharynx and nasopharynx clear.  NECK: Supple, no jugular venous distention. No thyroid enlargement, no tenderness.  LUNGS: Normal breath sounds bilaterally, no wheezing, rales,rhonchi or crepitation. No use of accessory muscles of respiration.  CARDIOVASCULAR: S1, S2 normal. No murmurs, rubs, or gallops.  ABDOMEN: Soft, nontender, nondistended. Bowel sounds present.colostomy site is intact EXTREMITIES: No pedal edema, cyanosis, or clubbing. 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 NEUROLOGIC: Cranial nerves II through XII are intact. Muscle strength 4/5 in all extremities. Sensation intact. Gait not checked.  PSYCHIATRIC: The patient is alert and oriented x 3.  SKIN: No obvious rash,  lesion, or ulcer.   DATA REVIEW:   CBC  Recent Labs Lab 03/11/17 0138  WBC 8.5  HGB 11.8*  HCT 34.4*  PLT 203    Chemistries   Recent Labs Lab 03/10/17 0321  03/13/17 0329  NA 130*  < > 132*  K 4.6  < > 4.4  CL 104  < > 104  CO2 21*  < > 24  GLUCOSE 128*  < > 106*  BUN 8  < > 11  CREATININE 1.25*  < > 0.89  CALCIUM 8.1*  < > 8.4*  MG  --   < > 1.6*  AST 32  --   --   ALT 11*  --   --   ALKPHOS 106  --   --   BILITOT 0.9  --   --   < > = values in this interval not displayed.  Cardiac Enzymes  Recent Labs Lab 03/09/17 1852  TROPONINI <0.03    Microbiology Results  Results for orders placed or performed during the hospital encounter of 03/09/17  MRSA PCR Screening     Status: None   Collection Time: 03/09/17 11:28 PM  Result Value Ref Range Status   MRSA by PCR NEGATIVE NEGATIVE Final    Comment:        The GeneXpert MRSA Assay (FDA approved for NASAL specimens only), is one component of a comprehensive MRSA colonization surveillance program. It is not intended to diagnose MRSA infection nor to guide or monitor treatment for MRSA infections.   Culture, blood (Routine X 2) w Reflex to ID Panel     Status: None (Preliminary result)   Collection Time: 03/09/17 11:57 PM  Result Value Ref Range Status   Specimen Description BLOOD LT HAND  Final   Special Requests   Final    BOTTLES DRAWN AEROBIC AND ANAEROBIC Blood Culture adequate volume   Culture NO GROWTH 3 DAYS  Final   Report Status PENDING  Incomplete  Culture, blood (Routine X 2) w Reflex to ID Panel     Status: None (Preliminary result)   Collection Time: 03/09/17 11:57 PM  Result Value Ref Range Status   Specimen Description BLOOD RT HAND  Final   Special Requests   Final    BOTTLES DRAWN AEROBIC AND ANAEROBIC Blood Culture adequate volume   Culture NO GROWTH 3 DAYS  Final   Report Status PENDING  Incomplete    RADIOLOGY:  Dg Chest Port 1 View  Result Date: 03/12/2017 CLINICAL DATA:   Follow-up pulmonary opacities EXAM: PORTABLE CHEST 1 VIEW COMPARISON:  03/09/2017 chest radiograph. FINDINGS: Partially visualized surgical hardware overlying  the lower cervical spine. Stable cardiomediastinal silhouette with top-normal heart size and aortic atherosclerosis. No pneumothorax. No pleural effusion. 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 No pulmonary edema. Stable mild scarring versus atelectasis at the left lung base. No acute consolidative airspace disease. IMPRESSION: Stable left hemidiaphragmatic eventration with left basilar scarring versus atelectasis. Otherwise no active disease in the chest. Electronically Signed   By: Delbert PhenixJason A Poff M.D.   On: 03/12/2017 10:14    EKG:   Orders placed or performed during the hospital encounter of 03/09/17  . ED EKG  . ED EKG  . EKG 12-Lead  . EKG 12-Lead      Management plans discussed with the patient, family and they are in agreement.  CODE STATUS:     Code Status Orders        Start     Ordered   03/09/17 2335  Full code  Continuous     03/09/17 2334    Code Status History    Date Active Date Inactive Code Status Order ID Comments User Context   This patient has a current code status but no historical code status.      TOTAL TIME TAKING CARE OF THIS PATIENT: 35 minutes.    Altamese DillingVACHHANI, Eliyanna Ault M.D on 03/13/2017 at 9:54 AM  Between 7am to 6pm - Pager - [phone]  After 6pm go to [email or web address] - password [email or web address]  [facility]  Office  [phone]  CC: Primary care physician; Armando GangLindley, Cheryl P, FNP   Note: This dictation was prepared with Dragon dictation along with smaller phrase technology. Any transcriptional errors that result from this process are unintentional.

## 2017-03-13 NOTE — Progress Notes (Addendum)
Notified Dr. Esaw GrandchildVachanni of BP of 80/60 on manual recheck, pt asymptomatic. Pt stated that his BP sometimes fluctuates. Per MD okay for patient to still discharge.Will continue to monitor.

## 2017-03-13 NOTE — Progress Notes (Signed)
Collene Schlichterobert [place] to be D/C'd family care home per MD order.  Discussed prescriptions and follow up appointments with the patient. Prescriptions given to patient, medication list explained in detail. Pt verbalized understanding.  Allergies as of 03/13/2017      Reactions   Cephalosporins    Penicillins       Medication List    STOP taking these medications   amLODipine 10 MG tablet Commonly known as:  NORVASC   lisinopril 20 MG tablet Commonly known as:  PRINIVIL,ZESTRIL   potassium chloride SA 20 MEQ tablet Commonly known as:  K-DUR,KLOR-CON   sulfamethoxazole-trimethoprim 800-160 MG tablet Commonly known as:  BACTRIM DS,SEPTRA DS     TAKE these medications   acetaminophen 325 MG tablet Commonly known as:  TYLENOL Take 650 mg by mouth every 4 (four) hours as needed.   ferrous sulfate 325 (65 FE) MG tablet Take 1 tablet by mouth daily.   folic acid 1 MG tablet Commonly known as:  FOLVITE Take 1 mg by mouth daily.   indomethacin 50 MG capsule Commonly known as:  INDOCIN Take 50 mg by mouth 2 (two) times daily with a meal.   multivitamin tablet Take 1 tablet by mouth daily.   omeprazole 20 MG capsule Commonly known as:  PRILOSEC Take 1 capsule by mouth daily.   predniSONE 10 MG tablet Commonly known as:  DELTASONE Take 4 tablets (40 mg total) by mouth daily with breakfast.   simvastatin 10 MG tablet Commonly known as:  ZOCOR Take 1 tablet by mouth daily.   thiamine 100 MG tablet Commonly known as:  VITAMIN B-1 Take 100 mg by mouth daily.            Discharge Care Instructions        Start     Ordered   03/14/17 0000  predniSONE (DELTASONE) 10 MG tablet  Daily with breakfast     03/13/17 0953   03/13/17 0000  Increase activity slowly     03/13/17 0953   03/13/17 0000  Diet - low sodium heart healthy     03/13/17 0953      Vitals:   03/13/17 1018 03/13/17 1444  BP:  (!) 89/65  Pulse: 93 99  Resp:  18  Temp:  98.7 F (37.1 C)  SpO2: 91% 92%      IV catheter discontinued intact. Site without signs and symptoms of complications. Dressing and pressure applied. Pt denies pain at this time. No complaints noted.  An After Visit Summary was printed and given to the patient. Patient escorted via EMS, and D/C to a family care home.   Suzzanne CloudJuan G Rodriguez Ornelas

## 2017-03-13 NOTE — Clinical Social Work Note (Signed)
CSW attempted numerous times to reach [name] and/or [name] from [facility] and finally was able to reach [place]. [name] stated patient will have to return EMS due to the fact that they cannot come get him. CSW made patient aware. York SpanielMonica Morgan Keinath MSW,LCSW [phone]

## 2017-03-13 NOTE — Consult Note (Signed)
MEDICATION RELATED CONSULT NOTE - INITIAL   Pharmacy Consult for electrolytes Indication: hypomag, hypophos  Allergies  Allergen Reactions  . Cephalosporins   . Penicillins     Patient Measurements: Height: 5\' 8"  (172.7 cm) Weight: 145 lb (65.8 kg) IBW/kg (Calculated) : 68.4 Adjusted Body Weight:   Vital Signs: Temp: 98.3 F (36.8 C) (09/03 0417) Temp Source: Oral (09/03 0417) BP: 107/71 (09/03 0417) Pulse Rate: 78 (09/03 0417) Intake/Output from previous day: 09/02 0701 - 09/03 0700 In: 2274.2 [P.O.:555; I.V.:1319.5; IV Piggyback:399.7] Out: 2826 [Urine:2825; Stool:1] Intake/Output from this shift: Total I/O In: 754 [I.V.:717; IV Piggyback:37] Out: 1025 [Urine:1025]  Labs:  Recent Labs  03/11/17 0138 03/12/17 0328 03/13/17 0329  WBC 8.5  --   --   HGB 11.8*  --   --   HCT 34.4*  --   --   PLT 203  --   --   CREATININE 0.88 0.98 0.89  MG 1.5* 1.9 1.6*  PHOS 2.3* 4.0 2.9   Estimated Creatinine Clearance: 79.1 mL/min (by C-G formula based on SCr of 0.89 mg/dL).   Microbiology: Recent Results (from the past 720 hour(s))  MRSA PCR Screening     Status: None   Collection Time: 03/09/17 11:28 PM  Result Value Ref Range Status   MRSA by PCR NEGATIVE NEGATIVE Final    Comment:        The GeneXpert MRSA Assay (FDA approved for NASAL specimens only), is one component of a comprehensive MRSA colonization surveillance program. It is not intended to diagnose MRSA infection nor to guide or monitor treatment for MRSA infections.   Culture, blood (Routine X 2) w Reflex to ID Panel     Status: None (Preliminary result)   Collection Time: 03/09/17 11:57 PM  Result Value Ref Range Status   Specimen Description BLOOD LT HAND  Final   Special Requests   Final    BOTTLES DRAWN AEROBIC AND ANAEROBIC Blood Culture adequate volume   Culture NO GROWTH 2 DAYS  Final   Report Status PENDING  Incomplete  Culture, blood (Routine X 2) w Reflex to ID Panel     Status: None  (Preliminary result)   Collection Time: 03/09/17 11:57 PM  Result Value Ref Range Status   Specimen Description BLOOD RT HAND  Final   Special Requests   Final    BOTTLES DRAWN AEROBIC AND ANAEROBIC Blood Culture adequate volume   Culture NO GROWTH 2 DAYS  Final   Report Status PENDING  Incomplete    Medical History: Past Medical History:  Diagnosis Date  . Diabetes mellitus without complication (HCC)   . Hypertension     Medications:  Scheduled:  . enoxaparin (LOVENOX) injection  40 mg Subcutaneous Q24H  . ferrous sulfate  325 mg Oral Daily  . folic acid  1 mg Oral Daily  . insulin aspart  0-20 Units Subcutaneous TID WC  . insulin aspart  0-5 Units Subcutaneous QHS  . multivitamin with minerals  1 tablet Oral Daily  . nicotine  14 mg Transdermal Daily  . pantoprazole  40 mg Oral Daily  . potassium chloride SA  20 mEq Oral Daily  . predniSONE  40 mg Oral Q breakfast  . simvastatin  10 mg Oral Daily  . sulfamethoxazole-trimethoprim  1 tablet Oral BID  . thiamine  100 mg Oral Daily    Assessment: Patient is a 61 year old male who is admitted with the chief complaint of dry heaves, poor by mouth intake  for almost 1 week. Pt also found to have AKI which is resolving. Pt was found to have a Na of 118 which was corrected quickly to 130. Also found to have a phos of 2.3 and mag of 1.5. K was inital elevated at 5.2, now 4.3.  Goal of Therapy:  Normalization of electrolytes  Plan:  Mag IV 2 g once, Kphos neutral 500mg  TID x 1 day. Will recheck in the AM.  Na management replacement per nephrology.  9/2 AM Mg, PO4, and K WNL. Recheck with tomorrow AM labs.  9/3 AM Mg 1.6. Magnesium sulfate 2 grams IV ordered x1. Recheck BMP and Mg with tomorrow AM labs,.  Fulton Reek, PharmD, BCPS  03/13/17 5:12 AM

## 2017-03-13 NOTE — Progress Notes (Signed)
[facility]  ROUNDING NOTE   Subjective:  Patient transitioned back to floor care. Blood pressure currently 80/60. Good urine output of 2.8 L. Serum sodium stable at 132. Renal function also remains normal.   Objective:  Vital signs in last 24 hours:  Temp:  [97.8 F (36.6 C)-98.9 F (37.2 C)] 98.3 F (36.8 C) (09/03 0417) Pulse Rate:  [78-107] 93 (09/03 1018) Resp:  [17-24] 24 (09/03 0417) BP: (71-109)/(48-83) 80/60 (09/03 1010) SpO2:  [91 %-100 %] 91 % (09/03 1018) Weight:  [65.8 kg (145 lb)] 65.8 kg (145 lb) (09/02 1653)  Weight change:  Filed Weights   03/09/17 1838 03/09/17 2337 03/12/17 1653  Weight: 77.1 kg (170 lb) 67 kg (147 lb 11.3 oz) 65.8 kg (145 lb)    Intake/Output: I/O last 3 completed shifts: In: 3204.2 [P.O.:555; I.V.:2249.5; IV Piggyback:399.7] Out: 4576 [Urine:4075; Stool:501]   Intake/Output this shift:  Total I/O In: 260 [P.O.:120; I.V.:90; IV Piggyback:50] Out: 50 [Stool:50]  Physical Exam: General: No acute distress  Head: Normocephalic, atraumatic. Moist oral mucosal membranes  Eyes: Anicteric  Neck: Supple, trachea midline  Lungs:  Clear to auscultation, normal effort  Heart: S1S2 no rubs  Abdomen:  Soft, nontender, bowel sounds present, colostomy present  Extremities: R AKA  Neurologic: Awake, alert, following commands  Skin: No lesions       Basic Metabolic Panel:  Recent Labs Lab 03/09/17 1852  03/10/17 0321  03/10/17 2119 03/11/17 0138 03/11/17 0607 03/12/17 0328 03/13/17 0329  NA 118*  < > 130*  < > 130* 131* 131* 132* 132*  K 5.2*  --  4.6  --   --  4.3  --  4.4 4.4  CL 91*  --  104  --   --  105  --  103 104  CO2 17*  --  21*  --   --  21*  --  24 24  GLUCOSE 81  --  128*  --   --  115*  --  164* 106*  BUN 10  --  8  --   --  7  --  9 11  CREATININE 1.81*  --  1.25*  --   --  0.88  --  0.98 0.89  CALCIUM 8.7*  --  8.1*  --   --  8.4*  --  8.3* 8.4*  MG  --   --   --   --   --  1.5*  --  1.9 1.6*   PHOS  --   --   --   --   --  2.3*  --  4.0 2.9  < > = values in this interval not displayed.  Liver Function Tests:  Recent Labs Lab 03/09/17 1852 03/10/17 0321  AST 37 32  ALT 12* 11*  ALKPHOS 127* 106  BILITOT 0.8 0.9  PROT 7.1 6.6  ALBUMIN 3.1* 2.8*   No results for input(s): LIPASE, AMYLASE in the last 168 hours. No results for input(s): AMMONIA in the last 168 hours.  CBC:  Recent Labs Lab 03/09/17 1852 03/10/17 0321 03/11/17 0138  WBC 6.2 5.8 8.5  NEUTROABS 5.1  --   --   HGB 11.8* 11.3* 11.8*  HCT 34.6* 33.0* 34.4*  MCV 90.8 91.1 91.4  PLT 209 202 203    Cardiac Enzymes:  Recent Labs Lab 03/09/17 1852  TROPONINI <0.03    BNP: Invalid input(s): POCBNP  CBG:  Recent Labs Lab 03/12/17 1156 03/12/17 1606 03/12/17 1704 03/12/17  2101 03/13/17 0735  GLUCAP 120* 237* 139* 139* 75    Microbiology: Results for orders placed or performed during the hospital encounter of 03/09/17  MRSA PCR Screening     Status: None   Collection Time: 03/09/17 11:28 PM  Result Value Ref Range Status   MRSA by PCR NEGATIVE NEGATIVE Final    Comment:        The GeneXpert MRSA Assay (FDA approved for NASAL specimens only), is one component of a comprehensive MRSA colonization surveillance program. It is not intended to diagnose MRSA infection nor to guide or monitor treatment for MRSA infections.   Culture, blood (Routine X 2) w Reflex to ID Panel     Status: None (Preliminary result)   Collection Time: 03/09/17 11:57 PM  Result Value Ref Range Status   Specimen Description BLOOD LT HAND  Final   Special Requests   Final    BOTTLES DRAWN AEROBIC AND ANAEROBIC Blood Culture adequate volume   Culture NO GROWTH 3 DAYS  Final   Report Status PENDING  Incomplete  Culture, blood (Routine X 2) w Reflex to ID Panel     Status: None (Preliminary result)   Collection Time: 03/09/17 11:57 PM  Result Value Ref Range Status   Specimen Description BLOOD RT HAND  Final    Special Requests   Final    BOTTLES DRAWN AEROBIC AND ANAEROBIC Blood Culture adequate volume   Culture NO GROWTH 3 DAYS  Final   Report Status PENDING  Incomplete    Coagulation Studies: No results for input(s): LABPROT, INR in the last 72 hours.  Urinalysis: No results for input(s): COLORURINE, LABSPEC, PHURINE, GLUCOSEU, HGBUR, BILIRUBINUR, KETONESUR, PROTEINUR, UROBILINOGEN, NITRITE, LEUKOCYTESUR in the last 72 hours.  Invalid input(s): APPERANCEUR    Imaging: Dg Chest Port 1 View  Result Date: 03/12/2017 CLINICAL DATA:  Follow-up pulmonary opacities EXAM: PORTABLE CHEST 1 VIEW COMPARISON:  03/09/2017 chest radiograph. FINDINGS: Partially visualized surgical hardware overlying the lower cervical spine. Stable cardiomediastinal silhouette with top-normal heart size and aortic atherosclerosis. No pneumothorax. No pleural effusion. 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 No pulmonary edema. Stable mild scarring versus atelectasis at the left lung base. No acute consolidative airspace disease. IMPRESSION: Stable left hemidiaphragmatic eventration with left basilar scarring versus atelectasis. Otherwise no active disease in the chest. Electronically Signed   By: Delbert PhenixJason A Poff M.D.   On: 03/12/2017 10:14     Medications:   . magnesium sulfate 1 - 4 g bolus IVPB 2 g (03/13/17 1011)   . enoxaparin (LOVENOX) injection  40 mg Subcutaneous Q24H  . ferrous sulfate  325 mg Oral Daily  . folic acid  1 mg Oral Daily  . insulin aspart  0-20 Units Subcutaneous TID WC  . insulin aspart  0-5 Units Subcutaneous QHS  . multivitamin with minerals  1 tablet Oral Daily  . nicotine  14 mg Transdermal Daily  . pantoprazole  40 mg Oral Daily  . potassium chloride SA  20 mEq Oral Daily  . predniSONE  40 mg Oral Q breakfast  . simvastatin  10 mg Oral Daily  . sulfamethoxazole-trimethoprim  1 tablet Oral BID  . thiamine  100 mg Oral Daily   acetaminophen, albuterol,  ipratropium-albuterol, MUSCLE RUB, ondansetron **OR** ondansetron (ZOFRAN) IV, polyethylene glycol  Assessment/ Plan:  61 y.o. male with a PMHx of diabetes mellitus type 2, hypertension, tobacco abuse, who [facility]2017 for evaluation of shortness of breath.  1. Acute renal failure.  Suspect secondary to prolonged dehydration and NSAIDs possibly.  -  Renal function has improved slightly. Creatinine down to 0.8. Continue to periodically monitor.  2. Hyponatremia. Initial serum sodium was 118. On initial day of admission serum sodium had risen to 130 in a very short period of time. He was given D5W to bring back down his sodium temporarily. -  Serum sodium currently stable at 132. Continue to monitor.  3. Metabolic/lactic acidosis.  Now resolved. Serum bicarbonate stable at 24.  4.  Hypotension:  Patient off norepinephrine now. Blood pressure 80/60. Patient currently has chronically low blood pressure. Continue to monitor. Consider midodrine if blood pressure remains low however defer this to hospitalist.  5. No further renal input. We'll sign off.  LOS: 4 Ruairi Stutsman 9/3/201810:55 AM

## 2017-03-15 LAB — CULTURE, BLOOD (ROUTINE X 2)
CULTURE: NO GROWTH
Culture: NO GROWTH
SPECIAL REQUESTS: ADEQUATE
Special Requests: ADEQUATE

## 2017-05-31 ENCOUNTER — Emergency Department
Admission: EM | Admit: 2017-05-31 | Discharge: 2017-05-31 | Disposition: A | Discharge status: Home / Self Care | Payer: Medicaid Other | Attending: Emergency Medicine | Admitting: Emergency Medicine

## 2017-05-31 ENCOUNTER — Other Ambulatory Visit: Payer: Self-pay

## 2017-05-31 ENCOUNTER — Encounter: Payer: Self-pay | Admitting: Emergency Medicine

## 2017-05-31 ENCOUNTER — Emergency Department: Payer: Medicaid Other

## 2017-05-31 DIAGNOSIS — E119 Type 2 diabetes mellitus without complications: Secondary | ICD-10-CM | POA: Insufficient documentation

## 2017-05-31 DIAGNOSIS — Y929 Unspecified place or not applicable: Secondary | ICD-10-CM | POA: Diagnosis not present

## 2017-05-31 DIAGNOSIS — M87052 Idiopathic aseptic necrosis of left femur: Secondary | ICD-10-CM | POA: Insufficient documentation

## 2017-05-31 DIAGNOSIS — I10 Essential (primary) hypertension: Secondary | ICD-10-CM | POA: Insufficient documentation

## 2017-05-31 DIAGNOSIS — Y939 Activity, unspecified: Secondary | ICD-10-CM | POA: Diagnosis not present

## 2017-05-31 DIAGNOSIS — Y999 Unspecified external cause status: Secondary | ICD-10-CM | POA: Diagnosis not present

## 2017-05-31 DIAGNOSIS — Z79899 Other long term (current) drug therapy: Secondary | ICD-10-CM | POA: Insufficient documentation

## 2017-05-31 DIAGNOSIS — F17210 Nicotine dependence, cigarettes, uncomplicated: Secondary | ICD-10-CM | POA: Insufficient documentation

## 2017-05-31 DIAGNOSIS — S76912A Strain of unspecified muscles, fascia and tendons at thigh level, left thigh, initial encounter: Secondary | ICD-10-CM | POA: Diagnosis not present

## 2017-05-31 DIAGNOSIS — M79605 Pain in left leg: Secondary | ICD-10-CM

## 2017-05-31 DIAGNOSIS — X500XXA Overexertion from strenuous movement or load, initial encounter: Secondary | ICD-10-CM | POA: Insufficient documentation

## 2017-05-31 DIAGNOSIS — R52 Pain, unspecified: Secondary | ICD-10-CM

## 2017-05-31 LAB — CBC WITH DIFFERENTIAL/PLATELET
BASOS ABS: 0 10*3/uL (ref 0–0.1)
Basophils Relative: 0 %
Eosinophils Absolute: 0 10*3/uL (ref 0–0.7)
Eosinophils Relative: 0 %
HEMATOCRIT: 35.4 % — AB (ref 40.0–52.0)
Hemoglobin: 11.9 g/dL — ABNORMAL LOW (ref 13.0–18.0)
LYMPHS PCT: 5 %
Lymphs Abs: 0.5 10*3/uL — ABNORMAL LOW (ref 1.0–3.6)
MCH: 29.7 pg (ref 26.0–34.0)
MCHC: 33.7 g/dL (ref 32.0–36.0)
MCV: 88.2 fL (ref 80.0–100.0)
Monocytes Absolute: 0.7 10*3/uL (ref 0.2–1.0)
Monocytes Relative: 7 %
NEUTROS ABS: 9 10*3/uL — AB (ref 1.4–6.5)
NEUTROS PCT: 88 %
Platelets: 189 10*3/uL (ref 150–440)
RBC: 4.02 MIL/uL — AB (ref 4.40–5.90)
RDW: 14.4 % (ref 11.5–14.5)
WBC: 10.1 10*3/uL (ref 3.8–10.6)

## 2017-05-31 LAB — BASIC METABOLIC PANEL
ANION GAP: 7 (ref 5–15)
BUN: 18 mg/dL (ref 6–20)
CO2: 24 mmol/L (ref 22–32)
Calcium: 8.6 mg/dL — ABNORMAL LOW (ref 8.9–10.3)
Chloride: 99 mmol/L — ABNORMAL LOW (ref 101–111)
Creatinine, Ser: 1.03 mg/dL (ref 0.61–1.24)
GLUCOSE: 149 mg/dL — AB (ref 65–99)
POTASSIUM: 3.9 mmol/L (ref 3.5–5.1)
Sodium: 130 mmol/L — ABNORMAL LOW (ref 135–145)

## 2017-05-31 LAB — CK: CK TOTAL: 55 U/L (ref 49–397)

## 2017-05-31 MED ORDER — CYCLOBENZAPRINE HCL 10 MG PO TABS
5.0000 mg | ORAL_TABLET | Freq: Once | ORAL | Status: AC
  Administered 2017-05-31: 5 mg via ORAL
  Filled 2017-05-31: qty 1

## 2017-05-31 MED ORDER — CYCLOBENZAPRINE HCL 5 MG PO TABS: 5.0000 mg | ORAL_TABLET | Freq: Three times a day (TID) | ORAL | 0 refills | Status: DC | PRN

## 2017-05-31 MED ORDER — OXYCODONE-ACETAMINOPHEN 5-325 MG PO TABS: 1.0000 | ORAL_TABLET | Freq: Four times a day (QID) | ORAL | 0 refills | Status: DC | PRN

## 2017-05-31 MED ORDER — ACETAMINOPHEN 500 MG PO TABS
1000.0000 mg | ORAL_TABLET | Freq: Once | ORAL | Status: AC
  Administered 2017-05-31: 1000 mg via ORAL
  Filled 2017-05-31: qty 2

## 2017-05-31 MED ORDER — OXYCODONE HCL 5 MG PO TABS
5.0000 mg | ORAL_TABLET | Freq: Once | ORAL | Status: AC
  Administered 2017-05-31: 5 mg via ORAL
  Filled 2017-05-31: qty 1

## 2017-05-31 NOTE — ED Triage Notes (Addendum)
Pt presents with left lower extremity pain from hip to knee. Pt states that pain started Sunday; denies injury or trauma. Pt states he can normally go from wheelchair to bed but can't due to the pain. Pt alert & oriented with NAD noted.

## 2017-05-31 NOTE — ED Notes (Signed)
Spoke with Shawn BlaseLawanda from [facility]; she requests that we send him home in a taxi and stated they will pay for it when it arrives.

## 2017-05-31 NOTE — ED Provider Notes (Signed)
[facility] Emergency Department Provider Note  ____________________________________________  Time seen: Approximately 11:02 AM  I have reviewed the triage vital signs and the nursing notes.   HISTORY  Chief Complaint Leg Pain   HPI [name] is a 61 y.o. male with h/o DM, HTN, RLE AKA who presents for evaluation of LLE pain. Patient reports that 4 days ago he was changing the battery of his wheelchair which was very heavy. He woke up the next day with pain in the L thigh region which has been progressively worse and today he was unable to transfer himself from bed to wheelchair. The pain is minimal at rest but severe with any movement of the leg, located in his R thigh from hip to knee, non radiating. No trauma or fall. No rash. No fever or chills, no abdominal pain. No prior h/o blood clots/   Past Medical History:  Diagnosis Date  . Diabetes mellitus without complication (HCC)   . Hypertension     Patient Active Problem List   Diagnosis Date Noted  . Pressure injury of skin 03/10/2017  . Hyperkalemia   . Hyponatremia   . Septic shock (HCC)   . AKI (acute kidney injury) (HCC) 03/09/2017    Past Surgical History:  Procedure Laterality Date  . ABOVE KNEE LEG AMPUTATION Right   . COLOSTOMY    . shoulder surgery      Prior to Admission medications   Medication Sig Start Date End Date Taking? Authorizing Provider  acetaminophen (TYLENOL) 325 MG tablet Take 650 mg by mouth every 4 (four) hours as needed.   Yes [provider]  ferrous sulfate 325 (65 FE) MG tablet Take 1 tablet by mouth daily.   Yes [provider]  folic acid (FOLVITE) 1 MG tablet Take 1 mg by mouth daily.   Yes [provider]  indomethacin (INDOCIN) 50 MG capsule Take 50 mg by mouth 2 (two) times daily with a meal.   Yes [provider]  Multiple Vitamin (MULTIVITAMIN) tablet Take 1 tablet by mouth daily.   Yes [provider]    omeprazole (PRILOSEC) 20 MG capsule Take 1 capsule by mouth daily.   Yes [provider]  potassium chloride SA (K-DUR,KLOR-CON) 20 MEQ tablet Take 20 mEq by mouth daily.   Yes [provider]  simvastatin (ZOCOR) 10 MG tablet Take 1 tablet by mouth daily.   Yes [provider]  thiamine (VITAMIN B-1) 100 MG tablet Take 100 mg by mouth daily.   Yes [provider]  traMADol (ULTRAM) 50 MG tablet Take 50 mg by mouth 2 (two) times daily as needed.   Yes [provider]  cyclobenzaprine (FLEXERIL) 5 MG tablet Take 1 tablet (5 mg total) by mouth 3 (three) times daily as needed for muscle spasms. 05/31/17   Nita Sickle, MD  oxyCODONE-acetaminophen (ROXICET) 5-325 MG tablet Take 1 tablet by mouth every 6 (six) hours as needed. 05/31/17 05/31/18  Nita Sickle, MD  predniSONE (DELTASONE) 10 MG tablet Take 4 tablets (40 mg total) by mouth daily with breakfast. Patient not taking: Reported on 05/31/2017 03/14/17   Altamese Dilling, MD    Allergies Cephalosporins and Penicillins  History reviewed. No pertinent family history.  Social History Social History   Tobacco Use  . Smoking status: Current Every Day Smoker    Packs/day: 0.50    Types: Cigarettes  . Smokeless tobacco: Former Engineer, water Use Topics  . Alcohol use: Yes  Comment: "when I can"  . Drug use: No    Review of Systems  Constitutional: Negative for fever. Eyes: Negative for visual changes. ENT: Negative for sore throat. Neck: No neck pain  Cardiovascular: Negative for chest pain. Respiratory: Negative for shortness of breath. Gastrointestinal: Negative for abdominal pain, vomiting or diarrhea. Genitourinary: Negative for dysuria. Musculoskeletal: Negative for back pain. + L thing pain Skin: Negative for rash. Neurological: Negative for headaches, weakness or numbness. Psych: No SI or HI  ____________________________________________   PHYSICAL  EXAM:  VITAL SIGNS: ED Triage Vitals  Enc Vitals Group     BP --      Pulse Rate 05/31/17 0955 (!) 103     Resp 05/31/17 0955 (!) 22     Temp 05/31/17 0955 98.4 F (36.9 C)     Temp Source 05/31/17 0955 Oral     SpO2 05/31/17 0955 99 %     Weight 05/31/17 0956 155 lb (70.3 kg)     Height 05/31/17 0956 5\' 10"  (1.778 m)     Head Circumference --      Peak Flow --      Pain Score 05/31/17 0955 9     Pain Loc --      Pain Edu? --      Excl. in GC? --     Constitutional: Alert and oriented. Well appearing and in no apparent distress. HEENT:      Head: Normocephalic and atraumatic.         Eyes: Conjunctivae are normal. Sclera is non-icteric.       Mouth/Throat: Mucous membranes are moist.       Neck: Supple with no signs of meningismus. Cardiovascular: Regular rate and rhythm. No murmurs, gallops, or rubs. 2+ symmetrical distal pulses are present in all extremities. No JVD. Respiratory: Normal respiratory effort. Lungs are clear to auscultation bilaterally. No wheezes, crackles, or rhonchi.  Gastrointestinal: Soft, non tender, and non distended with positive bowel sounds. No rebound or guarding. Bilateral testicles are descended with no tenderness to palpation, bilateral positive cremasteric reflexes are present, no swelling or erythema of the scrotum. No evidence of inguinal hernia. Musculoskeletal: R AKA, LLE is warm and well perfused with strong DP and PT pulses, pain reproducible with palpation of the muscles of the thigh, patient has good ROM of hip although complains of pain in the thigh when leg is lifted. No rash, no crepitus. No swelling, erythema or warmth Neurologic: Normal speech and language. Face is symmetric. Moving all extremities. No gross focal neurologic deficits are appreciated. Skin: Skin is warm, dry and intact. No rash noted. Psychiatric: Mood and affect are normal. Speech and behavior are normal.  ____________________________________________   LABS (all labs  ordered are listed, but only abnormal results are displayed)  Labs Reviewed  CBC WITH DIFFERENTIAL/PLATELET - Abnormal; Notable for the following components:      Result Value   RBC 4.02 (*)    Hemoglobin 11.9 (*)    HCT 35.4 (*)    Neutro Abs 9.0 (*)    Lymphs Abs 0.5 (*)    All other components within normal limits  BASIC METABOLIC PANEL - Abnormal; Notable for the following components:   Sodium 130 (*)    Chloride 99 (*)    Glucose, Bld 149 (*)    Calcium 8.6 (*)    All other components within normal limits  CK   ____________________________________________  EKG  none  ____________________________________________  RADIOLOGY  XR pelvis and L hip/femur:  1. No definite acute osseous abnormality. 2. Unchanged avascular necrosis and resumption of the left femoral head. 3. Chronic, healed right intertrochanteric femur fracture.   Doppler: negative ____________________________________________   PROCEDURES  Procedure(s) performed: None Procedures Critical Care performed:  None ____________________________________________   INITIAL IMPRESSION / ASSESSMENT AND PLAN / ED COURSE   61 y.o. male with h/o DM, HTN, RLE AKA who presents for evaluation of LLE pain that started the day after patient was changing heavy battery of his wheelchair. Patient is afebrile, leg is warm and well perfused with normal pulses, no crepitus, no rash, no signs of cellulitis, no signs of ischemia. Patient's pain seems to be present with movement of the leg and palpation of his muscles which would be consistent with musculoskeletal pain however due to patient's chronic medical problems x-rays to rule out for occult fracture and Doppler to rule out for DVT have been ordered. In the meantime will give patient Tylenol and oxycodone for his pain.    _________________________ 12:49 PM on 05/31/2017 -----------------------------------------  Imaging studies with no acute findings. The patient has stable  avascular necrosis of the left femoral head and a healed right intertrochanteric femur fracture seen on x-ray which are unchanged from baseline. Doppler studies were negative for DVT. Patient remains with no pain at rest and is able to transition to and from the wheelchair after receiving a dose of Percocet and muscle relaxants. Labs with no acute findings and normal CK. Patient is safe to be discharged at this time with outpatient f/u. He will be provided with a small prescription of Percocet and Flexeril for muscle strain.   As part of my medical decision making, I reviewed the following data within the electronic MEDICAL RECORD NUMBER Nursing notes reviewed and incorporated, Labs reviewed , Radiograph reviewed , Notes from prior ED visits and [place] Controlled Substance Database    Pertinent labs & imaging results that were available during my care of the patient were reviewed by me and considered in my medical decision making (see chart for details).    ____________________________________________   FINAL CLINICAL IMPRESSION(S) / ED DIAGNOSES  Final diagnoses:  Left leg pain  Avascular necrosis of femoral head, left (HCC)  Muscle strain of left thigh, initial encounter      NEW MEDICATIONS STARTED DURING THIS VISIT:  This SmartLink is deprecated. Use AVSMEDLIST instead to display the medication list for a patient.   Note:  This document was prepared using Dragon voice recognition software and may include unintentional dictation errors.    Nita SickleVeronese, [place], MD 05/31/17 [phone]

## 2017-05-31 NOTE — ED Notes (Signed)
Assisted pt with pulling up pants, pt given urinal to urinate.

## 2017-05-31 NOTE — ED Notes (Signed)
Pt transferred to wheelchair and back to bed with some pain, but was able to complete task.

## 2017-05-31 NOTE — ED Notes (Signed)
Pt discharged home after verbalizing understanding of discharge instructions; nad noted. 

## 2017-09-14 ENCOUNTER — Encounter: Payer: Medicaid Other | Attending: Nurse Practitioner | Admitting: Nurse Practitioner

## 2017-09-14 DIAGNOSIS — Z933 Colostomy status: Secondary | ICD-10-CM | POA: Diagnosis not present

## 2017-09-14 DIAGNOSIS — Z89611 Acquired absence of right leg above knee: Secondary | ICD-10-CM | POA: Insufficient documentation

## 2017-09-14 DIAGNOSIS — E119 Type 2 diabetes mellitus without complications: Secondary | ICD-10-CM | POA: Diagnosis not present

## 2017-09-14 DIAGNOSIS — L853 Xerosis cutis: Secondary | ICD-10-CM | POA: Insufficient documentation

## 2017-09-14 DIAGNOSIS — F17200 Nicotine dependence, unspecified, uncomplicated: Secondary | ICD-10-CM | POA: Insufficient documentation

## 2017-09-14 DIAGNOSIS — Z993 Dependence on wheelchair: Secondary | ICD-10-CM | POA: Diagnosis not present

## 2017-09-15 NOTE — Progress Notes (Signed)
[name] ([id number]) Visit Report for 09/14/2017 Chief Complaint Document Details Patient Name: [name], [name] Date of Service: 09/14/2017 12:30 PM Medical Record Number: [id number] Patient Account Number: [id number] Date of Birth/Sex: 1955-09-07 (62 y.o. Male) Treating RN: Phillis Haggis Primary Care Provider: Franco Nones Other Clinician: Referring Provider: Franco Nones Treating Provider/Extender: Kathreen Cosier in Treatment: 0 Information Obtained from: Patient Chief Complaint He is here at the request of his PCP for left leg evaluation Electronic Signature(s) Signed: 09/14/2017 1:38:37 PM By: Bonnell Public Entered By: Bonnell Public on 09/14/2017 13:38:37 [name] ([id number]) -------------------------------------------------------------------------------- HPI Details Patient Name: [name] Date of Service: 09/14/2017 12:30 PM Medical Record Number: [id number] Patient Account Number: [id number] Date of Birth/Sex: November 05, 1955 (62 y.o. Male) Treating RN: Phillis Haggis Primary Care Provider: Franco Nones Other Clinician: Referring Provider: Franco Nones Treating Provider/Extender: Kathreen Cosier in Treatment: 0 History of Present Illness HPI Description: 09/14/17-he is here for evaluation of the left lower extremity injury. He is a resident of a group home and is accompanied by facility staff. He went to his primary care on 2/12 for a left lower extremity wound. At that appointment a wound culture was taken. A referral for wound care services was ordered on 2/26 and according to facility med rec he was started on Bactrim on 3/5. The patient and facility staff are very poor historians. It is assumed that the injury to the left leg was from scratching. He presents today with no open area, no erythema, no evidence of drainage. He is going to PCP later today for follow up. Electronic Signature(s) Signed: 09/14/2017 1:43:26 PM By: Bonnell Public Entered  By: Bonnell Public on 09/14/2017 13:43:26 [name] ([id number]) -------------------------------------------------------------------------------- Physical Exam Details Patient Name: [name] Date of Service: 09/14/2017 12:30 PM Medical Record Number: [id number] Patient Account Number: [id number] Date of Birth/Sex: [birth year] (62 y.o. Male) Treating RN: Phillis Haggis Primary Care Provider: Franco Nones Other Clinician: Referring Provider: Franco Nones Treating Provider/Extender: Kathreen Cosier in Treatment: 0 Respiratory non-labored. clear/diminished to auscultation. Cardiovascular s1 s2 regular, rate controlled. LLE- xerosis, warm to touch, no erythema, no open areas, no drainage. Musculoskeletal wheelchair dependent. Psychiatric does not appear to have good insight or judgement to medical needs. Electronic Signature(s) Signed: 09/14/2017 1:44:18 PM By: Bonnell Public Entered By: Bonnell Public on 09/14/2017 13:44:18 [name] ([id number]) -------------------------------------------------------------------------------- Physician Orders Details Patient Name: [name] Date of Service: 09/14/2017 12:30 PM Medical Record Number: [id number] Patient Account Number: [id number] Date of Birth/Sex: 1955/09/09 (62 y.o. Male) Treating RN: Phillis Haggis Primary Care Provider: Franco Nones Other Clinician: Referring Provider: Franco Nones Treating Provider/Extender: Kathreen Cosier in Treatment: 0 Verbal / Phone Orders: Yes Clinician: Ashok Cordia, Debi Read Back and Verified: Yes Diagnosis Coding Discharge From [facility] Services o Discharge from Wound Care Center - Please call our office if you have any questions or concerns. Electronic Signature(s) Signed: 09/14/2017 4:33:49 PM By: Alejandro Mulling Signed: 09/14/2017 5:22:04 PM By: Bonnell Public Entered By: Alejandro Mulling on 09/14/2017 13:17:12 [name]  ([id number]) -------------------------------------------------------------------------------- Problem List Details Patient Name: [name] Date of Service: 09/14/2017 12:30 PM Medical Record Number: [id number] Patient Account Number: [id number] Date of Birth/Sex: March 04, 1956 (63 y.o. 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 Male) Treating RN: Phillis HaggisPinkerton, Debi Primary Care Provider: Franco NonesLINDLEY, CHERYL Other Clinician: Referring Provider: Franco NonesLINDLEY, CHERYL Treating Provider/Extender: Kathreen Cosieroulter, Chriss Mannan Weeks in Treatment: 0 Subjective Chief Complaint Information obtained from Patient He is here at the request of his PCP for left leg evaluation History of Present Illness (HPI) 09/14/17-he is here for evaluation of the left lower extremity injury. He is a resident of a group home and is accompanied by facility staff. He went to his primary care on 2/12 for a left lower extremity wound. At that appointment a wound culture was taken. A referral for wound care services was ordered on 2/26 and  according to facility med rec he was started on Bactrim on 3/5. The patient and facility staff are very poor historians. It is assumed that the injury to the left leg was from scratching. He presents today with no open area, no erythema, no evidence of drainage. He is going to PCP later today for follow up. Wound History Patient presents with 1 open wound that has been present for approximately 3 months. Patient has been treating wound in the following manner: wrap kerlix. Laboratory tests have been performed in the last month. Patient reportedly has tested positive for an antibiotic resistant organism. Patient reportedly has not tested positive for osteomyelitis. Patient History Information obtained from Patient. Allergies Cephalosporins, penicillin, cefazolin Family History Heart Disease - Siblings, Hypertension - Siblings, Lung Disease - Siblings, No family history of Cancer, Diabetes, Hereditary Spherocytosis, Kidney Disease, Seizures, Stroke, Thyroid Problems, Tuberculosis. Social History Current every day smoker, Marital Status - Single, Alcohol Use - Never - history of ETOH abuse, Drug Use - Prior History, Caffeine Use - Daily. Medical History Eyes Denies history of Cataracts, Glaucoma, Optic Neuritis Ear/Nose/Mouth/Throat Patient has history of Chronic sinus problems/congestion Denies history of Middle ear problems Hematologic/Lymphatic Patient has history of Anemia Denies history of Hemophilia, Human Immunodeficiency Virus, Lymphedema, Sickle Cell Disease Respiratory Denies history of Aspiration, Asthma, Chronic Obstructive Pulmonary Disease (COPD), Pneumothorax, Sleep Apnea, Tuberculosis Cardiovascular [name], [name] ([id number]) Patient has history of Arrhythmia Denies history of Angina, Congestive Heart Failure, Coronary Artery Disease, Deep Vein Thrombosis, Hypertension, Hypotension, Myocardial Infarction, Peripheral Arterial Disease, Peripheral Venous Disease,  Phlebitis, Vasculitis Gastrointestinal Denies history of Cirrhosis , Colitis, Crohn s, Hepatitis A, Hepatitis B, Hepatitis C Endocrine Patient has history of Type II Diabetes Immunological Denies history of Lupus Erythematosus, Raynaud s, Scleroderma Neurologic Denies history of Neuropathy Patient is treated with Controlled Diet. Blood sugar is tested. Medical And Surgical History Notes Gastrointestinal gallbladder calculus Musculoskeletal R AKA Review of Systems (ROS) Constitutional Symptoms (General Health) Denies complaints or symptoms of Fatigue, Fever, Chills, Marked Weight Change. Eyes Complains or has symptoms of Glasses / Contacts - reading glasses. Denies complaints or symptoms of Dry Eyes, Vision Changes. Ear/Nose/Mouth/Throat Denies complaints or symptoms of Difficult clearing ears, Sinusitis. Hematologic/Lymphatic Denies complaints or symptoms of Bleeding / Clotting Disorders, Human Immunodeficiency Virus. Respiratory Denies complaints or symptoms of Chronic or frequent coughs, Shortness of Breath. Cardiovascular Denies complaints or symptoms of Chest pain, LE edema. Gastrointestinal The patient has no complaints or symptoms. Endocrine The patient has no complaints or symptoms. Genitourinary The patient has no complaints  or symptoms. Immunological The patient has no complaints or symptoms. Integumentary (Skin) The patient has no complaints or symptoms. Musculoskeletal The patient has no complaints or symptoms. Neurologic The patient has no complaints or symptoms. Oncologic The patient has no complaints or symptoms. Psychiatric The patient has no complaints or symptoms. [name], [name] ([id number]) Objective Constitutional Vitals Time Taken: 12:45 PM, Temperature: 98.1 F, Respiratory Rate: 18 breaths/min. Respiratory non-labored. clear/diminished to auscultation. Cardiovascular s1 s2 regular, rate controlled. LLE- xerosis, warm to touch, no erythema,  no open areas, no drainage. Musculoskeletal wheelchair dependent. Psychiatric does not appear to have good insight or judgement to medical needs. 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 consult only no indication to return at this time Electronic Signature(s) Signed: 09/14/2017 1:48:10 PM By: Bonnell Public Previous Signature: 09/14/2017 1:44:48 PM Version By: Hollice Espy, Molly Maduro ([id number]) Entered By: Bonnell Public on 09/14/2017 13:48:09 [name] ([id number]) -------------------------------------------------------------------------------- ROS/PFSH Details Patient Name: [name] Date of Service: 09/14/2017 12:30 PM Medical Record Number: [id number] Patient Account Number: [id number] Date of Birth/Sex: [birth year] (62 y.o. Male) Treating RN: Curtis Sites Primary Care Provider: Franco Nones Other Clinician: Referring Provider: Franco Nones Treating Provider/Extender: Kathreen Cosier in Treatment: 0 Information Obtained From Patient Wound History Do you currently have one or more open woundso Yes How many open wounds do you currently haveo 1 Approximately how long have you had your woundso 3 months How have you been treating your wound(s) until nowo wrap kerlix Has your wound(s) ever healed and then re-openedo No Have you had any lab work done in the past montho Yes Have you tested positive for osteomyelitis (bone infection)o No Constitutional Symptoms (General Health) Complaints and Symptoms: Negative for: Fatigue; Fever; Chills; Marked Weight Change Eyes Complaints and Symptoms: Positive for: Glasses / Contacts - reading  glasses Negative for: Dry Eyes; Vision Changes Medical History: Negative for: Cataracts; Glaucoma; Optic Neuritis Ear/Nose/Mouth/Throat Complaints and Symptoms: Negative for: Difficult clearing ears; Sinusitis Medical History: Positive for: Chronic sinus problems/congestion Negative for: Middle ear problems Hematologic/Lymphatic Complaints and Symptoms: Negative for: Bleeding / Clotting Disorders; Human Immunodeficiency Virus Medical History: Positive for: Anemia Negative for: Hemophilia; Human Immunodeficiency Virus; Lymphedema; Sickle Cell Disease Respiratory Complaints and Symptoms: Negative for: Chronic or frequent coughs; Shortness of Breath Medical History: Negative for: Aspiration; Asthma; Chronic Obstructive Pulmonary Disease (COPD); Pneumothorax; Sleep Apnea; Deluna, Britton ([id number]) Tuberculosis Cardiovascular Complaints and Symptoms: Negative for: Chest pain; LE edema Medical History: Positive for: Arrhythmia Negative for: Angina; Congestive Heart Failure; Coronary Artery Disease; Deep Vein Thrombosis; Hypertension; Hypotension; Myocardial Infarction; Peripheral Arterial Disease; Peripheral Venous Disease; Phlebitis; Vasculitis Gastrointestinal Complaints and Symptoms: No Complaints or Symptoms Medical History: Negative for: Cirrhosis ; Colitis; Crohnos; Hepatitis A; Hepatitis B; Hepatitis C Past Medical History Notes: gallbladder calculus Endocrine Complaints and Symptoms: No Complaints or Symptoms Medical History: Positive for: Type II Diabetes Treated with: Diet Blood sugar tested every day: Yes Tested : Genitourinary Complaints and Symptoms: No Complaints or Symptoms Immunological Complaints and Symptoms: No Complaints or Symptoms Medical History: Negative for: Lupus Erythematosus; Raynaudos; Scleroderma Integumentary (Skin) Complaints and Symptoms: No Complaints or Symptoms Musculoskeletal Complaints and Symptoms: No Complaints or  Symptoms Medical History: Past Medical History NotesDRAIDEN Higgins, [name] ([id number]) Neurologic Complaints and  Symptoms: No Complaints or Symptoms Medical History: Negative for: Neuropathy Oncologic Complaints and Symptoms: No Complaints or Symptoms Psychiatric Complaints and Symptoms: No Complaints or Symptoms HBO Extended History Items Ear/Nose/Mouth/Throat: Chronic sinus problems/congestion Immunizations Pneumococcal Vaccine: Received Pneumococcal Vaccination: No Immunization Notes: up to date Implantable Devices Family and Social History Cancer: No; Diabetes: No; Heart Disease: Yes - Siblings; Hereditary Spherocytosis: No; Hypertension: Yes - Siblings; Kidney Disease: No; Lung Disease: Yes - Siblings; Seizures: No; Stroke: No; Thyroid Problems: No; Tuberculosis: No; Current every day smoker; Marital Status - Single; Alcohol Use: Never - history of ETOH abuse; Drug Use: Prior History; Caffeine Use: Daily; Financial Concerns: No; Food, Clothing or Shelter Needs: No; Support System Lacking: No; Transportation Concerns: No; Advanced Directives: No; Patient does not want information on Advanced Directives Electronic Signature(s) Signed: 09/14/2017 5:02:17 PM By: Curtis Sites Signed: 09/14/2017 5:22:04 PM By: Bonnell Public Entered By: Curtis Sites on 09/14/2017 13:00:03 [name] ([id number]) -------------------------------------------------------------------------------- SuperBill Details Patient Name: [name] Date of Service: 09/14/2017 Medical Record Number: [id number] Patient Account Number: [id number] Date of Birth/Sex: 1955-08-02 (62 y.o. 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 Facility Procedures CPT4 Code: [id number] Description: 99213 - WOUND CARE VISIT-LEV 3 EST PT Modifier: Quantity: 1 Physician Procedures CPT4 Code: [id number] Description: 99202 - WC PHYS LEVEL 2 - NEW PT ICD-10 Diagnosis Description F17.208 Nicotine dependence, unspecified, with other nicotine-indu Z99.3 Dependence on 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

## 2017-09-15 NOTE — Progress Notes (Signed)
CAINAN, TRULL ([id number]) Visit Report for 09/14/2017 Abuse/Suicide Risk Screen Details Patient Name: [name], [name] Date of Service: 09/14/2017 12:30 PM Medical Record Number: [id number] Patient Account Number: [id number] Date of Birth/Sex: March 21, 1956 (62 y.o. Male) Treating RN: Curtis Sites Primary Care Nykerria Macconnell: Franco Nones Other Clinician: Referring Mry Lamia: Franco Nones Treating Alinda Egolf/Extender: Kathreen Cosier in Treatment: 0 Abuse/Suicide Risk Screen Items Answer ABUSE/SUICIDE RISK SCREEN: Has anyone close to you tried to hurt or harm you recentlyo No Do you feel uncomfortable with anyone in your familyo No Has anyone forced you do things that you didnot want to doo No Do you have any thoughts of harming yourselfo No Patient displays signs or symptoms of abuse and/or neglect. No Electronic Signature(s) Signed: 09/14/2017 5:02:17 PM By: Curtis Sites Entered By: Curtis Sites on 09/14/2017 12:47:19 [name] ([id number]) -------------------------------------------------------------------------------- Activities of Daily Living Details Patient Name: [name] Date of Service: 09/14/2017 12:30 PM Medical Record Number: [id number] Patient Account Number: [id number] Date of Birth/Sex: 1955-08-21 (62 y.o. Male) Treating RN: Curtis Sites Primary Care Amir Fick: Franco Nones Other Clinician: Referring Jelissa Espiritu: Franco Nones Treating Katherine Tout/Extender: Kathreen Cosier in Treatment: 0 Activities of Daily Living Items Answer Activities of Daily Living (Please select one for each item) Drive Automobile Not Able Take Medications Need Assistance Use Telephone Completely Able Care for Appearance Need Assistance Use Toilet Need Assistance Bath / Shower Need Assistance Dress Self Need Assistance Feed Self Completely Able Walk Not Able Get In / Out Bed Need Assistance Housework Need Assistance Prepare Meals Need Assistance Handle Money Need  Assistance Shop for Self Need Assistance Electronic Signature(s) Signed: 09/14/2017 5:02:17 PM By: Curtis Sites Entered By: Curtis Sites on 09/14/2017 12:47:48 [name] ([id number]) -------------------------------------------------------------------------------- Education Assessment Details Patient Name: [name] Date of Service: 09/14/2017 12:30 PM Medical Record Number: [id number] Patient Account Number: [id number] Date of Birth/Sex: 06-Sep-1955 (62 y.o. Male) Treating RN: Curtis Sites Primary Care Dewey Neukam: Franco Nones Other Clinician: Referring Kikue Gerhart: Franco Nones Treating Niomi Valent/Extender: Kathreen Cosier in Treatment: 0 Primary Learner Assessed: Caregiver Reason Patient is not Primary Learner: wound location Learning Preferences/Education Level/Primary Language Learning Preference: Explanation, Demonstration Highest Education Level: High School Preferred Language: English Cognitive Barrier Assessment/Beliefs Language Barrier: No Translator Needed: No Memory Deficit: No Emotional Barrier: No Cultural/Religious Beliefs Affecting Medical Care: No Physical Barrier Assessment Impaired Vision: No Impaired Hearing: No Decreased Hand dexterity: No Knowledge/Comprehension Assessment Knowledge Level: Medium Comprehension Level: Medium Ability to understand written Medium instructions: Ability to understand verbal Medium instructions: Motivation Assessment Anxiety Level: Calm Cooperation: Cooperative Education Importance: Acknowledges Need Interest in Health Problems: Asks Questions Perception: Coherent Willingness to Engage in Self- Medium Management Activities: Readiness to Engage in Self- Medium Management Activities: Electronic Signature(s) Signed: 09/14/2017 5:02:17 PM By: Curtis Sites Entered By: Curtis Sites on 09/14/2017 12:50:07 [name]  ([id number]) -------------------------------------------------------------------------------- Fall Risk Assessment Details Patient Name: [name] Date of Service: 09/14/2017 12:30 PM Medical Record Number: [id number] Patient Account Number: [id number] Date of Birth/Sex: 1955-08-10 (62 y.o. Male) Treating RN: Curtis Sites Primary Care Harith Mccadden: Franco Nones Other Clinician: Referring Jessilyn Catino: Franco Nones Treating Demaya Hardge/Extender: Kathreen Cosier in Treatment: 0 Fall Risk Assessment Items Have you had 2 or more falls in the last 12 monthso 0 No Have you had any fall that resulted in injury in the last 12 monthso 0 No FALL RISK ASSESSMENT: History of falling - immediate or within 3 months 25 Yes Secondary diagnosis 0 No Ambulatory aid None/bed rest/wheelchair/nurse 0 Yes Crutches/cane/walker 0 No Furniture 0 No IV Access/Saline Lock  0 No Gait/Training Normal/bed rest/immobile 0 No Weak 10 Yes Impaired 0 No Mental Status Oriented to own ability 0 Yes Electronic Signature(s) Signed: 09/14/2017 5:02:17 PM By: Curtis Sitesorthy, Joanna Entered By: Curtis Sitesorthy, Joanna on 09/14/2017 12:50:33 Shawn SchlichterANDLES, Sebastyan ([id number]) -------------------------------------------------------------------------------- Foot Assessment Details Patient Name: Shawn SchlichterANDLES, Nhia Date of Service: 09/14/2017 12:30 PM Medical Record Number: [id number] Patient Account Number: [id number] Date of Birth/Sex: 1956-02-02 [age] y.o. Male) Treating RN: Curtis Sitesorthy, Joanna Primary Care Patriciann Becht: Franco NonesLINDLEY, CHERYL Other Clinician: Referring Khalil Belote: Franco NonesLINDLEY, CHERYL Treating Daulton Harbaugh/Extender: Kathreen Cosieroulter, Leah Weeks in Treatment: 0 Foot Assessment Items Site Locations + = Sensation present, - = Sensation absent, C = Callus, U = Ulcer R = Redness, W = Warmth, M = Maceration, PU = Pre-ulcerative lesion F = Fissure, S = Swelling, D = Dryness Assessment Right: Left: Other Deformity: No No Prior Foot Ulcer: No No Prior Amputation: No  No Charcot Joint: No No Ambulatory Status: Non-ambulatory Assistance Device: Wheelchair Gait: Surveyor, miningUnsteady Electronic Signature(s) Signed: 09/14/2017 5:02:17 PM By: Curtis Sitesorthy, Joanna Entered By: Curtis Sitesorthy, Joanna on 09/14/2017 13:00:28 Shawn SchlichterANDLES, Sanjit ([id number]) -------------------------------------------------------------------------------- Nutrition Risk Assessment Details Patient Name: Shawn SchlichterANDLES, Uzziel Date of Service: 09/14/2017 12:30 PM Medical Record Number: [id number] Patient Account Number: [id number] Date of Birth/Sex: 1956-02-02 [age] y.o. Male) Treating RN: Curtis Sitesorthy, Joanna Primary Care Raeya Merritts: Franco NonesLINDLEY, CHERYL Other Clinician: Referring Koby Hartfield: Franco NonesLINDLEY, CHERYL Treating Lorrena Goranson/Extender: Kathreen Cosieroulter, Leah Weeks in Treatment: 0 Height (in): Weight (lbs): Body Mass Index (BMI): Nutrition Risk Assessment Items NUTRITION RISK SCREEN: I have an illness or condition that made me change the kind and/or amount of 0 No food I eat I eat fewer than two meals per day 0 No I eat few fruits and vegetables, or milk products 0 No I have three or more drinks of beer, liquor or wine almost every day 0 No I have tooth or mouth problems that make it hard for me to eat 0 No I don't always have enough money to buy the food I need 0 No I eat alone most of the time 0 No I take three or more different prescribed or over-the-counter drugs a day 1 Yes Without wanting to, I have lost or gained 10 pounds in the last six months 0 No I am not always physically able to shop, cook and/or feed myself 0 No Nutrition Protocols Good Risk Protocol 0 No interventions needed Moderate Risk Protocol Electronic Signature(s) Signed: 09/14/2017 5:02:17 PM By: Curtis Sitesorthy, Joanna Entered By: Curtis Sitesorthy, Joanna on 09/14/2017 12:50:39

## 2017-09-19 NOTE — Progress Notes (Signed)
[name], [name] ([id number]) Visit Report for 09/14/2017 Allergy List Details Patient Name: [name], [name] Date of Service: 09/14/2017 12:30 PM Medical Record Number: [id number] Patient Account Number: [id number] Date of Birth/Sex: 04-Feb-1956 [age] y.o. Male) Treating RN: Curtis Sitesorthy, Joanna Primary Care Cherisse Carrell: Franco NonesLINDLEY, CHERYL Other Clinician: Referring Jame Seelig: Franco NonesLINDLEY, CHERYL Treating Liam Cammarata/Extender: Bonnell Publicoulter, Leah Weeks in Treatment: 0 Allergies Active Allergies Cephalosporins penicillin cefazolin Allergy Notes Electronic Signature(s) Signed: 09/14/2017 5:02:17 PM By: Curtis Sitesorthy, Joanna Entered By: Curtis Sitesorthy, Joanna on 09/14/2017 12:47:01 [name], [name] ([id number]) -------------------------------------------------------------------------------- Arrival Information Details Patient Name: [name], [name] Date of Service: 09/14/2017 12:30 PM Medical Record Number: [id number] Patient Account Number: [id number] Date of Birth/Sex: 04-Feb-1956 [age] y.o. Male) Treating RN: Curtis Sitesorthy, Joanna Primary Care Ricahrd Schwager: Franco NonesLINDLEY, CHERYL Other Clinician: Referring Zakeria Kulzer: Franco NonesLINDLEY, CHERYL Treating Elizabeth Paulsen/Extender: Kathreen Cosieroulter, Leah Weeks in Treatment: 0 Visit Information Patient Arrived: Wheel Chair Arrival Time: 12:43 Accompanied By: staff Transfer Assistance: Manual Patient Identification Verified: Yes Secondary Verification Process Completed: Yes Patient Has Alerts: Yes Patient Alerts: DMII Electronic Signature(s) Signed: 09/14/2017 5:02:17 PM By: Curtis Sitesorthy, Joanna Entered By: Curtis Sitesorthy, Joanna on 09/14/2017 12:45:20 [name], [name] ([id number]) -------------------------------------------------------------------------------- Clinic Level of Care Assessment Details Patient Name: [name], [name] Date of Service: 09/14/2017 12:30 PM Medical Record Number: [id number] Patient Account Number: [id number] Date of Birth/Sex: 04-Feb-1956 4(61 y.o. Male) Treating RN: Phillis HaggisPinkerton, Debi Primary Care Furious Chiarelli: Franco NonesLINDLEY, CHERYL  Other Clinician: Referring Malone Vanblarcom: Franco NonesLINDLEY, CHERYL Treating Navy Rothschild/Extender: Kathreen Cosieroulter, Leah Weeks in Treatment: 0 Clinic Level of Care Assessment Items TOOL 2 Quantity Score X - Use when only an EandM is performed on the INITIAL visit 1 0 ASSESSMENTS - Nursing Assessment / Reassessment X - General Physical Exam (combine w/ comprehensive assessment (listed just below) when 1 20 performed on new pt. evals) X- 1 25 Comprehensive Assessment (HX, ROS, Risk Assessments, Wounds Hx, etc.) ASSESSMENTS - Wound and Skin Assessment / Reassessment []  - Simple Wound Assessment / Reassessment - one wound 0 []  - 0 Complex Wound Assessment / Reassessment - multiple wounds []  - 0 Dermatologic / Skin Assessment (not related to wound area) ASSESSMENTS - Ostomy and/or Continence Assessment and Care []  - Incontinence Assessment and Management 0 []  - 0 Ostomy Care Assessment and Management (repouching, etc.) PROCESS - Coordination of Care X - Simple Patient / Family Education for ongoing care 1 15 []  - 0 Complex (extensive) Patient / Family Education for ongoing care []  - 0 Staff obtains ChiropractorConsents, Records, Test Results / Process Orders []  - 0 Staff telephones HHA, Nursing Homes / Clarify orders / etc []  - 0 Routine Transfer to another Facility (non-emergent condition) []  - 0 Routine Hospital Admission (non-emergent condition) []  - 0 New Admissions / Manufacturing engineernsurance Authorizations / Ordering NPWT, Apligraf, etc. []  - 0 Emergency Hospital Admission (emergent condition) X- 1 10 Simple Discharge Coordination []  - 0 Complex (extensive) Discharge Coordination PROCESS - Special Needs []  - Pediatric / Minor Patient Management 0 []  - 0 Isolation Patient Management Mcmanamon, Domonik ([id number]) []  - 0 Hearing / Language / Visual special needs []  - 0 Assessment of Community assistance (transportation, D/C planning, etc.) []  - 0 Additional assistance / Altered mentation []  - 0 Support Surface(s)  Assessment (bed, cushion, seat, etc.) INTERVENTIONS - Wound Cleansing / Measurement X - Wound Imaging (photographs - any number of wounds) 1 5 []  - 0 Wound Tracing (instead of photographs) []  - 0 Simple Wound Measurement - one wound []  - 0 Complex Wound Measurement - multiple wounds []  - 0 Simple Wound Cleansing - one wound []  - 0 Complex Wound Cleansing -  multiple wounds INTERVENTIONS - Wound Dressings []  - Small Wound Dressing one or multiple wounds 0 []  - 0 Medium Wound Dressing one or multiple wounds []  - 0 Large Wound Dressing one or multiple wounds []  - 0 Application of Medications - injection INTERVENTIONS - Miscellaneous []  - External ear exam 0 []  - 0 Specimen Collection (cultures, biopsies, blood, body fluids, etc.) []  - 0 Specimen(s) / Culture(s) sent or taken to Lab for analysis []  - 0 Patient Transfer (multiple staff / Michiel Sites Lift / Similar devices) []  - 0 Simple Staple / Suture removal (25 or less) []  - 0 Complex Staple / Suture removal (26 or more) []  - 0 Hypo / Hyperglycemic Management (close monitor of Blood Glucose) X- 1 15 Ankle / Brachial Index (ABI) - do not check if billed separately Has the patient been seen at the hospital within the last three years: Yes Total Score: 90 Level Of Care: New/Established - Level 3 Electronic Signature(s) Signed: 09/14/2017 4:33:49 PM By: Alejandro Mulling Entered By: Alejandro Mulling on 09/14/2017 15:19:24 [name] ([id number]) -------------------------------------------------------------------------------- Encounter Discharge Information Details Patient Name: [name] Date of Service: 09/14/2017 12:30 PM Medical Record Number: [id number] Patient Account Number: [id number] Date of Birth/Sex: 02/25/1956 (62 y.o. Male) Treating RN: Phillis Haggis Primary Care Tanaisha Pittman: Franco Nones Other Clinician: Referring Jahfari Ambers: Franco Nones Treating Coreen Shippee/Extender: Kathreen Cosier in Treatment:  0 Encounter Discharge Information Items Discharge Pain Level: 0 Discharge Condition: Stable Ambulatory Status: Wheelchair Other (Note Discharge Destination: Required) Transportation: Private Auto Accompanied By: caregiver Schedule Follow-up Appointment: No Medication Reconciliation completed and No provided to Patient/Care Amanie Mcculley: Provided on Clinical Summary of Care: 09/14/2017 Form Type Recipient Paper Patient RR Electronic Signature(s) Signed: 09/18/2017 8:29:01 AM By: Gwenlyn Perking Entered By: Gwenlyn Perking on 09/14/2017 13:24:44 [name] ([id number]) -------------------------------------------------------------------------------- Lower Extremity Assessment Details Patient Name: [name] Date of Service: 09/14/2017 12:30 PM Medical Record Number: [id number] Patient Account Number: [id number] Date of Birth/Sex: Mar 13, 1956 (62 y.o. Male) Treating RN: Curtis Sites Primary Care Lorieann Argueta: Franco Nones Other Clinician: Referring Franky Reier: Franco Nones Treating Sanja Elizardo/Extender: Kathreen Cosier in Treatment: 0 Vascular Assessment Pulses: Dorsalis Pedis Palpable: [Left:Yes] Doppler Audible: [Left:Yes] Posterior Tibial Palpable: [Left:Yes] Doppler Audible: [Left:Yes] Extremity colors, hair growth, and conditions: Extremity Color: [Left:Hyperpigmented] Hair Growth on Extremity: [Left:No] Temperature of Extremity: [Left:Warm] Capillary Refill: [Left:< 3 seconds] Toe Nail Assessment Left: Right: Thick: Yes Discolored: Yes Deformed: No Improper Length and Hygiene: Yes Electronic Signature(s) Signed: 09/14/2017 5:02:17 PM By: Curtis Sites Entered By: Curtis Sites on 09/14/2017 13:02:06 [name] ([id number]) -------------------------------------------------------------------------------- Multi Wound Chart Details Patient Name: [name] Date of Service: 09/14/2017 12:30 PM Medical Record Number: [id number] Patient Account Number:  [id number] Date of Birth/Sex: April 27, 1956 (62 y.o. Male) Treating RN: Ashok Cordia, Debi Primary Care Suzette Flagler: Franco Nones Other Clinician: Referring Jenisis Harmsen: Franco Nones Treating Ramar Nobrega/Extender: Kathreen Cosier in Treatment: 0 Vital Signs Height(in): Pulse(bpm): Weight(lbs): Blood Pressure(mmHg): Body Mass Index(BMI): Temperature(F): 98.1 Respiratory Rate 18 (breaths/min): Wound Assessments Treatment Notes Electronic Signature(s) Signed: 09/14/2017 1:38:15 PM By: Bonnell Public Entered By: Bonnell Public on 09/14/2017 13:38:15 [name] ([id number]) -------------------------------------------------------------------------------- Multi-Disciplinary Care Plan Details Patient Name: [name] Date of Service: 09/14/2017 12:30 PM Medical Record Number: [id number] Patient Account Number: [id number] Date of Birth/Sex: Mar 02, 1956 (62 y.o. Male) Treating RN: Phillis Haggis Primary Care Vibha Ferdig: Franco Nones Other Clinician: Referring Daylee Delahoz: Franco Nones Treating Iman Orourke/Extender: Kathreen Cosier in Treatment: 0 Active Inactive Electronic Signature(s) Signed: 09/14/2017 4:33:49 PM By: Alejandro Mulling Entered By: Alejandro Mulling on 09/14/2017 13:14:52 Crissman, Jakolby (  [id number]) -------------------------------------------------------------------------------- Pain Assessment Details Patient Name: JERAULD, BOSTWICK Date of Service: 09/14/2017 12:30 PM Medical Record Number: [id number] Patient Account Number: [id number] Date of Birth/Sex: 1955-07-13 (62 y.o. Male) Treating RN: Curtis Sites Primary Care Chipper Koudelka: Franco Nones Other Clinician: Referring Jonavon Trieu: Franco Nones Treating Jazlen Ogarro/Extender: Kathreen Cosier in Treatment: 0 Active Problems Location of Pain Severity and Description of Pain Patient Has Paino Yes Site Locations Pain Location: Pain in Ulcers With Dressing Change: Yes Duration of the Pain. Constant / Intermittento  Intermittent Pain Management and Medication Current Pain Management: Notes Topical or injectable lidocaine is offered to patient for acute pain when surgical debridement is performed. If needed, Patient is instructed to use over the counter pain medication for the following 24-48 hours after debridement. Wound care MDs do not prescribed pain medications. Patient has chronic pain or uncontrolled pain. Patient has been instructed to make an appointment with their Primary Care Physician for pain management. Electronic Signature(s) Signed: 09/14/2017 5:02:17 PM By: Curtis Sites Entered By: Curtis Sites on 09/14/2017 12:45:43 [name] ([id number]) -------------------------------------------------------------------------------- Patient/Caregiver Education Details Patient Name: [name] Date of Service: 09/14/2017 12:30 PM Medical Record Number: [id number] Patient Account Number: [id number] Date of Birth/Gender: May 29, 1956 (62 y.o. Male) Treating RN: Phillis Haggis Primary Care Physician: Franco Nones Other Clinician: Referring Physician: Franco Nones Treating Physician/Extender: Kathreen Cosier in Treatment: 0 Education Assessment Education Provided To: Patient and Caregiver Education Topics Provided Wound/Skin Impairment: Handouts: Other: Please call our office if you have any questions or concerns. Methods: Explain/Verbal Responses: State content correctly Electronic Signature(s) Signed: 09/14/2017 4:33:49 PM By: Alejandro Mulling Entered By: Alejandro Mulling on 09/14/2017 13:17:54 [name] ([id number]) -------------------------------------------------------------------------------- Vitals Details Patient Name: [name] Date of Service: 09/14/2017 12:30 PM Medical Record Number: [id number] Patient Account Number: [id number] Date of Birth/Sex: [birth year] (62 y.o. Male) Treating RN: Curtis Sites Primary Care Marveline Profeta: Franco Nones Other  Clinician: Referring Read Bonelli: Franco Nones Treating Preeti Winegardner/Extender: Kathreen Cosier in Treatment: 0 Vital Signs Time Taken: 12:45 Temperature (F): 98.1 Respiratory Rate (breaths/min): 18 Reference Range: 80 - 120 mg / dl Electronic Signature(s) Signed: 09/14/2017 5:02:17 PM By: Curtis Sites Entered By: Curtis Sites on 09/14/2017 12:45:59

## 2017-09-28 ENCOUNTER — Ambulatory Visit: Payer: Self-pay | Admitting: Podiatry

## 2018-01-02 ENCOUNTER — Encounter: Payer: Medicaid Other | Attending: Physician Assistant | Admitting: Physician Assistant

## 2018-01-02 DIAGNOSIS — Z8249 Family history of ischemic heart disease and other diseases of the circulatory system: Secondary | ICD-10-CM | POA: Diagnosis not present

## 2018-01-02 DIAGNOSIS — E1151 Type 2 diabetes mellitus with diabetic peripheral angiopathy without gangrene: Secondary | ICD-10-CM | POA: Insufficient documentation

## 2018-01-02 DIAGNOSIS — Z933 Colostomy status: Secondary | ICD-10-CM | POA: Diagnosis not present

## 2018-01-02 DIAGNOSIS — Z8619 Personal history of other infectious and parasitic diseases: Secondary | ICD-10-CM | POA: Diagnosis not present

## 2018-01-02 DIAGNOSIS — Z993 Dependence on wheelchair: Secondary | ICD-10-CM | POA: Insufficient documentation

## 2018-01-02 DIAGNOSIS — L89313 Pressure ulcer of right buttock, stage 3: Secondary | ICD-10-CM | POA: Diagnosis not present

## 2018-01-02 DIAGNOSIS — E11622 Type 2 diabetes mellitus with other skin ulcer: Secondary | ICD-10-CM | POA: Diagnosis present

## 2018-01-02 DIAGNOSIS — D649 Anemia, unspecified: Secondary | ICD-10-CM | POA: Diagnosis not present

## 2018-01-02 DIAGNOSIS — Z881 Allergy status to other antibiotic agents status: Secondary | ICD-10-CM | POA: Diagnosis not present

## 2018-01-02 DIAGNOSIS — L89323 Pressure ulcer of left buttock, stage 3: Secondary | ICD-10-CM | POA: Diagnosis not present

## 2018-01-02 DIAGNOSIS — K7030 Alcoholic cirrhosis of liver without ascites: Secondary | ICD-10-CM | POA: Insufficient documentation

## 2018-01-02 DIAGNOSIS — Z880 Allergy status to penicillin: Secondary | ICD-10-CM | POA: Diagnosis not present

## 2018-01-02 DIAGNOSIS — F17210 Nicotine dependence, cigarettes, uncomplicated: Secondary | ICD-10-CM | POA: Diagnosis not present

## 2018-01-02 DIAGNOSIS — Z89611 Acquired absence of right leg above knee: Secondary | ICD-10-CM | POA: Insufficient documentation

## 2018-01-02 DIAGNOSIS — I499 Cardiac arrhythmia, unspecified: Secondary | ICD-10-CM | POA: Diagnosis not present

## 2018-01-03 NOTE — Progress Notes (Signed)
[name], [name] ([id number]) Visit Report for 01/02/2018 Abuse/Suicide Risk Screen Details Patient Name: [name], Mir Date of Service: 01/02/2018 8:00 AM Medical Record Number: [id number] Patient Account Number: [id number] Date of Birth/Sex: 1955-11-20 (61 y.o. M) Treating RN: Huel CoventryWoody, Kim Primary Care Grant Swager: Franco NonesLINDLEY, CHERYL Other Clinician: Referring Merlyn Bollen: Franco NonesLINDLEY, CHERYL Treating Almin Livingstone/Extender: STONE III, HOYT Weeks in Treatment: 0 Abuse/Suicide Risk Screen Items Answer ABUSE/SUICIDE RISK SCREEN: Has anyone close to you tried to hurt or harm you recentlyo No Do you feel uncomfortable with anyone in your familyo No Has anyone forced you do things that you didnot want to doo No Do you have any thoughts of harming yourselfo No Patient displays signs or symptoms of abuse and/or neglect. No Electronic Signature(s) Signed: 01/02/2018 5:19:43 PM By: Elliot GurneyWoody, BSN, RN, CWS, Kim RN, BSN Entered By: Elliot GurneyWoody, BSN, RN, CWS, Kim on 01/02/2018 08:18:41 [name], [name] ([id number]) -------------------------------------------------------------------------------- Activities of Daily Living Details Patient Name: [name], [name] Date of Service: 01/02/2018 8:00 AM Medical Record Number: [id number] Patient Account Number: [id number] Date of Birth/Sex: 1955-11-20 (61 y.o. M) Treating RN: Huel CoventryWoody, Kim Primary Care Isobella Ascher: Franco NonesLINDLEY, CHERYL Other Clinician: Referring Talton Delpriore: Franco NonesLINDLEY, CHERYL Treating Laneice Meneely/Extender: STONE III, HOYT Weeks in Treatment: 0 Activities of Daily Living Items Answer Activities of Daily Living (Please select one for each item) Drive Automobile Not Able Take Medications Need Assistance Use Telephone Need Assistance Care for Appearance Need Assistance Use Toilet Need Assistance Bath / Shower Need Assistance Dress Self Need Assistance Feed Self Need Assistance Walk Need Assistance Get In / Out Bed Need Assistance Housework Need Assistance Prepare Meals Need  Assistance Handle Money Need Assistance Shop for Self Need Assistance Electronic Signature(s) Signed: 01/02/2018 5:19:43 PM By: Elliot GurneyWoody, BSN, RN, CWS, Kim RN, BSN Entered By: Elliot GurneyWoody, BSN, RN, CWS, Kim on 01/02/2018 08:18:59 [name], [name] ([id number]) -------------------------------------------------------------------------------- Education Assessment Details Patient Name: [name], [name] Date of Service: 01/02/2018 8:00 AM Medical Record Number: [id number] Patient Account Number: [id number] Date of Birth/Sex: 1955-11-20 (61 y.o. M) Treating RN: Huel CoventryWoody, Kim Primary Care Maile Linford: Franco NonesLINDLEY, CHERYL Other Clinician: Referring Kishon Garriga: Franco NonesLINDLEY, CHERYL Treating Samone Guhl/Extender: Linwood DibblesSTONE III, HOYT Weeks in Treatment: 0 Primary Learner Assessed: Patient Learning Preferences/Education Level/Primary Language Learning Preference: Demonstration Highest Education Level: High School Preferred Language: English Cognitive Barrier Assessment/Beliefs Language Barrier: No Translator Needed: No Memory Deficit: No Emotional Barrier: No Cultural/Religious Beliefs Affecting Medical Care: No Physical Barrier Assessment Impaired Vision: No Impaired Hearing: No Decreased Hand dexterity: No Knowledge/Comprehension Assessment Knowledge Level: Medium Comprehension Level: Medium Ability to understand written Medium instructions: Ability to understand verbal Medium instructions: Motivation Assessment Anxiety Level: Calm Cooperation: Cooperative Education Importance: Acknowledges Need Interest in Health Problems: Asks Questions Perception: Coherent Willingness to Engage in Self- Medium Management Activities: Readiness to Engage in Self- Medium Management Activities: Electronic Signature(s) Signed: 01/02/2018 5:19:43 PM By: Elliot GurneyWoody, BSN, RN, CWS, Kim RN, BSN Entered By: Elliot GurneyWoody, BSN, RN, CWS, Kim on 01/02/2018 08:19:23 [name], [name]  ([id number]) -------------------------------------------------------------------------------- Fall Risk Assessment Details Patient Name: [name], [name] Date of Service: 01/02/2018 8:00 AM Medical Record Number: [id number] Patient Account Number: [id number] Date of Birth/Sex: 1955-11-20 (61 y.o. M) Treating RN: Huel CoventryWoody, Kim Primary Care Marijo Quizon: Franco NonesLINDLEY, CHERYL Other Clinician: Referring Shavonte Zhao: Franco NonesLINDLEY, CHERYL Treating Kawehi Hostetter/Extender: Linwood DibblesSTONE III, HOYT Weeks in Treatment: 0 Fall Risk Assessment Items Have you had 2 or more falls in the last 12 monthso 0 No Have you had any fall that resulted in injury in the last 12 monthso 0 No FALL RISK ASSESSMENT: History of falling - immediate or within 3 months 25 Yes  Secondary diagnosis 0 No Ambulatory aid None/bed rest/wheelchair/nurse 0 Yes Crutches/cane/walker 0 No Furniture 0 No IV Access/Saline Lock 0 No Gait/Training Normal/bed rest/immobile 0 Yes Weak 0 No Impaired 0 No Mental Status Oriented to own ability 0 Yes Electronic Signature(s) Signed: 01/02/2018 5:19:43 PM By: Elliot Gurney, BSN, RN, CWS, Kim RN, BSN Entered By: Elliot Gurney, BSN, RN, CWS, Kim on 01/02/2018 08:19:46 [name] ([id number]) -------------------------------------------------------------------------------- Foot Assessment Details Patient Name: [name] Date of Service: 01/02/2018 8:00 AM Medical Record Number: [id number] Patient Account Number: [id number] Date of Birth/Sex: 03/16/1956 (61 y.o. M) Treating RN: Huel Coventry Primary Care Judiann Celia: Franco Nones Other Clinician: Referring Charlisha Market: Franco Nones Treating Zayley Arras/Extender: STONE III, HOYT Weeks in Treatment: 0 Foot Assessment Items Site Locations + = Sensation present, - = Sensation absent, C = Callus, U = Ulcer R = Redness, W = Warmth, M = Maceration, PU = Pre-ulcerative lesion F = Fissure, S = Swelling, D = Dryness Assessment Right: Left: Other Deformity: No No Prior Foot Ulcer: No  No Prior Amputation: No No Charcot Joint: No No Ambulatory Status: Non-ambulatory Assistance Device: Wheelchair Gait: Notes Patient has BKA on right. Electronic Signature(s) Signed: 01/02/2018 5:19:43 PM By: Elliot Gurney, BSN, RN, CWS, Kim RN, BSN Entered By: Elliot Gurney, BSN, RN, CWS, Kim on 01/02/2018 08:20:32 [name] ([id number]) -------------------------------------------------------------------------------- Nutrition Risk Assessment Details Patient Name: [name] Date of Service: 01/02/2018 8:00 AM Medical Record Number: [id number] Patient Account Number: [id number] Date of Birth/Sex: 1956-04-04 (61 y.o. M) Treating RN: Huel Coventry Primary Care Deannie Resetar: Franco Nones Other Clinician: Referring Odyn Turko: Franco Nones Treating Joanathan Affeldt/Extender: STONE III, HOYT Weeks in Treatment: 0 Height (in): 70 Weight (lbs): 155 Body Mass Index (BMI): 22.2 Nutrition Risk Assessment Items NUTRITION RISK SCREEN: I have an illness or condition that made me change the kind and/or amount of 0 No food I eat I eat fewer than two meals per day 0 No I eat few fruits and vegetables, or milk products 0 No I have three or more drinks of beer, liquor or wine almost every day 0 No I have tooth or mouth problems that make it hard for me to eat 0 No I don't always have enough money to buy the food I need 0 No I eat alone most of the time 0 No I take three or more different prescribed or over-the-counter drugs a day 0 No Without wanting to, I have lost or gained 10 pounds in the last six months 2 Yes I am not always physically able to shop, cook and/or feed myself 0 No Nutrition Protocols Good Risk Protocol Provide education on elevated blood sugars and Moderate Risk Protocol 0 impact on wound healing, as applicable Electronic Signature(s) Signed: 01/02/2018 5:19:43 PM By: Elliot Gurney, BSN, RN, CWS, Kim RN, BSN Entered By: Elliot Gurney, BSN, RN, CWS, Kim on 01/02/2018 08:20:03

## 2018-01-04 NOTE — Progress Notes (Signed)
AIDENJAMES, HECKMANN ([id number]) Visit Report for 01/02/2018 Chief Complaint Document Details Patient Name: [name], [name] Date of Service: 01/02/2018 8:00 AM Medical Record Number: [id number] Patient Account Number: [id number] Date of Birth/Sex: 10-26-1955 (62 y.o. M) Treating RN: Phillis Haggis Primary Care Provider: Franco Nones Other Clinician: Referring Provider: Franco Nones Treating Provider/Extender: Linwood Dibbles, Latamara Melder Weeks in Treatment: 0 Information Obtained from: Patient Chief Complaint Bilateral Gluteal Ulcers Electronic Signature(s) Signed: 01/03/2018 12:21:38 AM By: Lenda Kelp PA-C Entered By: Lenda Kelp on 01/02/2018 08:24:53 [name] ([id number]) -------------------------------------------------------------------------------- HPI Details Patient Name: [name] Date of Service: 01/02/2018 8:00 AM Medical Record Number: [id number] Patient Account Number: [id number] Date of Birth/Sex: April 05, 1956 (62 y.o. M) Treating RN: Phillis Haggis Primary Care Provider: Franco Nones Other Clinician: Referring Provider: Franco Nones Treating Provider/Extender: Linwood Dibbles, Rondi Ivy Weeks in Treatment: 0 History of Present Illness HPI Description: 09/14/17-he is here for evaluation of the left lower extremity injury. He is a resident of a group home and is accompanied by facility staff. He went to his primary care on 2/12 for a left lower extremity wound. At that appointment a wound culture was taken. A referral for wound care services was ordered on 2/26 and according to facility med rec he was started on Bactrim on 3/5. The patient and facility staff are very poor historians. It is assumed that the injury to the left leg was from scratching. He presents today with no open area, no erythema, no evidence of drainage. He is going to PCP later today for follow up. Readmission: 01/02/18 on evaluation today patient presents for reevaluation in the clinic although this is  for a separate issue. He actually has several states to the pressure ulcers along his bilateral gluteal region which apparently have been present for several weeks now. He did see his primary care provider two weeks ago and they did initiate treatment. Home health was finally obtained although they were having a difficult time getting home health. It sounds like doing is actually used for the wound beds obviously don't think this is gonna be the best treatment option for him at this point. Fortunately there does not appear to be any evidence of infection at this time the patient does have discomfort but not as much as he apparently had in the past. When he was originally dealing with these before going to see his primary care provider the pain was much more significant. He does have a foam cushion for his wheelchair although I think being that he is wheelchair dependent he may benefit from a Roho cushion at this point. He also may benefit from an air mattress in my pinion he does have a hospital bed but again between the bed and his wheelchair he pretty much spends all of his time during the day and one of these two locations. I do think he staying too long in the wheelchair as far as sitting up as well which also I think needs to be addressed I did have a long conversation with him today concerning the fact that he needs to rotate positions and locations at least every two hours. This is something we're going to put on the notes as well to sin with him to the facility. I do think that this is going to be of utmost importance the dressings can be helpful but they are not gonna be able to manage this completely alone. He does have some hyper granular tissue noted. Electronic Signature(s) Signed: 01/03/2018 12:21:38 AM By: Linwood Dibbles,  Leonard Schwartz PA-C Entered By: Lenda Kelp on 01/02/2018 09:08:10 [name]  ([id number]) -------------------------------------------------------------------------------- Physical Exam Details Patient Name: [name], [name] Date of Service: 01/02/2018 8:00 AM Medical Record Number: [id number] Patient Account Number: [id number] Date of Birth/Sex: 04-11-1956 (62 y.o. M) Treating RN: Phillis Haggis Primary Care Provider: Franco Nones Other Clinician: Referring Provider: Franco Nones Treating Provider/Extender: STONE III, Wane Mollett Weeks in Treatment: 0 Constitutional patient is hypotensive.. pulse regular and within target range for patient.Marland Kitchen respirations regular, non-labored and within target range for patient.Marland Kitchen temperature within target range for patient.. Well-nourished and well-hydrated in no acute distress. Eyes conjunctiva clear no eyelid edema noted. pupils equal round and reactive to light and accommodation. Ears, Nose, Mouth, and Throat no gross abnormality of ear auricles or external auditory canals. normal hearing noted during conversation. mucus membranes moist. Respiratory normal breathing without difficulty. clear to auscultation bilaterally. Cardiovascular regular rate and rhythm with normal S1, S2. no clubbing, cyanosis, significant edema, <3 sec cap refill patient has a right BKA. Gastrointestinal (GI) soft, non-tender, non-distended, +BS. no ventral hernia noted. Musculoskeletal Patient unable to walk and is wheelchair dependent. Psychiatric this patient is able to make decisions and demonstrates good insight into disease process. Patient is oriented to person and place. pleasant and cooperative. Notes On evaluation today patient's wounds actually appear to be fairly clean although he does have I for granular tissue noted at this point of the wound beds. They do appear to be a little friable but again I do not see anything that appears infected. Overall I'm fairly pleased with the appearance of the wounds other than the hyper granular  status. Electronic Signature(s) Signed: 01/03/2018 12:21:38 AM By: Lenda Kelp PA-C Entered By: Lenda Kelp on 01/02/2018 09:09:43 [name] ([id number]) -------------------------------------------------------------------------------- Physician Orders Details Patient Name: [name] Date of Service: 01/02/2018 8:00 AM Medical Record Number: [id number] Patient Account Number: [id number] Date of Birth/Sex: 13-Jul-1955 (61 y.o. M) Treating RN: Curtis Sites Primary Care Provider: Franco Nones Other Clinician: Referring Provider: Franco Nones Treating Provider/Extender: STONE III, Ivie Maese Weeks in Treatment: 0 Verbal / Phone Orders: No Diagnosis Coding Wound Cleansing Wound #1 Right Gluteus o Cleanse wound with mild soap and water o May Shower, gently pat wound dry prior to applying new dressing. Wound #2 Left Gluteus o Cleanse wound with mild soap and water o May Shower, gently pat wound dry prior to applying new dressing. Wound #3 Right Ischium o Cleanse wound with mild soap and water o May Shower, gently pat wound dry prior to applying new dressing. Wound #4 Left Ischium o Cleanse wound with mild soap and water o May Shower, gently pat wound dry prior to applying new dressing. Wound #5 Right Gluteal fold o Cleanse wound with mild soap and water o May Shower, gently pat wound dry prior to applying new dressing. Anesthetic (add to Medication List) Wound #1 Right Gluteus o Topical Lidocaine 4% cream applied to wound bed prior to debridement (In Clinic Only). Wound #2 Left Gluteus o Topical Lidocaine 4% cream applied to wound bed prior to debridement (In Clinic Only). Wound #3 Right Ischium o Topical Lidocaine 4% cream applied to wound bed prior to debridement (In Clinic Only). Wound #4 Left Ischium o Topical Lidocaine 4% cream applied to wound bed prior to debridement (In Clinic Only). Wound #5 Right Gluteal fold o Topical  Lidocaine 4% cream applied to wound bed prior to debridement (In Clinic Only). Primary Wound Dressing Wound #1 Right Gluteus o Hydrafera Blue Ready Transfer Wound #2  Left Gluteus o Hydrafera Blue Ready Transfer Wound #3 Right Ischium [name], [name] ([id number]) o Hydrafera Blue Ready Transfer Wound #4 Left Ischium o Hydrafera Blue Ready Transfer Wound #5 Right Gluteal fold o Hydrafera Blue Ready Transfer Secondary Dressing Wound #1 Right Gluteus o Boardered Foam Dressing Wound #2 Left Gluteus o Boardered Foam Dressing Wound #3 Right Ischium o Boardered Foam Dressing Wound #4 Left Ischium o Boardered Foam Dressing Wound #5 Right Gluteal fold o Boardered Foam Dressing Dressing Change Frequency Wound #1 Right Gluteus o Change Dressing Monday, Wednesday, Friday - and as needed Wound #2 Left Gluteus o Change Dressing Monday, Wednesday, Friday - and as needed Wound #3 Right Ischium o Change Dressing Monday, Wednesday, Friday - and as needed Wound #4 Left Ischium o Change Dressing Monday, Wednesday, Friday - and as needed Wound #5 Right Gluteal fold o Change Dressing Monday, Wednesday, Friday - and as needed Follow-up Appointments Wound #1 Right Gluteus o Return Appointment in 1 week. Wound #2 Left Gluteus o Return Appointment in 1 week. Wound #3 Right Ischium o Return Appointment in 1 week. Wound #4 Left Ischium o Return Appointment in 1 week. Wound #5 Right Gluteal fold o Return Appointment in 1 week. CARNEY, SAXTON ([id number]) Off-Loading Wound #1 Right Gluteus o Roho cushion for wheelchair - ordered by [facility] o Mattress - ordered by [facility] Wound #2 Left Gluteus o Roho cushion for wheelchair - ordered by [facility] o Mattress - ordered by [facility] Wound #3 Right Ischium o Roho cushion for wheelchair - ordered by [facility] o  Mattress - ordered by [facility] Wound #4 Left Ischium o Roho cushion for wheelchair - ordered by [facility] o Mattress - ordered by [facility] Wound #5 Right Gluteal fold o Roho cushion for wheelchair - ordered by [facility] o Mattress - ordered by [facility] Additional Orders / Instructions Wound #1 Right Gluteus o Increase protein intake. o Activity as tolerated Wound #2 Left Gluteus o Increase protein intake. o Activity as tolerated Wound #3 Right Ischium o Increase protein intake. o Activity as tolerated Wound #4 Left Ischium o Increase protein intake. o Activity as tolerated Wound #5 Right Gluteal fold o Increase protein intake. o Activity as tolerated Home Health Wound #1 Right Gluteus o Continue Home Health Visits - [facility] o Home Health Nurse may visit PRN to address patientos wound care needs. o FACE TO FACE ENCOUNTER: MEDICARE and MEDICAID PATIENTS: I certify that this patient is under my care and that I had a face-to-face encounter that meets the physician face-to-face encounter requirements with this patient on this date. The encounter with the patient was in whole or in part for the following MEDICAL CONDITION: (primary reason for Home Healthcare) MEDICAL NECESSITY: I certify, that based on my findings, NURSING services are a medically necessary home health service. HOME BOUND STATUS: I certify that my clinical findings support that this patient is homebound (i.e., Due to illness or injury, pt requires aid of supportive devices such as crutches, cane, wheelchairs, walkers, the use of special transportation or the assistance of another person to leave their place of residence. There is a normal inability to leave the home and doing so requires considerable and taxing effort. Other absences are for medical reasons / religious services and are infrequent or of  short duration when for other reasons). [name], [name] ([id number]) o If  current dressing causes regression in wound condition, may D/C ordered dressing product/s and apply Normal Saline Moist Dressing daily until next Wound Healing Center / Other MD appointment. Notify Wound Healing Center of regression in wound condition at [phone]. o Please direct any NON-WOUND related issues/requests for orders to patient's Primary Care Physician Wound #2 Left Gluteus o Continue Home [facility] Visits - [facility] o Home Health Nurse may visit PRN to address patientos wound care needs. o FACE TO FACE ENCOUNTER: MEDICARE and MEDICAID PATIENTS: I certify that this patient is under my care and that I had a face-to-face encounter that meets the physician face-to-face encounter requirements with this patient on this date. The encounter with the patient was in whole or in part for the following MEDICAL CONDITION: (primary reason for Home Healthcare) MEDICAL NECESSITY: I certify, that based on my findings, NURSING services are a medically necessary home health service. HOME BOUND STATUS: I certify that my clinical findings support that this patient is homebound (i.e., Due to illness or injury, pt requires aid of supportive devices such as crutches, cane, wheelchairs, walkers, the use of special transportation or the assistance of another person to leave their place of residence. There is a normal inability to leave the home and doing so requires considerable and taxing effort. Other absences are for medical reasons / religious services and are infrequent or of short duration when for other reasons). o If current dressing causes regression in wound condition, may D/C ordered dressing product/s and apply Normal Saline Moist Dressing daily until next Wound Healing Center / Other MD appointment. Notify Wound Healing Center of regression in wound condition at [phone]. o Please direct any NON-WOUND  related issues/requests for orders to patient's Primary Care Physician Wound #3 Right Ischium o Continue Home Health Visits - [facility] o Home Health Nurse may visit PRN to address patientos wound care needs. o FACE TO FACE ENCOUNTER: MEDICARE and MEDICAID PATIENTS: I certify that this patient is under my care and that I had a face-to-face encounter that meets the physician face-to-face encounter requirements with this patient on this date. The encounter with the patient was in whole or in part for the following MEDICAL CONDITION: (primary reason for Home Healthcare) MEDICAL NECESSITY: I certify, that based on my findings, NURSING services are a medically necessary home health service. HOME BOUND STATUS: I certify that my clinical findings support that this patient is homebound (i.e., Due to illness or injury, pt requires aid of supportive devices such as crutches, cane, wheelchairs, walkers, the use of special transportation or the assistance of another person to leave their place of residence. There is a normal inability to leave the home and doing so requires considerable and taxing effort. Other absences are for medical reasons / religious services and are infrequent or of short duration when for other reasons). o If current dressing causes regression in wound condition, may D/C ordered dressing product/s and apply Normal Saline Moist Dressing daily until next Wound Healing Center / Other MD appointment. Notify Wound Healing Center of regression in wound condition at [phone]. o Please direct any NON-WOUND related issues/requests for orders to patient's Primary Care Physician Wound #4 Left Ischium o Continue Home Health Visits - [facility] o Home Health Nurse may visit PRN to address patientos wound care needs. o FACE TO FACE ENCOUNTER: MEDICARE and MEDICAID PATIENTS: I certify that this patient is under my care and that I had a face-to-face encounter that meets the  physician face-to-face encounter requirements with this patient  on this date. The encounter with the patient was in whole or in part for the following MEDICAL CONDITION: (primary reason for Home Healthcare) MEDICAL NECESSITY: I certify, that based on my findings, NURSING services are a medically necessary home health service. HOME BOUND STATUS: I certify that my clinical findings support that this patient is homebound (i.e., Due to illness or injury, pt requires aid of supportive devices such as crutches, cane, wheelchairs, walkers, the use of special transportation or the assistance of another person to leave their place of residence. There is a normal inability to leave the home and doing so requires considerable and taxing effort. Other absences are for medical reasons / religious services and are infrequent or of short duration when for other reasons). o If current dressing causes regression in wound condition, may D/C ordered dressing product/s and apply Normal Saline Moist Dressing daily until next Wound Healing Center / Other MD appointment. Notify Wound Healing Center of regression in wound condition at [phone]. o Please direct any NON-WOUND related issues/requests for orders to patient's Primary Care Physician DAVIEN, MALONE ([id number]) Wound #5 Right Gluteal fold o Continue Home Health Visits - [facility] o Home Health Nurse may visit PRN to address patientos wound care needs. o FACE TO FACE ENCOUNTER: MEDICARE and MEDICAID PATIENTS: I certify that this patient is under my care and that I had a face-to-face encounter that meets the physician face-to-face encounter requirements with this patient on this date. The encounter with the patient was in whole or in part for the following MEDICAL CONDITION: (primary reason for Home Healthcare) MEDICAL NECESSITY: I certify, that based on my findings, NURSING services are a medically necessary home health service. HOME BOUND  STATUS: I certify that my clinical findings support that this patient is homebound (i.e., Due to illness or injury, pt requires aid of supportive devices such as crutches, cane, wheelchairs, walkers, the use of special transportation or the assistance of another person to leave their place of residence. There is a normal inability to leave the home and doing so requires considerable and taxing effort. Other absences are for medical reasons / religious services and are infrequent or of short duration when for other reasons). o If current dressing causes regression in wound condition, may D/C ordered dressing product/s and apply Normal Saline Moist Dressing daily until next Wound Healing Center / Other MD appointment. Notify Wound Healing Center of regression in wound condition at [phone]. o Please direct any NON-WOUND related issues/requests for orders to patient's Primary Care Physician Electronic Signature(s) Signed: 01/02/2018 5:42:13 PM By: Curtis Sites Signed: 01/03/2018 12:21:38 AM By: Lenda Kelp PA-C Entered By: Curtis Sites on 01/02/2018 08:56:11 [name] ([id number]) -------------------------------------------------------------------------------- Problem List Details Patient Name: [name] Date of Service: 01/02/2018 8:00 AM Medical Record Number: [id number] Patient Account Number: [id number] Date of Birth/Sex: [birth year] (61 y.o. 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 01/02/2018 No Yes F17.210 Nicotine dependence, cigarettes, uncomplicated 01/02/2018 No Yes K70.30 Alcoholic cirrhosis of liver without ascites 01/02/2018 No Yes Inactive Problems Resolved Problems Electronic Signature(s) Signed: 01/03/2018 12:21:38 AM By:  Linwood Dibbles, Adrinne Sze PA-C Entered By: Lenda Kelp on 01/02/2018 08:26:11 [name] ([id number]) -------------------------------------------------------------------------------- Progress Note Details Patient Name: [name], [name] Date of Service: 01/02/2018 8:00 AM Medical Record Number: [id number] Patient Account Number: [id number] Date of Birth/Sex: Dec 26, 1955 (61 y.o. M) Treating RN: Phillis Haggis Primary Care Provider: Franco Nones Other Clinician: Referring Provider: Franco Nones Treating Provider/Extender: Linwood Dibbles, Chika Cichowski Weeks in Treatment: 0 Subjective Chief Complaint Information obtained from Patient Bilateral Gluteal Ulcers History of Present Illness (HPI) 09/14/17-he is here for evaluation of the left lower extremity injury. He is a resident of a group home and is accompanied by facility staff. He went to his primary care on 2/12 for a left lower extremity wound. At that appointment a wound culture was taken. A referral for wound care services was ordered on 2/26 and according to facility med rec he was started on Bactrim on 3/5. The patient and facility staff are very poor historians. It is assumed that the injury to the left leg was from scratching. He presents today with no open area, no erythema, no evidence of drainage. He is going to PCP later today for follow up. Readmission: 01/02/18 on evaluation today patient presents for reevaluation in the clinic although this is for a separate issue. He actually has several states to the pressure ulcers along his bilateral gluteal region which apparently have been present for several weeks now. He did see his primary care provider two weeks ago and they  did initiate treatment. Home health was finally obtained although they were having a difficult time getting home health. It sounds like doing is actually used for the wound beds obviously don't think this is gonna be the best treatment option for him at this point. Fortunately there does not appear to be any evidence of infection at this time the patient does have discomfort but not as much as he apparently had in the past. When he was originally dealing with these before going to see his primary care provider the pain was much more significant. He does have a foam cushion for his wheelchair although I think being that he is wheelchair dependent he may benefit from a Roho cushion at this point. He also may benefit from an air mattress in my pinion he does have a hospital bed but again between the bed and his wheelchair he pretty much spends all of his time during the day and one of these two locations. I do think he staying too long in the wheelchair as far as sitting up as well which also I think needs to be addressed I did have a long conversation with him today concerning the fact that he needs to rotate positions and locations at least every two hours. This is something we're going to put on the notes as well to sin with him to the facility. I do think that this is going to be of utmost importance the dressings can be helpful but they are not gonna be able to manage this completely alone. He does have some hyper granular tissue noted. Wound History Patient reportedly has not tested positive for osteomyelitis. Patient reportedly has not had testing performed to evaluate circulation in the legs. Patient History Information obtained from Patient. Allergies Cephalosporins, penicillin, cefazolin Family History Heart Disease - Siblings, Hypertension - Siblings, Lung Disease - Siblings, No family history of Cancer, Diabetes, Hereditary Spherocytosis, Kidney Disease, Seizures, Stroke, Thyroid  Problems, Tuberculosis. BRONDON, WANN ([id number]) Social History Current every day smoker, Marital Status - Single,  Alcohol Use - Never - history of ETOH abuse, Drug Use - Prior History, Caffeine Use - Daily. Medical History Gastrointestinal Patient has history of Hepatitis C Endocrine Patient has history of Type II Diabetes Genitourinary Denies history of End Stage Renal Disease Neurologic Denies history of Dementia, Quadriplegia, Paraplegia, Seizure Disorder Patient is treated with Insulin. Blood sugar is tested. Medical And Surgical History Notes Gastrointestinal Colostomy Musculoskeletal R AKA Review of Systems (ROS) Eyes The patient has no complaints or symptoms. Ear/Nose/Mouth/Throat The patient has no complaints or symptoms. Hematologic/Lymphatic The patient has no complaints or symptoms. Respiratory The patient has no complaints or symptoms. Cardiovascular The patient has no complaints or symptoms. Gastrointestinal The patient has no complaints or symptoms. Endocrine The patient has no complaints or symptoms. Genitourinary The patient has no complaints or symptoms. Immunological The patient has no complaints or symptoms. Neurologic The patient has no complaints or symptoms. Oncologic The patient has no complaints or symptoms. Psychiatric The patient has no complaints or symptoms. Objective Constitutional [name], [name] ([id number]) patient is hypotensive.. pulse regular and within target range for patient.Marland Kitchen respirations regular, non-labored and within target range for patient.Marland Kitchen temperature within target range for patient.. Well-nourished and well-hydrated in no acute distress. Vitals Time Taken: 8:11 AM, Height: 70 in, Source: Stated, Weight: 155 lbs, Source: Measured, BMI: 22.2, Temperature: 97.9 F, Pulse: 85 bpm, Respiratory Rate: 16 breaths/min, Blood Pressure: 89/59 mmHg. Eyes conjunctiva clear no eyelid edema noted. pupils equal round and  reactive to light and accommodation. Ears, Nose, Mouth, and Throat no gross abnormality of ear auricles or external auditory canals. normal hearing noted during conversation. mucus membranes moist. Respiratory normal breathing without difficulty. clear to auscultation bilaterally. Cardiovascular regular rate and rhythm with normal S1, S2. no clubbing, cyanosis, significant edema, Gastrointestinal (GI) soft, non-tender, non-distended, +BS. no ventral hernia noted. Musculoskeletal Patient unable to walk and is wheelchair dependent. Psychiatric this patient is able to make decisions and demonstrates good insight into disease process. Patient is oriented to person and place. pleasant and cooperative. General Notes: On evaluation today patient's wounds actually appear to be fairly clean although he does have I for granular tissue noted at this point of the wound beds. They do appear to be a little friable but again I do not see anything that appears infected. Overall I'm fairly pleased with the appearance of the wounds other than the hyper granular status. Integumentary (Hair, Skin) Wound #1 status is Open. Original cause of wound was Pressure Injury. The wound is located on the Right Gluteus. The wound measures 2.8cm length x 1.5cm width x 0.1cm depth; 3.299cm^2 area and 0.33cm^3 volume. There is Fat Layer (Subcutaneous Tissue) Exposed exposed. There is no tunneling or undermining noted. There is a large amount of serous drainage noted. The wound margin is flat and intact. There is medium (34-66%) red granulation within the wound bed. There is a medium (34-66%) amount of necrotic tissue within the wound bed including Adherent Slough. Wound #2 status is Open. Original cause of wound was Pressure Injury. The wound is located on the Left Gluteus. The wound measures 4cm length x 1.5cm width x 0.1cm depth; 4.712cm^2 area and 0.471cm^3 volume. There is Fat Layer (Subcutaneous Tissue) Exposed exposed.  There is no tunneling or undermining noted. There is a large amount of serous drainage noted. The wound margin is flat and intact. There is medium (34-66%) granulation within the wound bed. There is a medium (34-66%) amount of necrotic tissue within the wound bed including Adherent Slough. The  periwound skin appearance did not exhibit: Callus, Crepitus, Excoriation, Induration, Rash, Scarring, Dry/Scaly, Maceration, Atrophie Blanche, Cyanosis, Ecchymosis, Hemosiderin Staining, Mottled, Pallor, Rubor, Erythema. Wound #3 status is Open. Original cause of wound was Pressure Injury. The wound is located on the Right Ischium. The wound measures 3.2cm length x 3cm width x 0.1cm depth; 7.54cm^2 area and 0.754cm^3 volume. There is Fat Layer (Subcutaneous Tissue) Exposed exposed. There is no tunneling or undermining noted. There is a medium amount of serous drainage noted. The wound margin is flat and intact. There is large (67-100%) red granulation within the wound bed. There is a small (1-33%) amount of necrotic tissue within the wound bed including Adherent Slough. The periwound skin appearance did not exhibit: Callus, Crepitus, Excoriation, Induration, Rash, Scarring, Dry/Scaly, Maceration, Atrophie Blanche, Cyanosis, Ecchymosis, Hemosiderin Staining, Mottled, Pallor, Rubor, Erythema. Periwound temperature was noted as No Abnormality. The periwound has tenderness on palpation. Wound #4 status is Open. Original cause of wound was Pressure Injury. The wound is located on the Left Ischium. The wound measures 2.5cm length x 2.5cm width x 0.1cm depth; 4.909cm^2 area and 0.491cm^3 volume. There is no tunneling [name], [name] ([id number]) or undermining noted. There is a large amount of serous drainage noted. The wound margin is flat and intact. There is small (1-33%) red granulation within the wound bed. There is a large (67-100%) amount of necrotic tissue within the wound bed including Adherent Slough. The  periwound skin appearance did not exhibit: Callus, Crepitus, Excoriation, Induration, Rash, Scarring, Dry/Scaly, Maceration, Atrophie Blanche, Cyanosis, Ecchymosis, Hemosiderin Staining, Mottled, Pallor, Rubor, Erythema. Wound #5 status is Open. Original cause of wound was Pressure Injury. The wound is located on the Right Gluteal fold. The wound measures 4cm length x 4cm width x 0.1cm depth; 12.566cm^2 area and 1.257cm^3 volume. There is Fat Layer (Subcutaneous Tissue) Exposed exposed. There is a large amount of serous drainage noted. The wound margin is flat and intact. There is medium (34-66%) red granulation within the wound bed. There is a medium (34-66%) amount of necrotic tissue within the wound bed including Adherent Slough. The periwound skin appearance did not exhibit: Callus, Crepitus, Excoriation, Induration, Rash, Scarring, Dry/Scaly, Maceration, Atrophie Blanche, Cyanosis, Ecchymosis, Hemosiderin Staining, Mottled, Pallor, Rubor, Erythema. 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 Wound #2 Left Gluteus: Cleanse wound with mild soap and water May Shower, gently pat wound dry prior to applying new dressing. Wound #3 Right Ischium: Cleanse wound with mild soap and water May Shower, gently pat wound dry prior to applying new dressing. Wound #4 Left Ischium: Cleanse wound with mild soap and water May Shower, gently pat wound dry prior to applying new dressing. Wound #5 Right Gluteal fold: Cleanse wound with mild soap and water May Shower, gently pat wound dry prior to applying new  dressing. Anesthetic (add to Medication List): Wound #1 Right Gluteus: Topical Lidocaine 4% cream applied to wound bed prior to debridement (In Clinic Only). Wound #2 Left Gluteus: Topical Lidocaine 4% cream applied to wound bed prior to debridement (In Clinic Only). [name], [name] ([id number]) Wound #3 Right Ischium: Topical Lidocaine 4% cream applied to wound bed prior to debridement (In Clinic Only). Wound #4  Left Ischium: Topical Lidocaine 4% cream applied to wound bed prior to debridement (In Clinic Only). Wound #5 Right Gluteal fold: Topical Lidocaine 4% cream applied to wound bed prior to debridement (In Clinic Only). Primary Wound Dressing: Wound #1 Right Gluteus: Hydrafera Blue Ready Transfer Wound #2 Left Gluteus: Hydrafera Blue Ready Transfer Wound #3 Right Ischium: Hydrafera Blue Ready Transfer Wound #4 Left Ischium: Hydrafera Blue Ready Transfer Wound #5 Right Gluteal fold: Hydrafera Blue Ready Transfer Secondary Dressing: Wound #1 Right Gluteus: Boardered Foam Dressing Wound #2 Left Gluteus: Boardered Foam Dressing Wound #3 Right Ischium: Boardered Foam Dressing Wound #4 Left Ischium: Boardered Foam Dressing Wound #5 Right Gluteal fold: Boardered Foam Dressing Dressing Change Frequency: Wound #1 Right Gluteus: Change Dressing Monday, Wednesday, Friday - and as needed Wound #2 Left Gluteus: Change Dressing Monday, Wednesday, Friday - and as needed Wound #3 Right Ischium: Change Dressing Monday, Wednesday, Friday - and as needed Wound #4 Left Ischium: Change Dressing Monday, Wednesday, Friday - and as needed Wound #5 Right Gluteal fold: Change Dressing Monday, Wednesday, Friday - and as needed Follow-up Appointments: Wound #1 Right Gluteus: Return Appointment in 1 week. Wound #2 Left Gluteus: Return Appointment in 1 week. Wound #3 Right Ischium: Return Appointment in 1 week. Wound #4 Left Ischium: Return Appointment in 1 week. Wound #5 Right Gluteal  fold: Return Appointment in 1 week. Off-Loading: Wound #1 Right Gluteus: Roho cushion for wheelchair - ordered by [facility] - ordered by [facility] Wound #2 Left Gluteus: Roho cushion for wheelchair - ordered by [facility] - ordered by [facility] Wound #3 Right Ischium: Roho cushion for wheelchair - ordered by [facility] - ordered by [facility] [name], [name] ([id number]) Wound #4 Left Ischium: Roho cushion for wheelchair - ordered by [facility] - ordered by [facility] Wound #5 Right Gluteal fold: Roho cushion for wheelchair - ordered by [facility] - ordered by [facility] Additional Orders / Instructions: Wound #1 Right Gluteus: Increase protein intake. Activity as tolerated Wound #2 Left Gluteus: Increase protein intake. Activity as tolerated Wound #3 Right Ischium: Increase protein intake. Activity as tolerated Wound #4 Left Ischium: Increase protein intake. Activity as tolerated Wound #5 Right Gluteal fold: Increase protein intake. Activity as tolerated Home Health: Wound #1 Right Gluteus: Continue Home Health Visits - [facility] Nurse may visit PRN to address patient s wound care needs. FACE TO FACE ENCOUNTER: MEDICARE and MEDICAID PATIENTS: I certify that this patient is under my care and that I had a face-to-face encounter that meets the physician face-to-face encounter requirements with this patient on this date. The encounter with the patient was in whole or in part for the following MEDICAL CONDITION: (primary reason for Home Healthcare) MEDICAL NECESSITY: I certify, that based on my findings, NURSING services are a medically necessary home health service. HOME BOUND STATUS: I certify that my clinical findings support that this patient is homebound (i.e., Due  to illness or injury, pt requires aid of supportive devices such as crutches, cane, wheelchairs, walkers, the use of special transportation or the assistance of another person to leave their place of residence. There is a normal inability to leave the home and doing so requires considerable and taxing effort. Other absences are for medical reasons / religious services and are infrequent or of short duration when for other reasons). If  current dressing causes regression in wound condition, may D/C ordered dressing product/s and apply Normal Saline Moist Dressing daily until next Wound Healing Center / Other MD appointment. Notify Wound Healing Center of regression in wound condition at [phone]. Please direct any NON-WOUND related issues/requests for orders to patient's Primary Care Physician Wound #2 Left Gluteus: Continue Home Health Visits - [facility] Nurse may visit PRN to address patient s wound care needs. FACE TO FACE ENCOUNTER: MEDICARE and MEDICAID PATIENTS: I certify that this patient is under my care and that I had a face-to-face encounter that meets the physician face-to-face encounter requirements with this patient on this date. The encounter with the patient was in whole or in part for the following MEDICAL CONDITION: (primary reason for Home Healthcare) MEDICAL NECESSITY: I certify, that based on my findings, NURSING services are a medically necessary home health service. HOME BOUND STATUS: I certify that my clinical findings support that this patient is homebound (i.e., Due to illness or injury, pt requires aid of supportive devices such as crutches, cane, wheelchairs, walkers, the use of special transportation or the assistance of another person to leave their place of residence. There is a normal inability to leave the home and doing so requires considerable and taxing effort. Other absences are for medical reasons / religious services and are infrequent or of  short duration when for other reasons). If current dressing causes regression in wound condition, may D/C ordered dressing product/s and apply Normal Saline Moist Dressing daily until next Wound Healing Center / Other MD appointment. Notify Wound Healing Center of regression in wound condition at [phone]. Please direct any NON-WOUND related issues/requests for orders to patient's Primary Care Physician Wound #3 Right Ischium: Continue Home Health Visits - [facility] Nurse may visit PRN to address patient s wound care needs. FACE TO FACE ENCOUNTER: MEDICARE and MEDICAID PATIENTS: I certify that this patient is under my care and that I had a face-to-face encounter that meets the physician face-to-face encounter requirements with this patient on this date. The [name], [name] ([id number]) encounter with the patient was in whole or in part for the following MEDICAL CONDITION: (primary reason for Home Healthcare) MEDICAL NECESSITY: I certify, that based on my findings, NURSING services are a medically necessary home health service. HOME BOUND STATUS: I certify that my clinical findings support that this patient is homebound (i.e., Due to illness or injury, pt requires aid of supportive devices such as crutches, cane, wheelchairs, walkers, the use of special transportation or the assistance of another person to leave their place of residence. There is a normal inability to leave the home and doing so requires considerable and taxing effort. Other absences are for medical reasons / religious services and are infrequent or of short duration when for other reasons). If current dressing causes regression in wound condition, may D/C ordered dressing product/s and apply Normal Saline Moist Dressing daily until next Wound Healing Center / Other MD appointment. Notify Wound Healing Center of regression in wound condition at [phone]. Please direct any NON-WOUND related issues/requests for  orders to patient's Primary Care Physician Wound #4 Left Ischium: Continue Home Health Visits - [facility] Nurse may visit PRN to address patient s wound care needs. FACE TO FACE ENCOUNTER: MEDICARE and MEDICAID PATIENTS: I certify that this patient is under my care and that I had a face-to-face encounter that meets the physician face-to-face encounter requirements with this patient on this date. The encounter with the patient  was in whole or in part for the following MEDICAL CONDITION: (primary reason for Home Healthcare) MEDICAL NECESSITY: I certify, that based on my findings, NURSING services are a medically necessary home health service. HOME BOUND STATUS: I certify that my clinical findings support that this patient is homebound (i.e., Due to illness or injury, pt requires aid of supportive devices such as crutches, cane, wheelchairs, walkers, the use of special transportation or the assistance of another person to leave their place of residence. There is a normal inability to leave the home and doing so requires considerable and taxing effort. Other absences are for medical reasons / religious services and are infrequent or of short duration when for other reasons). If current dressing causes regression in wound condition, may D/C ordered dressing product/s and apply Normal Saline Moist Dressing daily until next Wound Healing Center / Other MD appointment. Notify Wound Healing Center of regression in wound condition at [phone]. Please direct any NON-WOUND related issues/requests for orders to patient's Primary Care Physician Wound #5 Right Gluteal fold: Continue Home Health Visits - [facility] Nurse may visit PRN to address patient s wound care needs. FACE TO FACE ENCOUNTER: MEDICARE and MEDICAID PATIENTS: I certify that this patient is under my care and that I had a face-to-face encounter that meets the physician face-to-face encounter requirements with this  patient on this date. The encounter with the patient was in whole or in part for the following MEDICAL CONDITION: (primary reason for Home Healthcare) MEDICAL NECESSITY: I certify, that based on my findings, NURSING services are a medically necessary home health service. HOME BOUND STATUS: I certify that my clinical findings support that this patient is homebound (i.e., Due to illness or injury, pt requires aid of supportive devices such as crutches, cane, wheelchairs, walkers, the use of special transportation or the assistance of another person to leave their place of residence. There is a normal inability to leave the home and doing so requires considerable and taxing effort. Other absences are for medical reasons / religious services and are infrequent or of short duration when for other reasons). If current dressing causes regression in wound condition, may D/C ordered dressing product/s and apply Normal Saline Moist Dressing daily until next Wound Healing Center / Other MD appointment. Notify Wound Healing Center of regression in wound condition at [phone]. Please direct any NON-WOUND related issues/requests for orders to patient's Primary Care Physician At this point I'm gonna suggest that we see about getting him an air mattress along with the Roho cushion at this point try and aid with pressure relief. Patients in agreement with this plan. Subsequently I'm also going to initiate treatment with the [facility] Dressing per above we will see how things go in that regard as well. He is in agreement with this plan. Please see above for specific wound care orders. We will see patient for re-evaluation in 1 week(s) here in the clinic. If anything worsens or changes patient will contact our office for additional recommendations. Electronic Signature(s) Signed: 01/03/2018 12:21:38 AM By: Lenda Kelp PA-C Entered By: Lenda Kelp on 01/02/2018 09:11:42 [name], [name]  ([id number]) [name], [name] ([id number]) -------------------------------------------------------------------------------- ROS/PFSH Details Patient Name: [name] Date of Service: 01/02/2018 8:00 AM Medical Record Number: [id number] Patient Account Number: [id number] Date of Birth/Sex: 09-Mar-1956 (61 y.o. M) Treating RN: Huel Coventry Primary Care Provider: Franco Nones Other Clinician: Referring Provider: Franco Nones Treating Provider/Extender: STONE III, Jamey Harman Weeks in Treatment: 0 Information Obtained From Patient Wound History  Do you currently have one or more open woundso Yes Approximately how long have you had your woundso 2 weeks How have you been treating your wound(s) until nowo Deuoderm Has your wound(s) ever healed and then re-openedo No Have you had any lab work done in the past montho No Have you tested positive for an antibiotic resistant organism (MRSA, VRE)o No Have you tested positive for osteomyelitis (bone infection)o No Have you had any tests for circulation on your legso No Eyes Complaints and Symptoms: No Complaints or Symptoms Medical History: Negative for: Cataracts; Glaucoma; Optic Neuritis Ear/Nose/Mouth/Throat Complaints and Symptoms: No Complaints or Symptoms Medical History: Positive for: Chronic sinus problems/congestion Negative for: Middle ear problems Hematologic/Lymphatic Complaints and Symptoms: No Complaints or Symptoms Medical History: Positive for: Anemia Negative for: Hemophilia; Human Immunodeficiency Virus; Lymphedema; Sickle Cell Disease Respiratory Complaints and Symptoms: No Complaints or Symptoms Medical History: Negative for: Aspiration; Asthma; Chronic Obstructive Pulmonary Disease (COPD); Pneumothorax; Sleep Apnea; Tuberculosis Cardiovascular Porto, Rei ([id number]) Complaints and Symptoms: No Complaints or Symptoms Medical History: Positive for: Arrhythmia Negative for: Angina; Congestive Heart Failure;  Coronary Artery Disease; Deep Vein Thrombosis; Hypertension; Hypotension; Myocardial Infarction; Peripheral Arterial Disease; Peripheral Venous Disease; Phlebitis; Vasculitis Gastrointestinal Complaints and Symptoms: No Complaints or Symptoms Medical History: Positive for: Hepatitis C Negative for: Cirrhosis ; Colitis; Crohnos; Hepatitis A; Hepatitis B Past Medical History Notes: Colostomy Endocrine Complaints and Symptoms: No Complaints or Symptoms Medical History: Positive for: Type II Diabetes Time with diabetes: 1 year Treated with: Insulin Blood sugar tested every day: Yes Tested : Genitourinary Complaints and Symptoms: No Complaints or Symptoms Medical History: Negative for: End Stage Renal Disease Immunological Complaints and Symptoms: No Complaints or Symptoms Medical History: Negative for: Lupus Erythematosus; Raynaudos; Scleroderma Musculoskeletal Medical History: Past Medical History Notes: R AKA Neurologic Complaints and Symptoms: No Complaints or Symptoms Medical History: LABRANDON, KNOCH ([id number]) Negative for: Dementia; Neuropathy; Quadriplegia; Paraplegia; Seizure Disorder Oncologic Complaints and Symptoms: No Complaints or Symptoms Psychiatric Complaints and Symptoms: No Complaints or Symptoms HBO Extended History Items Ear/Nose/Mouth/Throat: Chronic sinus problems/congestion Immunizations Pneumococcal Vaccine: Received Pneumococcal Vaccination: Yes Immunization Notes: up to date Implantable Devices Family and Social History Cancer: No; Diabetes: No; Heart Disease: Yes - Siblings; Hereditary Spherocytosis: No; Hypertension: Yes - Siblings; Kidney Disease: No; Lung Disease: Yes - Siblings; Seizures: No; Stroke: No; Thyroid Problems: No; Tuberculosis: No; Current every day smoker; Marital Status - Single; Alcohol Use: Never - history of ETOH abuse; Drug Use: Prior History; Caffeine Use: Daily; Financial Concerns: No; Food, Clothing or Shelter  Needs: No; Support System Lacking: No; Transportation Concerns: No; Advanced Directives: No; Patient does not want information on Radio broadcast assistant) Signed: 01/02/2018 5:19:43 PM By: Elliot Gurney, BSN, RN, CWS, Kim RN, BSN Signed: 01/03/2018 12:21:38 AM By: Lenda Kelp PA-C Entered By: Elliot Gurney, BSN, RN, CWS, [name] on 01/02/2018 08:18:33 [name] ([id number]) -------------------------------------------------------------------------------- SuperBill Details Patient Name: [name] Date of Service: 01/02/2018 Medical Record Number: [id number] Patient Account Number: [id number] Date of Birth/Sex: Mar 18, 1956 (61 y.o. M) Treating RN: Phillis Haggis Primary Care Provider: Franco Nones Other Clinician: Referring Provider: Franco Nones Treating Provider/Extender: STONE III, Tamanika Heiney Weeks in Treatment: 0 Diagnosis Coding ICD-10 Codes Code Description L89.313 Pressure ulcer of right buttock, stage 3 L89.323 Pressure ulcer of left buttock, stage 3 Z99.3 Dependence on wheelchair Z93.3 Colostomy status Z89.611 Acquired absence of right leg above knee F17.210 Nicotine dependence, cigarettes, uncomplicated K70.30 Alcoholic cirrhosis of liver without ascites Facility Procedures CPT4 Code: [id number] Description: 53664 - WOUND CARE VISIT-LEV  5 EST PT Modifier: Quantity: 1 Physician Procedures CPT4 Code: [id number] Description: 99214 - WC PHYS LEVEL 4 - EST PT ICD-10 Diagnosis Description L89.313 Pressure ulcer of right buttock, stage 3 L89.323 Pressure ulcer of left buttock, stage 3 Z99.3 Dependence on wheelchair Z93.3 Colostomy status Modifier: Quantity: 1 Electronic Signature(s) Signed: 01/03/2018 12:21:38 AM By: Lenda Kelp PA-C Entered By: Lenda Kelp on 01/02/2018 09:11:59

## 2018-01-04 NOTE — Progress Notes (Signed)
[name], [name] ([id number]) Visit Report for 01/02/2018 Allergy List Details Patient Name: [name], [name] Date of Service: 01/02/2018 8:00 AM Medical Record Number: [id number] Patient Account Number: [id number] Date of Birth/Sex: Nov 09, 1955 (62 y.o. M) Treating RN: Huel Coventry Primary Care Wilson Sample: Franco Nones Other Clinician: Referring Julizza Sassone: Franco Nones Treating Jeanee Fabre/Extender: STONE III, HOYT Weeks in Treatment: 0 Allergies Active Allergies Cephalosporins penicillin cefazolin Allergy Notes Electronic Signature(s) Signed: 01/02/2018 5:19:43 PM By: Elliot Gurney, BSN, RN, CWS, Kim RN, BSN Entered By: Elliot Gurney, BSN, RN, CWS, Kim on 01/02/2018 08:13:36 [name] ([id number]) -------------------------------------------------------------------------------- Arrival Information Details Patient Name: [name] Date of Service: 01/02/2018 8:00 AM Medical Record Number: [id number] Patient Account Number: [id number] Date of Birth/Sex: [birth year] (62 y.o. M) Treating RN: Huel Coventry Primary Care Eustolia Drennen: Franco Nones Other Clinician: Referring Ketan Renz: Franco Nones Treating Tonja Jezewski/Extender: Linwood Dibbles, HOYT Weeks in Treatment: 0 Visit Information Patient Arrived: Wheel Chair Arrival Time: 08:10 Accompanied By: caregiver Transfer Assistance: Manual Patient Identification Verified: Yes Secondary Verification Process Completed: Yes History Since Last Visit Electronic Signature(s) Signed: 01/02/2018 5:19:43 PM By: Elliot Gurney, BSN, RN, CWS, Kim RN, BSN Entered By: Elliot Gurney, BSN, RN, CWS, Kim on 01/02/2018 08:11:38 [name] ([id number]) -------------------------------------------------------------------------------- Clinic Level of Care Assessment Details Patient Name: [name] Date of Service: 01/02/2018 8:00 AM Medical Record Number: [id number] Patient Account Number: [id number] Date of Birth/Sex: December 29, 1955 (62 y.o. M) Treating RN: Curtis Sites Primary Care  Quatisha Zylka: Franco Nones Other Clinician: Referring Mersedes Alber: Franco Nones Treating Illana Nolting/Extender: STONE III, HOYT Weeks in Treatment: 0 Clinic Level of Care Assessment Items TOOL 2 Quantity Score []  - Use when only an EandM is performed on the INITIAL visit 0 ASSESSMENTS - Nursing Assessment / Reassessment X - General Physical Exam (combine w/ comprehensive assessment (listed just below) when 1 20 performed on new pt. evals) X- 1 25 Comprehensive Assessment (HX, ROS, Risk Assessments, Wounds Hx, etc.) ASSESSMENTS - Wound and Skin Assessment / Reassessment []  - Simple Wound Assessment / Reassessment - one wound 0 X- 5 5 Complex Wound Assessment / Reassessment - multiple wounds []  - 0 Dermatologic / Skin Assessment (not related to wound area) ASSESSMENTS - Ostomy and/or Continence Assessment and Care []  - Incontinence Assessment and Management 0 []  - 0 Ostomy Care Assessment and Management (repouching, etc.) PROCESS - Coordination of Care X - Simple Patient / Family Education for ongoing care 1 15 []  - 0 Complex (extensive) Patient / Family Education for ongoing care []  - 0 Staff obtains Chiropractor, Records, Test Results / Process Orders []  - 0 Staff telephones HHA, Nursing Homes / Clarify orders / etc []  - 0 Routine Transfer to another Facility (non-emergent condition) []  - 0 Routine Hospital Admission (non-emergent condition) X- 1 15 New Admissions / Manufacturing engineer / Ordering NPWT, Apligraf, etc. []  - 0 Emergency Hospital Admission (emergent condition) X- 1 10 Simple Discharge Coordination []  - 0 Complex (extensive) Discharge Coordination PROCESS - Special Needs []  - Pediatric / Minor Patient Management 0 []  - 0 Isolation Patient Management Botkins, Shaw ([id number]) []  - 0 Hearing / Language / Visual special needs []  - 0 Assessment of Community assistance (transportation, D/C planning, etc.) []  - 0 Additional assistance / Altered mentation X- 1  15 Support Surface(s) Assessment (bed, cushion, seat, etc.) INTERVENTIONS - Wound Cleansing / Measurement X - Wound Imaging (photographs - any number of wounds) 1 5 []  - 0 Wound Tracing (instead of photographs) []  - 0 Simple Wound Measurement - one wound X- 5 5 Complex Wound Measurement - multiple wounds []  -  0 Simple Wound Cleansing - one wound X- 5 5 Complex Wound Cleansing - multiple wounds INTERVENTIONS - Wound Dressings X - Small Wound Dressing one or multiple wounds 5 10 []  - 0 Medium Wound Dressing one or multiple wounds []  - 0 Large Wound Dressing one or multiple wounds []  - 0 Application of Medications - injection INTERVENTIONS - Miscellaneous []  - External ear exam 0 []  - 0 Specimen Collection (cultures, biopsies, blood, body fluids, etc.) []  - 0 Specimen(s) / Culture(s) sent or taken to Lab for analysis []  - 0 Patient Transfer (multiple staff / Nurse, adult / Similar devices) []  - 0 Simple Staple / Suture removal (25 or less) []  - 0 Complex Staple / Suture removal (26 or more) []  - 0 Hypo / Hyperglycemic Management (close monitor of Blood Glucose) []  - 0 Ankle / Brachial Index (ABI) - do not check if billed separately Has the patient been seen at the hospital within the last three years: Yes Total Score: 230 Level Of Care: New/Established - Level 5 Electronic Signature(s) Signed: 01/02/2018 5:42:13 PM By: Curtis Sites Entered By: Curtis Sites on 01/02/2018 09:06:44 [name] ([id number]) -------------------------------------------------------------------------------- Encounter Discharge Information Details Patient Name: [name] Date of Service: 01/02/2018 8:00 AM Medical Record Number: [id number] Patient Account Number: [id number] Date of Birth/Sex: 08-05-1955 (62 y.o. M) Treating RN: Renne Crigler Primary Care Berkeley Vanaken: Franco Nones Other Clinician: Referring Toivo Bordon: Franco Nones Treating Guage Efferson/Extender: Linwood Dibbles, HOYT Weeks  in Treatment: 0 Encounter Discharge Information Items Discharge Condition: Stable Ambulatory Status: Wheelchair Discharge Destination: Home Transportation: Private Auto Accompanied By: caregiver Schedule Follow-up Appointment: Yes Clinical Summary of Care: Electronic Signature(s) Signed: 01/02/2018 4:47:28 PM By: Renne Crigler Entered By: Renne Crigler on 01/02/2018 09:07:39 [name] ([id number]) -------------------------------------------------------------------------------- Lower Extremity Assessment Details Patient Name: [name] Date of Service: 01/02/2018 8:00 AM Medical Record Number: [id number] Patient Account Number: [id number] Date of Birth/Sex: 08-01-1955 (61 y.o. M) Treating RN: Huel Coventry Primary Care Khalif Stender: Franco Nones Other Clinician: Referring Ahnesti Townsend: Franco Nones Treating Audra Bellard/Extender: Linwood Dibbles, HOYT Weeks in Treatment: 0 Electronic Signature(s) Signed: 01/02/2018 5:19:43 PM By: Elliot Gurney, BSN, RN, CWS, Kim RN, BSN Entered By: Elliot Gurney, BSN, RN, CWS, Kim on 01/02/2018 08:13:11 [name] ([id number]) -------------------------------------------------------------------------------- Multi Wound Chart Details Patient Name: [name] Date of Service: 01/02/2018 8:00 AM Medical Record Number: [id number] Patient Account Number: [id number] Date of Birth/Sex: [birth year] (61 y.o. M) Treating RN: Curtis Sites Primary Care Mihaela Fajardo: Franco Nones Other Clinician: Referring Lemonte Al: Franco Nones Treating Jesyka Slaght/Extender: STONE III, HOYT Weeks in Treatment: 0 Vital Signs Height(in): 70 Pulse(bpm): 85 Weight(lbs): 155 Blood Pressure(mmHg): 89/59 Body Mass Index(BMI): 22 Temperature(F): 97.9 Respiratory Rate 16 (breaths/min): Photos: [1:No Photos] [2:No Photos] [3:No Photos] Wound Location: [1:Right Gluteus] [2:Left Gluteus] [3:Right Ischium] Wounding Event: [1:Pressure Injury] [2:Pressure Injury] [3:Pressure  Injury] Primary Etiology: [1:Pressure Ulcer] [2:Pressure Ulcer] [3:Pressure Ulcer] Comorbid History: [1:Chronic sinus problems/congestion, Anemia, Arrhythmia, Hepatitis C, Type II Diabetes] [2:Chronic sinus problems/congestion, Anemia, Arrhythmia, Hepatitis C, Type II Diabetes] [3:Chronic sinus problems/congestion, Anemia, Arrhythmia,  Hepatitis C, Type II Diabetes] Date Acquired: [1:12/11/2017] [2:12/15/2017] [3:12/15/2017] Weeks of Treatment: [1:0] [2:0] [3:0] Wound Status: [1:Open] [2:Open] [3:Open] Clustered Wound: [1:No] [2:No] [3:No] Measurements L x W x D [1:2.8x1.5x0.1] [2:4x1.5x0.1] [3:3.2x3x0.1] (cm) Area (cm) : [1:3.299] [2:4.712] [3:7.54] Volume (cm) : [1:0.33] [2:0.471] [3:0.754] % Reduction in Area: [1:0.00%] [2:0.00%] [3:0.00%] % Reduction in Volume: [1:0.00%] [2:0.00%] [3:0.00%] Classification: [1:Category/Stage III] [2:Category/Stage III] [3:Category/Stage III] Exudate Amount: [1:Large] [2:Large] [3:Medium] Exudate Type: [1:Serous] [2:Serous] [3:Serous] Exudate Color: [1:amber] [2:amber] [3:amber] Wound Margin: [  1:Flat and Intact] [2:Flat and Intact] [3:Flat and Intact] Granulation Amount: [1:Medium (34-66%)] [2:Medium (34-66%)] [3:Large (67-100%)] Granulation Quality: [1:Red] [2:N/A] [3:Red] Necrotic Amount: [1:Medium (34-66%)] [2:Medium (34-66%)] [3:Small (1-33%)] Exposed Structures: [1:Fat Layer (Subcutaneous Tissue) Exposed: Yes Fascia: No Tendon: No Muscle: No Joint: No Bone: No] [2:Fat Layer (Subcutaneous Tissue) Exposed: Yes Fascia: No Tendon: No Muscle: No Joint: No Bone: No] [3:Fat Layer (Subcutaneous Tissue) Exposed: Yes  Fascia: No Tendon: No Muscle: No Joint: No Bone: No] Epithelialization: [1:Small (1-33%)] [2:Small (1-33%)] [3:None] Periwound Skin Texture: [1:No Abnormalities Noted] [2:Excoriation: No Induration: No Callus: No] [3:Excoriation: No Induration: No Callus: No] Crepitus: No Crepitus: No Rash: No Rash: No Scarring: No Scarring: No Periwound  Skin Moisture: No Abnormalities Noted Maceration: No Maceration: No Dry/Scaly: No Dry/Scaly: No Periwound Skin Color: No Abnormalities Noted Atrophie Blanche: No Atrophie Blanche: No Cyanosis: No Cyanosis: No Ecchymosis: No Ecchymosis: No Erythema: No Erythema: No Hemosiderin Staining: No Hemosiderin Staining: No Mottled: No Mottled: No Pallor: No Pallor: No Rubor: No Rubor: No Temperature: N/A N/A No Abnormality Tenderness on Palpation: No No Yes Wound Preparation: Ulcer Cleansing: Ulcer Cleansing: Ulcer Cleansing: Rinsed/Irrigated with Saline Rinsed/Irrigated with Saline Rinsed/Irrigated with Saline Topical Anesthetic Applied: Topical Anesthetic Applied: Topical Anesthetic Applied: Other: lidocaine 4% Other: lidocaine 4% Other: lidocaine 4% Wound Number: 4 5 N/A Photos: No Photos No Photos N/A Wound Location: Left Ischium Right Gluteal fold N/A Wounding Event: Pressure Injury Pressure Injury N/A Primary Etiology: Pressure Ulcer Pressure Ulcer N/A Comorbid History: Chronic sinus Chronic sinus N/A problems/congestion, Anemia, problems/congestion, Anemia, Arrhythmia, Hepatitis C, Type Arrhythmia, Hepatitis C, Type II Diabetes II Diabetes Date Acquired: 12/15/2017 12/15/2017 N/A Weeks of Treatment: 0 0 N/A Wound Status: Open Open N/A Clustered Wound: Yes No N/A Measurements L x W x D 2.5x2.5x0.1 4x4x0.1 N/A (cm) Area (cm) : 4.909 12.566 N/A Volume (cm) : 0.491 1.257 N/A % Reduction in Area: 0.00% 0.00% N/A % Reduction in Volume: 0.00% 0.00% N/A Classification: Category/Stage III Category/Stage III N/A Exudate Amount: Large Large N/A Exudate Type: Serous Serous N/A Exudate Color: amber amber N/A Wound Margin: Flat and Intact Flat and Intact N/A Granulation Amount: Small (1-33%) Medium (34-66%) N/A Granulation Quality: Red Red N/A Necrotic Amount: Large (67-100%) Medium (34-66%) N/A Exposed Structures: Fascia: No Fat Layer (Subcutaneous N/A Fat Layer  (Subcutaneous Tissue) Exposed: Yes Tissue) Exposed: No Fascia: No Tendon: No Tendon: No Muscle: No Muscle: No Joint: No Joint: No Bone: No Bone: No Epithelialization: None N/A N/A Periwound Skin Texture: N/A Calma, Kevontay ([id number]) Excoriation: No Excoriation: No Induration: No Induration: No Callus: No Callus: No Crepitus: No Crepitus: No Rash: No Rash: No Scarring: No Scarring: No Periwound Skin Moisture: Maceration: No Maceration: No N/A Dry/Scaly: No Dry/Scaly: No Periwound Skin Color: Atrophie Blanche: No Atrophie Blanche: No N/A Cyanosis: No Cyanosis: No Ecchymosis: No Ecchymosis: No Erythema: No Erythema: No Hemosiderin Staining: No Hemosiderin Staining: No Mottled: No Mottled: No Pallor: No Pallor: No Rubor: No Rubor: No Temperature: N/A N/A N/A Tenderness on Palpation: No No N/A Wound Preparation: Ulcer Cleansing: Ulcer Cleansing: N/A Rinsed/Irrigated with Saline Rinsed/Irrigated with Saline Topical Anesthetic Applied: Topical Anesthetic Applied: Other: lidocaine 4% Other: lidocaine 4% Treatment Notes Electronic Signature(s) Signed: 01/02/2018 9:06:02 AM By: Curtis Sites Entered By: Curtis Sites on 01/02/2018 09:06:02 [name] ([id number]) -------------------------------------------------------------------------------- Multi-Disciplinary Care Plan Details Patient Name: [name] Date of Service: 01/02/2018 8:00 AM Medical Record Number: [id number] Patient Account Number: [id number] Date of Birth/Sex: 29-Mar-1956 (61 y.o. M) Treating RN: Curtis Sites Primary Care Teagan Heidrick: Franco Nones  Other Clinician: Referring Jan Walters: Franco NonesLINDLEY, CHERYL Treating Quincey Nored/Extender: STONE III, HOYT Weeks in Treatment: 0 Active Inactive ` Abuse / Safety / Falls / Self Care Management Nursing Diagnoses: Impaired physical mobility Goals: Patient will not develop complications from immobility Date Initiated: 01/02/2018 Target  Resolution Date: 02/16/2018 Goal Status: Active Interventions: Assess fall risk on admission and as needed Notes: ` Nutrition Nursing Diagnoses: Potential for alteratiion in Nutrition/Potential for imbalanced nutrition Goals: Patient/caregiver agrees to and verbalizes understanding of need to use nutritional supplements and/or vitamins as prescribed Date Initiated: 01/02/2018 Target Resolution Date: 03/17/2018 Goal Status: Active Interventions: Provide education on nutrition Notes: ` Orientation to the Wound Care Program Nursing Diagnoses: Knowledge deficit related to the wound healing center program Goals: Patient/caregiver will verbalize understanding of the Wound Healing Center Program Date Initiated: 01/02/2018 Target Resolution Date: 02/16/2018 Goal Status: Active Interventions: Shawn SchlichterRANDLES, Boden ([id number]) Provide education on orientation to the wound center Notes: ` Pressure Nursing Diagnoses: Potential for impaired tissue integrity related to pressure, friction, moisture, and shear Goals: Patient will remain free of pressure ulcers Date Initiated: 01/02/2018 Target Resolution Date: 03/17/2018 Goal Status: Active Interventions: Assess potential for pressure ulcer upon admission and as needed Notes: ` Wound/Skin Impairment Nursing Diagnoses: Impaired tissue integrity Goals: Ulcer/skin breakdown will heal within 14 weeks Date Initiated: 01/02/2018 Target Resolution Date: 03/16/2018 Goal Status: Active Interventions: Assess patient/caregiver ability to obtain necessary supplies Assess patient/caregiver ability to perform ulcer/skin care regimen upon admission and as needed Assess ulceration(s) every visit Notes: Electronic Signature(s) Signed: 01/02/2018 9:05:51 AM By: Curtis Sitesorthy, Joanna Entered By: Curtis Sitesorthy, Joanna on 01/02/2018 09:05:50 Shawn SchlichterANDLES, Junah ([id number]) -------------------------------------------------------------------------------- Pain Assessment  Details Patient Name: Shawn SchlichterANDLES, Sanders Date of Service: 01/02/2018 8:00 AM Medical Record Number: [id number] Patient Account Number: [id number] Date of Birth/Sex: Oct 05, 1955 (61 y.o. M) Treating RN: Huel CoventryWoody, Kim Primary Care Barbie Croston: Franco NonesLINDLEY, CHERYL Other Clinician: Referring Fronnie Urton: Franco NonesLINDLEY, CHERYL Treating Lurie Mullane/Extender: Linwood DibblesSTONE III, HOYT Weeks in Treatment: 0 Active Problems Location of Pain Severity and Description of Pain Patient Has Paino No Site Locations Pain Management and Medication Current Pain Management: Electronic Signature(s) Signed: 01/02/2018 5:19:43 PM By: Elliot GurneyWoody, BSN, RN, CWS, Kim RN, BSN Entered By: Elliot GurneyWoody, BSN, RN, CWS, Kim on 01/02/2018 08:11:54 Shawn SchlichterANDLES, Yussuf ([id number]) -------------------------------------------------------------------------------- Patient/Caregiver Education Details Patient Name: Shawn SchlichterANDLES, Ewing Date of Service: 01/02/2018 8:00 AM Medical Record Number: [id number] Patient Account Number: [id number] Date of Birth/Gender: Oct 05, 1955 (61 y.o. M) Treating RN: Renne CriglerFlinchum, Cheryl Primary Care Physician: Franco NonesLINDLEY, CHERYL Other Clinician: Referring Physician: Franco NonesLINDLEY, CHERYL Treating Physician/Extender: Linwood DibblesSTONE III, HOYT Weeks in Treatment: 0 Education Assessment Education Provided To: Patient Education Topics Provided Wound Debridement: Handouts: Wound Debridement Methods: Explain/Verbal Responses: State content correctly Wound/Skin Impairment: Handouts: Caring for Your Ulcer Methods: Explain/Verbal Responses: State content correctly Electronic Signature(s) Signed: 01/02/2018 4:47:28 PM By: Renne CriglerFlinchum, Cheryl Entered By: Renne CriglerFlinchum, Cheryl on 01/02/2018 09:07:57 Shawn SchlichterANDLES, Marzell ([id number]) -------------------------------------------------------------------------------- Wound Assessment Details Patient Name: Shawn SchlichterANDLES, Treson Date of Service: 01/02/2018 8:00 AM Medical Record Number: [id number] Patient Account Number: [id number] Date of Birth/Sex:  Oct 05, 1955 (61 y.o. M) Treating RN: Huel CoventryWoody, Kim Primary Care Chandra Feger: Franco NonesLINDLEY, CHERYL Other Clinician: Referring Enid Maultsby: Franco NonesLINDLEY, CHERYL Treating Skarlette Lattner/Extender: STONE III, HOYT Weeks in Treatment: 0 Wound Status Wound Number: 1 Primary Pressure Ulcer Etiology: Wound Location: Right Gluteus Wound Open Wounding Event: Pressure Injury Status: Date Acquired: 12/11/2017 Comorbid Chronic sinus problems/congestion, Anemia, Weeks Of Treatment: 0 History: Arrhythmia, Hepatitis C, Type II Diabetes Clustered Wound: No Photos Photo Uploaded By: Elliot GurneyWoody, BSN, RN, CWS, Kim on 01/02/2018 09:26:40 Wound Measurements Length: (cm) 2.8 Width: (cm) 1.5  Depth: (cm) 0.1 Area: (cm) 3.299 Volume: (cm) 0.33 % Reduction in Area: 0% % Reduction in Volume: 0% Epithelialization: Small (1-33%) Tunneling: No Undermining: No Wound Description Classification: Category/Stage III Foul Odor Wound Margin: Flat and Intact Slough/Fi Exudate Amount: Large Exudate Type: Serous Exudate Color: amber After Cleansing: No brino Yes Wound Bed Granulation Amount: Medium (34-66%) Exposed Structure Granulation Quality: Red Fascia Exposed: No Necrotic Amount: Medium (34-66%) Fat Layer (Subcutaneous Tissue) Exposed: Yes Necrotic Quality: Adherent Slough Tendon Exposed: No Muscle Exposed: No Joint Exposed: No Bone Exposed: No Periwound Skin Texture Gluth, Granvel ([id number]) Texture Color No Abnormalities Noted: No No Abnormalities Noted: No Moisture No Abnormalities Noted: No Wound Preparation Ulcer Cleansing: Rinsed/Irrigated with Saline Topical Anesthetic Applied: Other: lidocaine 4%, Treatment Notes Wound #1 (Right Gluteus) 1. Cleansed with: Clean wound with Normal Saline 2. Anesthetic Topical Lidocaine 4% cream to wound bed prior to debridement 3. Peri-wound Care: Skin Prep 4. Dressing Applied: Hydrafera Blue 5. Secondary Dressing Applied Bordered Foam Dressing Electronic  Signature(s) Signed: 01/02/2018 5:19:43 PM By: Elliot Gurney, BSN, RN, CWS, Kim RN, BSN Signed: 01/02/2018 5:42:13 PM By: Curtis Sites Previous Signature: 01/02/2018 8:36:48 AM Version By: Elliot Gurney BSN, RN, CWS, Kim RN, BSN Entered By: Curtis Sites on 01/02/2018 08:51:56 [name] ([id number]) -------------------------------------------------------------------------------- Wound Assessment Details Patient Name: [name] Date of Service: 01/02/2018 8:00 AM Medical Record Number: [id number] Patient Account Number: [id number] Date of Birth/Sex: [birth year] (61 y.o. M) Treating RN: Huel Coventry Primary Care Michelina Mexicano: Franco Nones Other Clinician: Referring Naleyah Ohlinger: Franco Nones Treating Meilani Edmundson/Extender: STONE III, HOYT Weeks in Treatment: 0 Wound Status Wound Number: 2 Primary Pressure Ulcer Etiology: Wound Location: Left Gluteus Wound Open Wounding Event: Pressure Injury Status: Date Acquired: 12/15/2017 Comorbid Chronic sinus problems/congestion, Anemia, Weeks Of Treatment: 0 History: Arrhythmia, Hepatitis C, Type II Diabetes Clustered Wound: No Photos Photo Uploaded By: Elliot Gurney, BSN, RN, CWS, Kim on 01/02/2018 09:26:41 Wound Measurements Length: (cm) 4 Width: (cm) 1.5 Depth: (cm) 0.1 Area: (cm) 4.712 Volume: (cm) 0.471 % Reduction in Area: 0% % Reduction in Volume: 0% Epithelialization: Small (1-33%) Tunneling: No Undermining: No Wound Description Classification: Category/Stage III Foul Odor Wound Margin: Flat and Intact Slough/Fi Exudate Amount: Large Exudate Type: Serous Exudate Color: amber After Cleansing: No brino Yes Wound Bed Granulation Amount: Medium (34-66%) Exposed Structure Necrotic Amount: Medium (34-66%) Fascia Exposed: No Necrotic Quality: Adherent Slough Fat Layer (Subcutaneous Tissue) Exposed: Yes Tendon Exposed: No Muscle Exposed: No Joint Exposed: No Bone Exposed: No Periwound Skin Texture Degante, Everrett ([id number]) Texture  Color No Abnormalities Noted: No No Abnormalities Noted: No Callus: No Atrophie Blanche: No Crepitus: No Cyanosis: No Excoriation: No Ecchymosis: No Induration: No Erythema: No Rash: No Hemosiderin Staining: No Scarring: No Mottled: No Pallor: No Moisture Rubor: No No Abnormalities Noted: No Dry / Scaly: No Maceration: No Wound Preparation Ulcer Cleansing: Rinsed/Irrigated with Saline Topical Anesthetic Applied: Other: lidocaine 4%, Treatment Notes Wound #2 (Left Gluteus) 1. Cleansed with: Clean wound with Normal Saline 2. Anesthetic Topical Lidocaine 4% cream to wound bed prior to debridement 3. Peri-wound Care: Skin Prep 4. Dressing Applied: Hydrafera Blue 5. Secondary Dressing Applied Bordered Foam Dressing Electronic Signature(s) Signed: 01/02/2018 5:19:43 PM By: Elliot Gurney, BSN, RN, CWS, Kim RN, BSN Signed: 01/02/2018 5:42:13 PM By: Curtis Sites Previous Signature: 01/02/2018 8:41:47 AM Version By: Elliot Gurney, BSN, RN, CWS, Kim RN, BSN Entered By: Curtis Sites on 01/02/2018 08:52:09 [name] ([id number]) -------------------------------------------------------------------------------- Wound Assessment Details Patient Name: [name] Date of Service: 01/02/2018 8:00 AM Medical Record Number: [id number] Patient Account  Number: [id number] Date of Birth/Sex: Aug 15, 1955 (61 y.o. M) Treating RN: Huel Coventry Primary Care Sadat Sliwa: Franco Nones Other Clinician: Referring Taige Housman: Franco Nones Treating Lejon Afzal/Extender: STONE III, HOYT Weeks in Treatment: 0 Wound Status Wound Number: 3 Primary Pressure Ulcer Etiology: Wound Location: Right Ischium Wound Open Wounding Event: Pressure Injury Status: Date Acquired: 12/15/2017 Comorbid Chronic sinus problems/congestion, Anemia, Weeks Of Treatment: 0 History: Arrhythmia, Hepatitis C, Type II Diabetes Clustered Wound: No Photos Photo Uploaded By: Elliot Gurney, BSN, RN, CWS, Kim on 01/02/2018 09:27:15 Wound  Measurements Length: (cm) 3.2 Width: (cm) 3 Depth: (cm) 0.1 Area: (cm) 7.54 Volume: (cm) 0.754 % Reduction in Area: 0% % Reduction in Volume: 0% Epithelialization: None Tunneling: No Undermining: No Wound Description Classification: Category/Stage III Foul Odor Wound Margin: Flat and Intact Slough/Fi Exudate Amount: Medium Exudate Type: Serous Exudate Color: amber After Cleansing: No brino Yes Wound Bed Granulation Amount: Large (67-100%) Exposed Structure Granulation Quality: Red Fascia Exposed: No Necrotic Amount: Small (1-33%) Fat Layer (Subcutaneous Tissue) Exposed: Yes Necrotic Quality: Adherent Slough Tendon Exposed: No Muscle Exposed: No Joint Exposed: No Bone Exposed: No Periwound Skin Texture Burrill, Abdulrahim ([id number]) Texture Color No Abnormalities Noted: No No Abnormalities Noted: No Callus: No Atrophie Blanche: No Crepitus: No Cyanosis: No Excoriation: No Ecchymosis: No Induration: No Erythema: No Rash: No Hemosiderin Staining: No Scarring: No Mottled: No Pallor: No Moisture Rubor: No No Abnormalities Noted: No Dry / Scaly: No Temperature / Pain Maceration: No Temperature: No Abnormality Tenderness on Palpation: Yes Wound Preparation Ulcer Cleansing: Rinsed/Irrigated with Saline Topical Anesthetic Applied: Other: lidocaine 4%, Treatment Notes Wound #3 (Right Ischium) 1. Cleansed with: Clean wound with Normal Saline 2. Anesthetic Topical Lidocaine 4% cream to wound bed prior to debridement 3. Peri-wound Care: Skin Prep 4. Dressing Applied: Hydrafera Blue 5. Secondary Dressing Applied Bordered Foam Dressing Electronic Signature(s) Signed: 01/02/2018 5:19:43 PM By: Elliot Gurney, BSN, RN, CWS, Kim RN, BSN Signed: 01/02/2018 5:42:13 PM By: Curtis Sites Entered By: Curtis Sites on 01/02/2018 08:51:41 [name] ([id number]) -------------------------------------------------------------------------------- Wound Assessment  Details Patient Name: [name] Date of Service: 01/02/2018 8:00 AM Medical Record Number: [id number] Patient Account Number: [id number] Date of Birth/Sex: 15-Dec-1955 (61 y.o. M) Treating RN: Huel Coventry Primary Care Aydin Hink: Franco Nones Other Clinician: Referring Ahriana Gunkel: Franco Nones Treating Mixtli Reno/Extender: STONE III, HOYT Weeks in Treatment: 0 Wound Status Wound Number: 4 Primary Pressure Ulcer Etiology: Wound Location: Left Ischium Wound Open Wounding Event: Pressure Injury Status: Date Acquired: 12/15/2017 Comorbid Chronic sinus problems/congestion, Anemia, Weeks Of Treatment: 0 History: Arrhythmia, Hepatitis C, Type II Diabetes Clustered Wound: Yes Photos Photo Uploaded By: Elliot Gurney, BSN, RN, CWS, Kim on 01/02/2018 09:27:16 Wound Measurements Length: (cm) 2.5 Width: (cm) 2.5 Depth: (cm) 0.1 Area: (cm) 4.909 Volume: (cm) 0.491 % Reduction in Area: 0% % Reduction in Volume: 0% Epithelialization: None Tunneling: No Undermining: No Wound Description Classification: Category/Stage III Foul Odor Wound Margin: Flat and Intact Slough/Fi Exudate Amount: Large Exudate Type: Serous Exudate Color: amber After Cleansing: No brino Yes Wound Bed Granulation Amount: Small (1-33%) Exposed Structure Granulation Quality: Red Fascia Exposed: No Necrotic Amount: Large (67-100%) Fat Layer (Subcutaneous Tissue) Exposed: No Necrotic Quality: Adherent Slough Tendon Exposed: No Muscle Exposed: No Joint Exposed: No Bone Exposed: No Periwound Skin Texture Kobus, Steaven ([id number]) Texture Color No Abnormalities Noted: No No Abnormalities Noted: No Callus: No Atrophie Blanche: No Crepitus: No Cyanosis: No Excoriation: No Ecchymosis: No Induration: No Erythema: No Rash: No Hemosiderin Staining: No Scarring: No Mottled: No Pallor: No Moisture Rubor: No No Abnormalities Noted:  No Dry / Scaly: No Maceration: No Wound Preparation Ulcer Cleansing:  Rinsed/Irrigated with Saline Topical Anesthetic Applied: Other: lidocaine 4%, Treatment Notes Wound #4 (Left Ischium) 1. Cleansed with: Clean wound with Normal Saline 2. Anesthetic Topical Lidocaine 4% cream to wound bed prior to debridement 3. Peri-wound Care: Skin Prep 4. Dressing Applied: Hydrafera Blue 5. Secondary Dressing Applied Bordered Foam Dressing Electronic Signature(s) Signed: 01/02/2018 5:19:43 PM By: Elliot Gurney, BSN, RN, CWS, Kim RN, BSN Signed: 01/02/2018 5:42:13 PM By: Curtis Sites Previous Signature: 01/02/2018 8:43:19 AM Version By: Elliot Gurney, BSN, RN, CWS, Kim RN, BSN Entered By: Curtis Sites on 01/02/2018 08:52:20 JALENE, LACKO ([id number]) -------------------------------------------------------------------------------- Wound Assessment Details Patient Name: [name] Date of Service: 01/02/2018 8:00 AM Medical Record Number: [id number] Patient Account Number: [id number] Date of Birth/Sex: 04-26-1956 (61 y.o. M) Treating RN: Huel Coventry Primary Care Shenia Alan: Franco Nones Other Clinician: Referring Shunte Senseney: Franco Nones Treating Kamden Stanislaw/Extender: STONE III, HOYT Weeks in Treatment: 0 Wound Status Wound Number: 5 Primary Pressure Ulcer Etiology: Wound Location: Right Gluteal fold Wound Open Wounding Event: Pressure Injury Status: Date Acquired: 12/15/2017 Comorbid Chronic sinus problems/congestion, Anemia, Weeks Of Treatment: 0 History: Arrhythmia, Hepatitis C, Type II Diabetes Clustered Wound: No Photos Photo Uploaded By: Elliot Gurney, BSN, RN, CWS, Kim on 01/02/2018 09:27:43 Wound Measurements Length: (cm) 4 Width: (cm) 4 Depth: (cm) 0.1 Area: (cm) 12.566 Volume: (cm) 1.257 % Reduction in Area: 0% % Reduction in Volume: 0% Wound Description Classification: Category/Stage III Foul Odor Wound Margin: Flat and Intact Exudate Amount: Large Exudate Type: Serous Exudate Color: amber After Cleansing: No Wound Bed Granulation Amount: Medium  (34-66%) Exposed Structure Granulation Quality: Red Fascia Exposed: No Necrotic Amount: Medium (34-66%) Fat Layer (Subcutaneous Tissue) Exposed: Yes Necrotic Quality: Adherent Slough Tendon Exposed: No Muscle Exposed: No Joint Exposed: No Bone Exposed: No Periwound Skin Texture Tacker, Brayon ([id number]) Texture Color No Abnormalities Noted: No No Abnormalities Noted: No Callus: No Atrophie Blanche: No Crepitus: No Cyanosis: No Excoriation: No Ecchymosis: No Induration: No Erythema: No Rash: No Hemosiderin Staining: No Scarring: No Mottled: No Pallor: No Moisture Rubor: No No Abnormalities Noted: No Dry / Scaly: No Maceration: No Wound Preparation Ulcer Cleansing: Rinsed/Irrigated with Saline Topical Anesthetic Applied: Other: lidocaine 4%, Treatment Notes Wound #5 (Right Gluteal fold) 1. Cleansed with: Clean wound with Normal Saline 2. Anesthetic Topical Lidocaine 4% cream to wound bed prior to debridement 3. Peri-wound Care: Skin Prep 4. Dressing Applied: Hydrafera Blue 5. Secondary Dressing Applied Bordered Foam Dressing Electronic Signature(s) Signed: 01/02/2018 5:19:43 PM By: Elliot Gurney, BSN, RN, CWS, Kim RN, BSN Signed: 01/02/2018 5:42:13 PM By: Curtis Sites Previous Signature: 01/02/2018 8:41:00 AM Version By: Elliot Gurney, BSN, RN, CWS, Kim RN, BSN Entered By: Curtis Sites on 01/02/2018 08:52:34 MARBIN, OLSHEFSKI ([id number]) -------------------------------------------------------------------------------- Vitals Details Patient Name: [name] Date of Service: 01/02/2018 8:00 AM Medical Record Number: [id number] Patient Account Number: [id number] Date of Birth/Sex: 18-Mar-1956 (61 y.o. M) Treating RN: Huel Coventry Primary Care Marcea Rojek: Franco Nones Other Clinician: Referring Sarayah Bacchi: Franco Nones Treating Jevon Littlepage/Extender: STONE III, HOYT Weeks in Treatment: 0 Vital Signs Time Taken: 08:11 Temperature (F): 97.9 Height (in): 70 Pulse (bpm):  85 Source: Stated Respiratory Rate (breaths/min): 16 Weight (lbs): 155 Blood Pressure (mmHg): 89/59 Source: Measured Reference Range: 80 - 120 mg / dl Body Mass Index (BMI): 22.2 Electronic Signature(s) Signed: 01/02/2018 5:19:43 PM By: Elliot Gurney, BSN, RN, CWS, Kim RN, BSN Entered By: Elliot Gurney, BSN, RN, CWS, Kim on 01/02/2018 08:13:01

## 2018-01-09 ENCOUNTER — Encounter: Payer: Medicaid Other | Attending: Physician Assistant | Admitting: Physician Assistant

## 2018-01-09 DIAGNOSIS — Z993 Dependence on wheelchair: Secondary | ICD-10-CM | POA: Diagnosis not present

## 2018-01-09 DIAGNOSIS — L89323 Pressure ulcer of left buttock, stage 3: Secondary | ICD-10-CM | POA: Diagnosis not present

## 2018-01-09 DIAGNOSIS — F17210 Nicotine dependence, cigarettes, uncomplicated: Secondary | ICD-10-CM | POA: Insufficient documentation

## 2018-01-09 DIAGNOSIS — K7030 Alcoholic cirrhosis of liver without ascites: Secondary | ICD-10-CM | POA: Diagnosis not present

## 2018-01-09 DIAGNOSIS — L89313 Pressure ulcer of right buttock, stage 3: Secondary | ICD-10-CM | POA: Diagnosis not present

## 2018-01-09 DIAGNOSIS — Z933 Colostomy status: Secondary | ICD-10-CM | POA: Insufficient documentation

## 2018-01-09 DIAGNOSIS — Z89611 Acquired absence of right leg above knee: Secondary | ICD-10-CM | POA: Insufficient documentation

## 2018-01-11 NOTE — Progress Notes (Signed)
KIAH, KEAY ([id number]) Visit Report for 01/09/2018 Chief Complaint Document Details Patient Name: [name], [name] Date of Service: 01/09/2018 11:30 AM Medical Record Number: [id number] Patient Account Number: [id number] Date of Birth/Sex: January 30, 1956 (62 y.o. M) Treating RN: Phillis Haggis Primary Care Provider: Franco Nones Other Clinician: Referring Provider: Franco Nones Treating Provider/Extender: Linwood Dibbles, HOYT Weeks in Treatment: 1 Information Obtained from: Patient Chief Complaint Bilateral Gluteal Ulcers Electronic Signature(s) Signed: 01/10/2018 1:22:53 AM By: Lenda Kelp PA-C Entered By: Lenda Kelp on 01/09/2018 11:20:53 [name] ([id number]) -------------------------------------------------------------------------------- HPI Details Patient Name: [name] Date of Service: 01/09/2018 11:30 AM Medical Record Number: [id number] Patient Account Number: [id number] Date of Birth/Sex: April 06, 1956 (62 y.o. M) Treating RN: Phillis Haggis Primary Care Provider: Franco Nones Other Clinician: Referring Provider: Franco Nones Treating Provider/Extender: Linwood Dibbles, HOYT Weeks in Treatment: 1 History of Present Illness HPI Description: 09/14/17-he is here for evaluation of the left lower extremity injury. He is a resident of a group home and is accompanied by facility staff. He went to his primary care on 2/12 for a left lower extremity wound. At that appointment a wound culture was taken. A referral for wound care services was ordered on 2/26 and according to facility med rec he was started on Bactrim on 3/5. The patient and facility staff are very poor historians. It is assumed that the injury to the left leg was from scratching. He presents today with no open area, no erythema, no evidence of drainage. He is going to PCP later today for follow up. Readmission: 01/02/18 on evaluation today patient presents for reevaluation in the clinic although this is  for a separate issue. He actually has several states to the pressure ulcers along his bilateral gluteal region which apparently have been present for several weeks now. He did see his primary care provider two weeks ago and they did initiate treatment. Home health was finally obtained although they were having a difficult time getting home health. It sounds like doing is actually used for the wound beds obviously don't think this is gonna be the best treatment option for him at this point. Fortunately there does not appear to be any evidence of infection at this time the patient does have discomfort but not as much as he apparently had in the past. When he was originally dealing with these before going to see his primary care provider the pain was much more significant. He does have a foam cushion for his wheelchair although I think being that he is wheelchair dependent he may benefit from a Roho cushion at this point. He also may benefit from an air mattress in my pinion he does have a hospital bed but again between the bed and his wheelchair he pretty much spends all of his time during the day and one of these two locations. I do think he staying too long in the wheelchair as far as sitting up as well which also I think needs to be addressed I did have a long conversation with him today concerning the fact that he needs to rotate positions and locations at least every two hours. This is something we're going to put on the notes as well to sin with him to the facility. I do think that this is going to be of utmost importance the dressings can be helpful but they are not gonna be able to manage this completely alone. He does have some hyper granular tissue noted. 01/09/18 on evaluation today patient presents for reevaluation concerning  multiple pressure ulcers noted of the bilateral gluteal region. He has been tolerating the dressing changes without complication the good news is he seems to be making  great progress at this point in time. There does not appear to be any evidence of infection at this time. No fevers, chills, nausea, or vomiting noted at this time. Electronic Signature(s) Signed: 01/10/2018 1:22:53 AM By: Lenda Kelp PA-C Entered By: Lenda Kelp on 01/10/2018 00:22:07 [name] ([id number]) -------------------------------------------------------------------------------- Physical Exam Details Patient Name: [name] Date of Service: 01/09/2018 11:30 AM Medical Record Number: [id number] Patient Account Number: [id number] Date of Birth/Sex: June 14, 1956 (62 y.o. M) Treating RN: Phillis Haggis Primary Care Provider: Franco Nones Other Clinician: Referring Provider: Franco Nones Treating Provider/Extender: STONE III, HOYT Weeks in Treatment: 1 Constitutional Well-nourished and well-hydrated in no acute distress. Respiratory normal breathing without difficulty. clear to auscultation bilaterally. Cardiovascular regular rate and rhythm with normal S1, S2. Psychiatric this patient is able to make decisions and demonstrates good insight into disease process. Alert and Oriented x 3. pleasant and cooperative. Notes At this point patient's wounds did not require any sharp debridement today. Overall I feel like that the dressing that we are utilizing currently is doing a great job and he seems to be making good progress even since my initial evaluation. Electronic Signature(s) Signed: 01/10/2018 1:22:53 AM By: Lenda Kelp PA-C Entered By: Lenda Kelp on 01/10/2018 00:22:54 [name] ([id number]) -------------------------------------------------------------------------------- Physician Orders Details Patient Name: [name] Date of Service: 01/09/2018 11:30 AM Medical Record Number: [id number] Patient Account Number: [id number] Date of Birth/Sex: [birth year] (62 y.o. 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 Wound #2 Left Gluteus o Cleanse wound with mild soap and water o May Shower, gently pat wound dry prior to applying new dressing. Wound #3 Right Ischium o Cleanse wound with mild soap and water o May Shower, gently pat wound dry prior to applying new dressing. Wound #4 Left Ischium o Cleanse wound with mild soap and water o May Shower, gently pat wound dry prior to applying new dressing. Wound #5 Right Gluteal fold o Cleanse wound with mild soap and water o May Shower, gently pat wound dry prior to applying new dressing. Anesthetic (add to Medication List) Wound #1 Right Gluteus o Topical Lidocaine 4% cream applied to wound bed prior to debridement (In Clinic Only). Wound #2 Left Gluteus o Topical Lidocaine 4% cream applied to wound bed prior to debridement (In Clinic Only). Wound #3 Right Ischium o Topical Lidocaine 4% cream applied to wound bed prior to debridement (In Clinic Only). Wound #4 Left Ischium o Topical Lidocaine 4% cream applied to wound bed prior to debridement (In Clinic Only). [name], [name] ([id number]) Wound #5 Right Gluteal fold o Topical Lidocaine 4% cream applied to wound bed prior to debridement (In Clinic Only). Primary Wound  Dressing Wound #1 Right Gluteus o Hydrafera  Blue Ready Transfer Wound #2 Left Gluteus o Hydrafera Blue Ready Transfer Wound #3 Right Ischium o Hydrafera Blue Ready Transfer Wound #4 Left Ischium o Hydrafera Blue Ready Transfer Wound #5 Right Gluteal fold o Hydrafera Blue Ready Transfer Secondary Dressing Wound #1 Right Gluteus o Boardered Foam Dressing Wound #2 Left Gluteus o Boardered Foam Dressing Wound #3 Right Ischium o Boardered Foam Dressing Wound #4 Left Ischium o Boardered Foam Dressing Wound #5 Right Gluteal fold o Boardered Foam Dressing Dressing Change Frequency Wound #1 Right Gluteus o Change Dressing Monday, Wednesday, Friday - and as needed Wound #2 Left Gluteus o Change Dressing Monday, Wednesday, Friday - and as needed Wound #3 Right Ischium o Change Dressing Monday, Wednesday, Friday - and as needed Wound #4 Left Ischium o Change Dressing Monday, Wednesday, Friday - and as needed Wound #5 Right Gluteal fold o Change Dressing Monday, Wednesday, Friday - and as needed Follow-up Appointments Wound #1 Right Gluteus o Return Appointment in 2 weeks. [name], [name] ([id number]) Wound #2 Left Gluteus o Return Appointment in 2 weeks. Wound #3 Right Ischium o Return Appointment in 2 weeks. Wound #4 Left Ischium o Return Appointment in 2 weeks. Wound #5 Right Gluteal fold o Return Appointment in 2 weeks. Off-Loading Wound #1 Right Gluteus o Roho cushion for wheelchair - ordered by [facility] o Mattress - ordered by [facility] o Turn and reposition every 2 hours Wound #2 Left Gluteus o Roho cushion for wheelchair - ordered by [facility] o Mattress - ordered by [facility] o Turn and reposition every 2 hours Wound #3 Right Ischium o Roho cushion for wheelchair - ordered by [facility] o Mattress - ordered by [facility] o Turn and reposition every 2 hours Wound #4 Left Ischium o Roho cushion for wheelchair - ordered by [facility] o Mattress - ordered by [facility] o Turn and reposition every 2 hours Wound #5 Right Gluteal fold o Roho cushion for wheelchair - ordered by [facility] o Mattress - ordered by [facility] o Turn and reposition every 2 hours Additional Orders / Instructions Wound #1 Right Gluteus o Increase protein intake. o Activity as tolerated Wound #2 Left Gluteus o Increase protein intake. o Activity as tolerated Wound #3 Right Ischium o Increase protein intake. o Activity as tolerated Wound #4 Left Ischium o Increase protein intake. o Activity as tolerated [name], [name] ([id number]) Wound #5 Right Gluteal fold o Increase protein intake. o Activity as tolerated Home Health Wound #1 Right Gluteus o Continue Home Health Visits - [facility] o Home Health Nurse may visit PRN to address patientos wound care needs. o FACE TO FACE ENCOUNTER: MEDICARE and MEDICAID PATIENTS: I certify that this patient is under my care and that I had a face-to-face encounter that meets the physician face-to-face encounter requirements with this patient on this date. The encounter with the patient was in whole or in part for the following MEDICAL CONDITION: (primary reason for Home Healthcare) MEDICAL NECESSITY: I certify, that based on my findings, NURSING services are a medically necessary home health service. HOME BOUND STATUS: I certify that my clinical findings support that this patient is homebound (i.e., Due to illness or injury, pt requires aid of supportive devices such as crutches, cane, wheelchairs, walkers, the use of special transportation or the assistance of another person to leave their place of residence. There is a  normal inability to leave the home and doing so requires considerable  and taxing effort. Other absences are for medical reasons / religious services and are infrequent or of short duration when for other reasons). o If current dressing causes regression in wound condition, may D/C ordered dressing product/s and apply Normal Saline Moist Dressing daily until next Wound Healing Center / Other MD appointment. Notify Wound Healing Center of regression in wound condition at [phone]. o Please direct any NON-WOUND related issues/requests for orders to patient's Primary Care Physician Wound #2 Left Gluteus o Continue Home Health Visits - [facility] o Home Health Nurse may visit PRN to address patientos wound care needs. o FACE TO FACE ENCOUNTER: MEDICARE and MEDICAID PATIENTS: I certify that this patient is under my care and that I had a face-to-face encounter that meets the physician face-to-face encounter requirements with this patient on this date. The encounter with the patient was in whole or in part for the following MEDICAL CONDITION: (primary reason for Home Healthcare) MEDICAL NECESSITY: I certify, that based on my findings, NURSING services are a medically necessary home health service. HOME BOUND STATUS: I certify that my clinical findings support that this patient is homebound (i.e., Due to illness or injury, pt requires aid of supportive devices such as crutches, cane, wheelchairs, walkers, the use of special transportation or the assistance of another person to leave their place of residence. There is a normal inability to leave the home and doing so requires considerable and taxing effort. Other absences are for medical reasons / religious services and are infrequent or of short duration when for other reasons). o If current dressing causes regression in wound condition, may D/C ordered dressing product/s and apply Normal Saline Moist Dressing daily until next Wound Healing Center / Other MD appointment. Notify Wound Healing Center of  regression in wound condition at [phone]. o Please direct any NON-WOUND related issues/requests for orders to patient's Primary Care Physician Wound #3 Right Ischium o Continue Home Health Visits - [facility] o Home Health Nurse may visit PRN to address patientos wound care needs. o FACE TO FACE ENCOUNTER: MEDICARE and MEDICAID PATIENTS: I certify that this patient is under my care and that I had a face-to-face encounter that meets the physician face-to-face encounter requirements with this patient on this date. The encounter with the patient was in whole or in part for the following MEDICAL CONDITION: (primary reason for Home Healthcare) MEDICAL NECESSITY: I certify, that based on my findings, NURSING services are a medically necessary home health service. HOME BOUND STATUS: I certify that my clinical findings support that this patient is homebound (i.e., Due to illness or injury, pt requires aid of supportive devices such as crutches, cane, wheelchairs, walkers, the use of special transportation or the assistance of another person to leave their place of residence. There is a normal inability to leave the home and doing so requires considerable and taxing effort. Other absences are for medical reasons / religious services and are infrequent or of short duration when for other reasons). o If current dressing causes regression in wound condition, may D/C ordered dressing product/s and apply Normal Saline Moist Dressing daily until next Wound Healing Center / Other MD appointment. Notify Wound Healing Center of regression in wound condition at [phone]. [name], [name] ([id number]) o Please direct any NON-WOUND related issues/requests for orders to patient's Primary Care Physician Wound #4 Left Ischium o Continue Home Health Visits - [facility] o Home Health Nurse may visit PRN to address  patientos wound care needs. o FACE TO FACE ENCOUNTER: MEDICARE and MEDICAID  PATIENTS: I certify that this patient is under my care and that I had a face-to-face encounter that meets the physician face-to-face encounter requirements with this patient on this date. The encounter with the patient was in whole or in part for the following MEDICAL CONDITION: (primary reason for Home Healthcare) MEDICAL NECESSITY: I certify, that based on my findings, NURSING services are a medically necessary home health service. HOME BOUND STATUS: I certify that my clinical findings support that this patient is homebound (i.e., Due to illness or injury, pt requires aid of supportive devices such as crutches, cane, wheelchairs, walkers, the use of special transportation or the assistance of another person to leave their place of residence. There is a normal inability to leave the home and doing so requires considerable and taxing effort. Other absences are for medical reasons / religious services and are infrequent or of short duration when for other reasons). o If current dressing causes regression in wound condition, may D/C ordered dressing product/s and apply Normal Saline Moist Dressing daily until next Wound Healing Center / Other MD appointment. Notify Wound Healing Center of regression in wound condition at [phone]. o Please direct any NON-WOUND related issues/requests for orders to patient's Primary Care Physician Wound #5 Right Gluteal fold o Continue Home Health Visits - [facility] o Home Health Nurse may visit PRN to address patientos wound care needs. o FACE TO FACE ENCOUNTER: MEDICARE and MEDICAID PATIENTS: I certify that this patient is under my care and that I had a face-to-face encounter that meets the physician face-to-face encounter requirements with this patient on this date. The encounter with the patient was in whole or in part for the following MEDICAL CONDITION: (primary reason for Home Healthcare) MEDICAL NECESSITY: I certify, that based on my  findings, NURSING services are a medically necessary home health service. HOME BOUND STATUS: I certify that my clinical findings support that this patient is homebound (i.e., Due to illness or injury, pt requires aid of supportive devices such as crutches, cane, wheelchairs, walkers, the use of special transportation or the assistance of another person to leave their place of residence. There is a normal inability to leave the home and doing so requires considerable and taxing effort. Other absences are for medical reasons / religious services and are infrequent or of short duration when for other reasons). o If current dressing causes regression in wound condition, may D/C ordered dressing product/s and apply Normal Saline Moist Dressing daily until next Wound Healing Center / Other MD appointment. Notify Wound Healing Center of regression in wound condition at [phone]. o Please direct any NON-WOUND related issues/requests for orders to patient's Primary Care Physician Patient Medications Allergies: Cephalosporins, penicillin, cefazolin Notifications Medication Indication Start End lidocaine DOSE 1 - topical 4 % cream - 1 cream topical Electronic Signature(s) Signed: 01/09/2018 4:10:39 PM By: Alejandro Mulling Signed: 01/10/2018 1:22:53 AM By: Lenda Kelp PA-C Entered By: Alejandro Mulling on 01/09/2018 12:14:16 JERAL, ZICK ([id number]) -------------------------------------------------------------------------------- Prescription 01/09/2018 Patient Name: [name] Provider: Lenda Kelp PA-C Date of Birth: [birth year] NPI#: [id number] Sex: M DEA#: [id number] Phone #: [phone] License #: Patient Address: [facility] [address] [facility] [place], [place] [address] [address], [address] [place], [place]  [address] [phone] Allergies Cephalosporins penicillin cefazolin Medication Medication: Route: Strength: Form: lidocaine 4 % topical cream topical 4% cream Class: TOPICAL LOCAL ANESTHETICS Dose: Frequency /  Time: Indication: 1 1 cream topical Number of Refills: Number of Units: 0 Generic Substitution: Start Date: End Date: One Time Use: Substitution Permitted No Note to Pharmacy: Signature(s): Date(s): Electronic Signature(s) Signed: 01/09/2018 4:10:39 PM By: Alejandro Mulling Signed: 01/10/2018 1:22:53 AM By: Cherlynn Polo, [place] ([id number]) Entered By: Alejandro Mulling on 01/09/2018 12:14:17 [name] ([id number]) --------------------------------------------------------------------------------  Problem List Details Patient Name: [name] Date of Service: 01/09/2018 11:30 AM Medical Record Number: [id number] Patient Account Number: [id number] Date of Birth/Sex: [birth year] (61 y.o. 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 01/02/2018 No Yes F17.210 Nicotine dependence, cigarettes, uncomplicated 01/02/2018 No Yes K70.30 Alcoholic cirrhosis of liver without ascites 01/02/2018 No Yes Inactive Problems Resolved Problems Electronic Signature(s) Signed: 01/10/2018 1:22:53 AM By: Lenda Kelp PA-C Entered By: Lenda Kelp on 01/09/2018 11:20:43 [name]  ([id number]) -------------------------------------------------------------------------------- Progress Note Details Patient Name: [name] Date of Service: 01/09/2018 11:30 AM Medical Record Number: [id number] Patient Account Number: [id number] Date of Birth/Sex: [birth year] (61 y.o. M) Treating RN: Phillis Haggis Primary Care Provider: Franco Nones Other Clinician: Referring Provider: Franco Nones Treating Provider/Extender: Linwood Dibbles, HOYT Weeks in Treatment: 1 Subjective Chief Complaint Information obtained from Patient Bilateral Gluteal Ulcers History of Present Illness (HPI) 09/14/17-he is here for evaluation of the left lower extremity injury. He is a resident of a group home and is accompanied by facility staff. He went to his primary care on 2/12 for a left lower extremity wound. At that appointment a wound culture was taken. A referral for wound care services was ordered on 2/26 and according to facility med rec he was started on Bactrim on 3/5. The patient and facility staff are very poor historians. It is assumed that the injury to the left leg was from scratching. He presents today with no open area, no erythema, no evidence of drainage. He is going to PCP later today for follow up. Readmission: 01/02/18 on evaluation today patient presents for reevaluation in the clinic although this is for a separate issue. He actually has several states to the pressure ulcers along his bilateral gluteal region which apparently have been present for several weeks now. He did see his primary care provider two weeks ago and they did initiate treatment. Home health was finally obtained although they were having a difficult time getting home health. It sounds like doing is actually used for the wound beds obviously don't think this is gonna be the best treatment option for him at this point. Fortunately there does not appear to be any evidence of infection at this time the patient does have  discomfort but not as much as he apparently had in the past. When he was originally dealing with these before going to see his primary care provider the pain was much more significant. He does have a foam cushion for his wheelchair although I think being that he is wheelchair dependent he may benefit from a Roho cushion at this point. He also may benefit from an air mattress in my pinion he does have a hospital bed but again between the  bed and his wheelchair he pretty much spends all of his time during the day and one of these two locations. I do think he staying too long in the wheelchair as far as sitting up as well which also I think needs to be addressed I did have a long conversation with him today concerning the fact that he needs to rotate positions and locations at least every two hours. This is something we're going to put on the notes as well to sin with him to the facility. I do think that this is going to be of utmost importance the dressings can be helpful but they are not gonna be able to manage this completely alone. He does have some hyper granular tissue noted. 01/09/18 on evaluation today patient presents for reevaluation concerning multiple pressure ulcers noted of the bilateral gluteal region. He has been tolerating the dressing changes without complication the good news is he seems to be making great progress at this point in time. There does not appear to be any evidence of infection at this time. No fevers, chills, nausea, or vomiting noted at this time. Patient History Information obtained from Patient. Family History Heart Disease - Siblings, Hypertension - Siblings, Lung Disease - Siblings, No family history of Cancer, Diabetes, Hereditary Spherocytosis, Kidney Disease, Seizures, Stroke, Thyroid Problems, Tuberculosis. Social History Current every day smoker, Marital Status - Single, Alcohol Use - Never - history of ETOH abuse, Drug Use - Prior History, Caffeine Use -  Daily. Shawn SchlichterRANDLES, Kinser ([id number]) Medical And Surgical History Notes Gastrointestinal Colostomy Musculoskeletal R AKA Review of Systems (ROS) Constitutional Symptoms (General Health) Denies complaints or symptoms of Fever, Chills. Respiratory The patient has no complaints or symptoms. Cardiovascular The patient has no complaints or symptoms. Psychiatric The patient has no complaints or symptoms. Objective Constitutional Well-nourished and well-hydrated in no acute distress. Vitals Time Taken: 11:50 AM, Height: 70 in, Weight: 155 lbs, BMI: 22.2, Temperature: 98.1 F, Pulse: 76 bpm, Respiratory Rate: 16 breaths/min, Blood Pressure: 96/62 mmHg. Respiratory normal breathing without difficulty. clear to auscultation bilaterally. Cardiovascular regular rate and rhythm with normal S1, S2. Psychiatric this patient is able to make decisions and demonstrates good insight into disease process. Alert and Oriented x 3. pleasant and cooperative. General Notes: At this point patient's wounds did not require any sharp debridement today. Overall I feel like that the dressing that we are utilizing currently is doing a great job and he seems to be making good progress even since my initial evaluation. Integumentary (Hair, Skin) Wound #1 status is Open. Original cause of wound was Pressure Injury. The wound is located on the Right Gluteus. The wound measures 2.8cm length x 1.9cm width x 0.1cm depth; 4.178cm^2 area and 0.418cm^3 volume. There is Fat Layer (Subcutaneous Tissue) Exposed exposed. There is no tunneling or undermining noted. There is a large amount of serous drainage noted. The wound margin is flat and intact. There is medium (34-66%) red granulation within the wound bed. There is a medium (34-66%) amount of necrotic tissue within the wound bed including Adherent Slough. Wound #2 status is Open. Original cause of wound was Pressure Injury. The wound is located on the Left Gluteus.  The wound measures 3.7cm length x 0.9cm width x 0.1cm depth; 2.615cm^2 area and 0.262cm^3 volume. There is Fat Layer (Subcutaneous Tissue) Exposed exposed. There is no tunneling or undermining noted. There is a large amount of serous drainage noted. The wound margin is flat and intact. There is medium (34-66%) granulation within the  wound bed. There is a Vandruff, Harbor ([id number]) medium (34-66%) amount of necrotic tissue within the wound bed including Adherent Slough. The periwound skin appearance did not exhibit: Callus, Crepitus, Excoriation, Induration, Rash, Scarring, Dry/Scaly, Maceration, Atrophie Blanche, Cyanosis, Ecchymosis, Hemosiderin Staining, Mottled, Pallor, Rubor, Erythema. Wound #3 status is Open. Original cause of wound was Pressure Injury. The wound is located on the Right Ischium. The wound measures 2.9cm length x 4.6cm width x 0.1cm depth; 10.477cm^2 area and 1.048cm^3 volume. There is Fat Layer (Subcutaneous Tissue) Exposed exposed. There is no tunneling or undermining noted. There is a medium amount of serous drainage noted. The wound margin is flat and intact. There is medium (34-66%) red, hyper - granulation within the wound bed. There is a medium (34-66%) amount of necrotic tissue within the wound bed including Adherent Slough. The periwound skin appearance did not exhibit: Callus, Crepitus, Excoriation, Induration, Rash, Scarring, Dry/Scaly, Maceration, Atrophie Blanche, Cyanosis, Ecchymosis, Hemosiderin Staining, Mottled, Pallor, Rubor, Erythema. Periwound temperature was noted as No Abnormality. The periwound has tenderness on palpation. Wound #4 status is Open. Original cause of wound was Pressure Injury. The wound is located on the Left Ischium. The wound measures 0.9cm length x 2.8cm width x 0.1cm depth; 1.979cm^2 area and 0.198cm^3 volume. There is no tunneling or undermining noted. There is a large amount of serous drainage noted. The wound margin is flat and  intact. There is large (67-100%) red granulation within the wound bed. There is a small (1-33%) amount of necrotic tissue within the wound bed including Adherent Slough. The periwound skin appearance did not exhibit: Callus, Crepitus, Excoriation, Induration, Rash, Scarring, Dry/Scaly, Maceration, Atrophie Blanche, Cyanosis, Ecchymosis, Hemosiderin Staining, Mottled, Pallor, Rubor, Erythema. Wound #5 status is Open. Original cause of wound was Pressure Injury. The wound is located on the Right Gluteal fold. The wound measures 4.6cm length x 2cm width x 0.1cm depth; 7.226cm^2 area and 0.723cm^3 volume. There is Fat Layer (Subcutaneous Tissue) Exposed exposed. There is no tunneling or undermining noted. There is a large amount of serous drainage noted. The wound margin is flat and intact. There is medium (34-66%) red granulation within the wound bed. There is a medium (34-66%) amount of necrotic tissue within the wound bed including Adherent Slough. The periwound skin appearance did not exhibit: Callus, Crepitus, Excoriation, Induration, Rash, Scarring, Dry/Scaly, Maceration, Atrophie Blanche, Cyanosis, Ecchymosis, Hemosiderin Staining, Mottled, Pallor, Rubor, Erythema. 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 Wound #2 Left Gluteus: Cleanse wound with mild soap and water [name], [name] ([id number]) May Shower, gently pat wound dry prior to applying new dressing. Wound #3 Right Ischium: Cleanse wound with mild soap and water May Shower, gently pat wound dry prior to applying new dressing. Wound  #4 Left Ischium: Cleanse wound with mild soap and water May Shower, gently pat wound dry prior to applying new dressing. Wound #5 Right Gluteal fold: Cleanse wound with mild soap and water May Shower, gently pat wound dry prior to applying new dressing. Anesthetic (add to Medication List): Wound #1 Right Gluteus: Topical Lidocaine 4% cream applied to wound bed prior to debridement (In Clinic Only). Wound #2 Left Gluteus: Topical Lidocaine 4% cream applied to  wound bed prior to debridement (In Clinic Only). Wound #3 Right Ischium: Topical Lidocaine 4% cream applied to wound bed prior to debridement (In Clinic Only). Wound #4 Left Ischium: Topical Lidocaine 4% cream applied to wound bed prior to debridement (In Clinic Only). Wound #5 Right Gluteal fold: Topical Lidocaine 4% cream applied to wound bed prior to debridement (In Clinic Only). Primary Wound Dressing: Wound #1 Right Gluteus: Hydrafera Blue Ready Transfer Wound #2 Left Gluteus: Hydrafera Blue Ready Transfer Wound #3 Right Ischium: Hydrafera Blue Ready Transfer Wound #4 Left Ischium: Hydrafera Blue Ready Transfer Wound #5 Right Gluteal fold: Hydrafera Blue Ready Transfer Secondary Dressing: Wound #1 Right Gluteus: Boardered Foam Dressing Wound #2 Left Gluteus: Boardered Foam Dressing Wound #3 Right Ischium: Boardered Foam Dressing Wound #4 Left Ischium: Boardered Foam Dressing Wound #5 Right Gluteal fold: Boardered Foam Dressing Dressing Change Frequency: Wound #1 Right Gluteus: Change Dressing Monday, Wednesday, Friday - and as needed Wound #2 Left Gluteus: Change Dressing Monday, Wednesday, Friday - and as needed Wound #3 Right Ischium: Change Dressing Monday, Wednesday, Friday - and as needed Wound #4 Left Ischium: Change Dressing Monday, Wednesday, Friday - and as needed Wound #5 Right Gluteal fold: Change Dressing Monday, Wednesday, Friday - and as needed Follow-up Appointments: Wound #1 Right  Gluteus: Return Appointment in 2 weeks. Wound #2 Left Gluteus: Return Appointment in 2 weeks. Wound #3 Right Ischium: [name], [name] ([id number]) Return Appointment in 2 weeks. Wound #4 Left Ischium: Return Appointment in 2 weeks. Wound #5 Right Gluteal fold: Return Appointment in 2 weeks. Off-Loading: Wound #1 Right Gluteus: Roho cushion for wheelchair - ordered by [facility] [place] - ordered by [facility] Turn and reposition every 2 hours Wound #2 Left Gluteus: Roho cushion for wheelchair - ordered by [facility] [place] - ordered by [facility] Turn and reposition every 2 hours Wound #3 Right Ischium: Roho cushion for wheelchair - ordered by [facility] [place] - ordered by [facility] Turn and reposition every 2 hours Wound #4 Left Ischium: Roho cushion for wheelchair - ordered by [facility] [place] - ordered by [facility] Turn and reposition every 2 hours Wound #5 Right Gluteal fold: Roho cushion for wheelchair - ordered by [facility] [place] - ordered by [facility] Turn and reposition every 2 hours Additional Orders / Instructions: Wound #1 Right Gluteus: Increase protein intake. Activity as tolerated Wound #2 Left Gluteus: Increase protein intake. Activity as tolerated Wound #3 Right Ischium: Increase protein intake. Activity as tolerated Wound #4 Left Ischium: Increase protein intake. Activity as tolerated Wound #5 Right Gluteal fold: Increase protein intake. Activity as tolerated Home Health: Wound #1 Right Gluteus: Continue Home Health Visits - [facility] Nurse may visit PRN to address patient s wound care needs. FACE TO FACE ENCOUNTER: MEDICARE and MEDICAID PATIENTS: I certify that this patient is under my care and that I had a face-to-face encounter that meets the physician face-to-face  encounter requirements with this patient on this date. The encounter with the patient was in whole or in part for the following MEDICAL CONDITION: (primary reason for Home Healthcare) MEDICAL NECESSITY: I certify, that based on my findings, NURSING services are a medically necessary home health service. HOME BOUND STATUS: I certify that my clinical findings support that this patient is homebound (i.e., Due to illness or injury, pt requires aid of supportive devices such as crutches,  cane, wheelchairs, walkers, the use of special transportation or the assistance of another person to leave their place of residence. There is a normal inability to leave the home and doing so requires considerable and taxing effort. Other absences are for medical reasons / religious services and are infrequent or of short duration when for other reasons). If current dressing causes regression in wound condition, may D/C ordered dressing product/s and apply Normal Saline Moist Dressing daily until next Wound Healing Center / Other MD appointment. Notify Wound Healing Center of regression in wound condition at [phone]. Please direct any NON-WOUND related issues/requests for orders to patient's Primary Care Physician Wound #2 Left GluteusESSAM, [name] ([id number]) Continue Home Health Visits - [facility] Nurse may visit PRN to address patient s wound care needs. FACE TO FACE ENCOUNTER: MEDICARE and MEDICAID PATIENTS: I certify that this patient is under my care and that I had a face-to-face encounter that meets the physician face-to-face encounter requirements with this patient on this date. The encounter with the patient was in whole or in part for the following MEDICAL CONDITION: (primary reason for Home Healthcare) MEDICAL NECESSITY: I certify, that based on my findings, NURSING services are a medically necessary home health service. HOME BOUND STATUS: I certify that my clinical findings support  that this patient is homebound (i.e., Due to illness or injury, pt requires aid of supportive devices such as crutches, cane, wheelchairs, walkers, the use of special transportation or the assistance of another person to leave their place of residence. There is a normal inability to leave the home and doing so requires considerable and taxing effort. Other absences are for medical reasons / religious services and are infrequent or of short duration when for other reasons). If current dressing causes regression in wound condition, may D/C ordered dressing product/s and apply Normal Saline Moist Dressing daily until next Wound Healing Center / Other MD appointment. Notify Wound Healing Center of regression in wound condition at [phone]. Please direct any NON-WOUND related issues/requests for orders to patient's Primary Care Physician Wound #3 Right Ischium: Continue Home Health Visits - [facility] Nurse may visit PRN to address patient s wound care needs. FACE TO FACE ENCOUNTER: MEDICARE and MEDICAID PATIENTS: I certify that this patient is under my care and that I had a face-to-face encounter that meets the physician face-to-face encounter requirements with this patient on this date. The encounter with the patient was in whole or in part for the following MEDICAL CONDITION: (primary reason for Home Healthcare) MEDICAL NECESSITY: I certify, that based on my findings, NURSING services are a medically necessary home health service. HOME BOUND STATUS: I certify that my clinical findings support that this patient is homebound (i.e., Due to illness or injury, pt requires aid of supportive devices such as crutches, cane, wheelchairs, walkers, the use of special transportation or the assistance of another person to leave their place of residence. There is a normal inability to leave the home and doing so requires considerable and taxing effort. Other absences are for medical reasons /  religious services and are infrequent or of short duration when for other reasons). If current dressing causes regression in wound condition, may D/C ordered dressing product/s and apply Normal Saline Moist Dressing daily until next Wound Healing Center / Other MD appointment. Notify Wound Healing Center of regression in wound condition at [phone]. Please direct any NON-WOUND related issues/requests for orders to patient's Primary Care Physician Wound #4 Left Ischium: Continue Home Health  Visits - [facility] Nurse may visit PRN to address patient s wound care needs. FACE TO FACE ENCOUNTER: MEDICARE and MEDICAID PATIENTS: I certify that this patient is under my care and that I had a face-to-face encounter that meets the physician face-to-face encounter requirements with this patient on this date. The encounter with the patient was in whole or in part for the following MEDICAL CONDITION: (primary reason for Home Healthcare) MEDICAL NECESSITY: I certify, that based on my findings, NURSING services are a medically necessary home health service. HOME BOUND STATUS: I certify that my clinical findings support that this patient is homebound (i.e., Due to illness or injury, pt requires aid of supportive devices such as crutches, cane, wheelchairs, walkers, the use of special transportation or the assistance of another person to leave their place of residence. There is a normal inability to leave the home and doing so requires considerable and taxing effort. Other absences are for medical reasons / religious services and are infrequent or of short duration when for other reasons). If current dressing causes regression in wound condition, may D/C ordered dressing product/s and apply Normal Saline Moist Dressing daily until next Wound Healing Center / Other MD appointment. Notify Wound Healing Center of regression in wound condition at [phone]. Please direct any NON-WOUND related  issues/requests for orders to patient's Primary Care Physician Wound #5 Right Gluteal fold: Continue Home Health Visits - [facility] Nurse may visit PRN to address patient s wound care needs. FACE TO FACE ENCOUNTER: MEDICARE and MEDICAID PATIENTS: I certify that this patient is under my care and that I had a face-to-face encounter that meets the physician face-to-face encounter requirements with this patient on this date. The encounter with the patient was in whole or in part for the following MEDICAL CONDITION: (primary reason for Home Healthcare) MEDICAL NECESSITY: I certify, that based on my findings, NURSING services are a medically necessary home health service. HOME BOUND STATUS: I certify that my clinical findings support that this patient is homebound (i.e., Due to illness or injury, pt requires aid of supportive devices such as crutches, cane, wheelchairs, walkers, the use of special transportation or the assistance of another person to leave their place of residence. There is a normal inability to leave the home and doing so requires considerable and taxing effort. Other absences are for medical reasons / religious services and are infrequent or of short duration when for other reasons). If current dressing causes regression in wound condition, may D/C ordered dressing product/s and apply Normal Saline [name], [name] ([id number]) Moist Dressing daily until next Wound Healing Center / Other MD appointment. Notify Wound Healing Center of regression in wound condition at [phone]. Please direct any NON-WOUND related issues/requests for orders to patient's Primary Care Physician The following medication(s) was prescribed: lidocaine topical 4 % cream 1 1 cream topical was prescribed at facility I am going to suggest currently that we continue with the Current wound care measures for the next week. Patients in agreement the plan. He would like to actually do a two week  follow-up which I think would be appropriate and okay at this point considering how well things are doing the only thing I would recommend is that if they do not receive the air mattress and cushion for his wheelchair and contact us sooner in order to work on this yet again. Hopefully he will receive this however fairly quickly. It was ordered during last week's visit. Please see above for specific wound  care orders. We will see patient for re-evaluation in 2 week(s) here in the clinic. If anything worsens or changes patient will contact our office for additional recommendations. Electronic Signature(s) Signed: 01/10/2018 1:22:53 AM By: Lenda Kelp PA-C Entered By: Lenda Kelp on 01/10/2018 00:23:38 [name] ([id number]) -------------------------------------------------------------------------------- ROS/PFSH Details Patient Name: [name] Date of Service: 01/09/2018 11:30 AM Medical Record Number: [id number] Patient Account Number: [id number] Date of Birth/Sex: 19-May-1956 (61 y.o. M) Treating RN: Phillis Haggis Primary Care Provider: Franco Nones Other Clinician: Referring Provider: Franco Nones Treating Provider/Extender: STONE III, HOYT Weeks in Treatment: 1 Information Obtained From Patient Wound History Do you currently have one or more open woundso Yes Approximately how long have you had your woundso 2 weeks How have you been treating your wound(s) until nowo Deuoderm Has your wound(s) ever healed and then re-openedo No Have you had any lab work done in the past montho No Have you tested positive for an antibiotic resistant organism (MRSA, VRE)o No Have you tested positive for osteomyelitis (bone infection)o No Have you had any tests for circulation on your legso No Constitutional Symptoms (General Health) Complaints and Symptoms: Negative for: Fever; Chills Eyes Medical History: Negative for: Cataracts; Glaucoma; Optic  Neuritis Ear/Nose/Mouth/Throat Medical History: Positive for: Chronic sinus problems/congestion Negative for: Middle ear problems Hematologic/Lymphatic Medical History: Positive for: Anemia Negative for: Hemophilia; Human Immunodeficiency Virus; Lymphedema; Sickle Cell Disease Respiratory Complaints and Symptoms: No Complaints or Symptoms Medical History: Negative for: Aspiration; Asthma; Chronic Obstructive Pulmonary Disease (COPD); Pneumothorax; Sleep Apnea; Tuberculosis Cardiovascular Complaints and Symptoms: No Complaints or Symptoms Medical History: Positive for: Arrhythmia [name], [name] ([id number]) Negative for: Angina; Congestive Heart Failure; Coronary Artery Disease; Deep Vein Thrombosis; Hypertension; Hypotension; Myocardial Infarction; Peripheral Arterial Disease; Peripheral Venous Disease; Phlebitis; Vasculitis Gastrointestinal Medical History: Positive for: Hepatitis C Negative for: Cirrhosis ; Colitis; Crohnos; Hepatitis A; Hepatitis B Past Medical History Notes: Colostomy Endocrine Medical History: Positive for: Type II Diabetes Time with diabetes: 1 year Treated with: Insulin Blood sugar tested every day: Yes Tested : Genitourinary Medical History: Negative for: End Stage Renal Disease Immunological Medical History: Negative for: Lupus Erythematosus; Raynaudos; Scleroderma Musculoskeletal Medical History: Past Medical History Notes: R AKA Neurologic Medical History: Negative for: Dementia; Neuropathy; Quadriplegia; Paraplegia; Seizure Disorder Psychiatric Complaints and Symptoms: No Complaints or Symptoms HBO Extended History Items Ear/Nose/Mouth/Throat: Chronic sinus problems/congestion Immunizations Pneumococcal Vaccine: Received Pneumococcal Vaccination: Yes Immunization Notes: up to date Implantable Devices Family and Social History [name], [name] ([id number]) Cancer: No; Diabetes: No; Heart Disease: Yes - Siblings; Hereditary  Spherocytosis: No; Hypertension: Yes - Siblings; Kidney Disease: No; Lung Disease: Yes - Siblings; Seizures: No; Stroke: No; Thyroid Problems: No; Tuberculosis: No; Current every day smoker; Marital Status - Single; Alcohol Use: Never - history of ETOH abuse; Drug Use: Prior History; Caffeine Use: Daily; Financial Concerns: No; Food, Clothing or Shelter Needs: No; Support System Lacking: No; Transportation Concerns: No; Advanced Directives: No; Patient does not want information on Advanced Directives Physician Affirmation I have reviewed and agree with the above information. Electronic Signature(s) Signed: 01/10/2018 1:22:53 AM By: Lenda Kelp PA-C Signed: 01/10/2018 3:49:29 PM By: Alejandro Mulling Entered By: Lenda Kelp on 01/10/2018 00:22:31 [name] ([id number]) -------------------------------------------------------------------------------- SuperBill Details Patient Name: [name] Date of Service: 01/09/2018 Medical Record Number: [id number] Patient Account Number: [id number] Date of Birth/Sex: 11-01-1955 (61 y.o. M) Treating RN: Phillis Haggis Primary Care Provider: Franco Nones Other Clinician: Referring Provider: Franco Nones Treating Provider/Extender: STONE III, HOYT Weeks in Treatment: 1 Diagnosis  Coding ICD-10 Codes Code Description L89.313 Pressure ulcer of right buttock, stage 3 L89.323 Pressure ulcer of left buttock, stage 3 Z99.3 Dependence on wheelchair Z93.3 Colostomy status Z89.611 Acquired absence of right leg above knee F17.210 Nicotine dependence, cigarettes, uncomplicated K70.30 Alcoholic cirrhosis of liver without ascites Facility Procedures CPT4 Code: [id number] Description: 84132 - WOUND CARE VISIT-LEV 5 EST PT Modifier: Quantity: 1 Physician Procedures CPT4 Code: [id number] Description: 99213 - WC PHYS LEVEL 3 - EST PT ICD-10 Diagnosis Description L89.313 Pressure ulcer of right buttock, stage 3 Z99.3 Dependence on wheelchair L89.323  Pressure ulcer of left buttock, stage 3 Z93.3 Colostomy status Modifier: Quantity: 1 Electronic Signature(s) Signed: 01/10/2018 1:22:53 AM By: Lenda Kelp PA-C Entered By: Lenda Kelp on 01/10/2018 00:24:06

## 2018-01-11 NOTE — Progress Notes (Signed)
[name], [name] ([id number]) Visit Report for 01/09/2018 Arrival Information Details Patient Name: [name], [name] Date of Service: 01/09/2018 11:30 AM Medical Record Number: [id number] Patient Account Number: [id number] Date of Birth/Sex: 1956/03/05 (61 y.o. M) Treating RN: Curtis Sites Primary Care Jaymi Tinner: Franco Nones Other Clinician: Referring Srihith Aquilino: Franco Nones Treating Regina Coppolino/Extender: Linwood Dibbles, HOYT Weeks in Treatment: 1 Visit Information History Since Last Visit Added or deleted any medications: No Patient Arrived: Wheel Chair Any new allergies or adverse reactions: No Arrival Time: 11:48 Had a fall or experienced change in No Accompanied By: staff activities of daily living that may affect Transfer Assistance: Manual risk of falls: Patient Identification Verified: Yes Signs or symptoms of abuse/neglect since last visito No Secondary Verification Process Completed: Yes Hospitalized since last visit: No Implantable device outside of the clinic excluding No cellular tissue based products placed in the center since last visit: Has Dressing in Place as Prescribed: Yes Pain Present Now: No Electronic Signature(s) Signed: 01/09/2018 4:10:25 PM By: Curtis Sites Entered By: Curtis Sites on 01/09/2018 11:49:00 [name] ([id number]) -------------------------------------------------------------------------------- Clinic Level of Care Assessment Details Patient Name: [name] Date of Service: 01/09/2018 11:30 AM Medical Record Number: [id number] Patient Account Number: [id number] Date of Birth/Sex: [birth year] (61 y.o. M) Treating RN: Phillis Haggis Primary Care Emeric Novinger: Franco Nones Other Clinician: Referring Nomar Broad: Franco Nones Treating Fillmore Bynum/Extender: STONE III, HOYT Weeks in Treatment: 1 Clinic Level of Care Assessment Items TOOL 4 Quantity Score X - Use when only an EandM is performed on FOLLOW-UP visit 1 0 ASSESSMENTS - Nursing  Assessment / Reassessment X - Reassessment of Co-morbidities (includes updates in patient status) 1 10 X- 1 5 Reassessment of Adherence to Treatment Plan ASSESSMENTS - Wound and Skin Assessment / Reassessment []  - Simple Wound Assessment / Reassessment - one wound 0 X- 5 5 Complex Wound Assessment / Reassessment - multiple wounds []  - 0 Dermatologic / Skin Assessment (not related to wound area) ASSESSMENTS - Focused Assessment []  - Circumferential Edema Measurements - multi extremities 0 []  - 0 Nutritional Assessment / Counseling / Intervention []  - 0 Lower Extremity Assessment (monofilament, tuning fork, pulses) []  - 0 Peripheral Arterial Disease Assessment (using hand held doppler) ASSESSMENTS - Ostomy and/or Continence Assessment and Care []  - Incontinence Assessment and Management 0 []  - 0 Ostomy Care Assessment and Management (repouching, etc.) PROCESS - Coordination of Care []  - Simple Patient / Family Education for ongoing care 0 X- 1 20 Complex (extensive) Patient / Family Education for ongoing care X- 1 10 Staff obtains Chiropractor, Records, Test Results / Process Orders X- 1 10 Staff telephones HHA, Nursing Homes / Clarify orders / etc []  - 0 Routine Transfer to another Facility (non-emergent condition) []  - 0 Routine Hospital Admission (non-emergent condition) []  - 0 New Admissions / Manufacturing engineer / Ordering NPWT, Apligraf, etc. []  - 0 Emergency Hospital Admission (emergent condition) X- 1 10 Simple Discharge Coordination Balthaser, Deray ([id number]) []  - 0 Complex (extensive) Discharge Coordination PROCESS - Special Needs []  - Pediatric / Minor Patient Management 0 []  - 0 Isolation Patient Management []  - 0 Hearing / Language / Visual special needs []  - 0 Assessment of Community assistance (transportation, D/C planning, etc.) []  - 0 Additional assistance / Altered mentation []  - 0 Support Surface(s) Assessment (bed, cushion, seat,  etc.) INTERVENTIONS - Wound Cleansing / Measurement []  - Simple Wound Cleansing - one wound 0 X- 5 5 Complex Wound Cleansing - multiple wounds X- 1 5 Wound Imaging (photographs - any number of wounds) []  -  0 Wound Tracing (instead of photographs) []  - 0 Simple Wound Measurement - one wound X- 5 5 Complex Wound Measurement - multiple wounds INTERVENTIONS - Wound Dressings X - Small Wound Dressing one or multiple wounds 5 10 []  - 0 Medium Wound Dressing one or multiple wounds []  - 0 Large Wound Dressing one or multiple wounds X- 1 5 Application of Medications - topical []  - 0 Application of Medications - injection INTERVENTIONS - Miscellaneous []  - External ear exam 0 []  - 0 Specimen Collection (cultures, biopsies, blood, body fluids, etc.) []  - 0 Specimen(s) / Culture(s) sent or taken to Lab for analysis []  - 0 Patient Transfer (multiple staff / Nurse, adultHoyer Lift / Similar devices) []  - 0 Simple Staple / Suture removal (25 or less) []  - 0 Complex Staple / Suture removal (26 or more) []  - 0 Hypo / Hyperglycemic Management (close monitor of Blood Glucose) []  - 0 Ankle / Brachial Index (ABI) - do not check if billed separately X- 1 5 Vital Signs [name], [name] ([id number]) Has the patient been seen at the hospital within the last three years: Yes Total Score: 205 Level Of Care: New/Established - Level 5 Electronic Signature(s) Signed: 01/09/2018 4:10:39 PM By: Alejandro MullingPinkerton, Debra Entered By: Alejandro MullingPinkerton, Debra on 01/09/2018 13:11:05 Shawn SchlichterANDLES, [name] ([id number]) -------------------------------------------------------------------------------- Encounter Discharge Information Details Patient Name: Shawn SchlichterANDLES, [name] Date of Service: 01/09/2018 11:30 AM Medical Record Number: [id number] Patient Account Number: [id number] Date of Birth/Sex: 11-Dec-1955 (61 y.o. M) Treating RN: Renne CriglerFlinchum, Cheryl Primary Care Mahamed Zalewski: Franco NonesLINDLEY, CHERYL Other Clinician: Referring Yesica Kemler: Franco NonesLINDLEY,  CHERYL Treating Karma Ansley/Extender: Linwood DibblesSTONE III, HOYT Weeks in Treatment: 1 Encounter Discharge Information Items Discharge Condition: Stable Ambulatory Status: Ambulatory Discharge Destination: Home Transportation: Private Auto Schedule Follow-up Appointment: Yes Clinical Summary of Care: Electronic Signature(s) Signed: 01/10/2018 3:34:51 PM By: Renne CriglerFlinchum, Cheryl Entered By: Renne CriglerFlinchum, Cheryl on 01/09/2018 12:18:37 Shawn SchlichterANDLES, [name] ([id number]) -------------------------------------------------------------------------------- Lower Extremity Assessment Details Patient Name: Shawn SchlichterANDLES, Imer Date of Service: 01/09/2018 11:30 AM Medical Record Number: [id number] Patient Account Number: [id number] Date of Birth/Sex: 11-Dec-1955 (61 y.o. M) Treating RN: Curtis Sitesorthy, Joanna Primary Care Maleiah Dula: Franco NonesLINDLEY, CHERYL Other Clinician: Referring Jak Haggar: Franco NonesLINDLEY, CHERYL Treating Tequia Wolman/Extender: Linwood DibblesSTONE III, HOYT Weeks in Treatment: 1 Electronic Signature(s) Signed: 01/09/2018 4:10:25 PM By: Curtis Sitesorthy, Joanna Entered By: Curtis Sitesorthy, Joanna on 01/09/2018 11:51:05 Shawn SchlichterANDLES, Gerrald ([id number]) -------------------------------------------------------------------------------- Multi Wound Chart Details Patient Name: Shawn SchlichterANDLES, Herbie Date of Service: 01/09/2018 11:30 AM Medical Record Number: [id number] Patient Account Number: [id number] Date of Birth/Sex: 11-Dec-1955 (61 y.o. M) Treating RN: Phillis HaggisPinkerton, Debi Primary Care Sheala Dosh: Franco NonesLINDLEY, CHERYL Other Clinician: Referring Taym Twist: Franco NonesLINDLEY, CHERYL Treating Adrain Butrick/Extender: STONE III, HOYT Weeks in Treatment: 1 Vital Signs Height(in): 70 Pulse(bpm): 76 Weight(lbs): 155 Blood Pressure(mmHg): 96/62 Body Mass Index(BMI): 22 Temperature(F): 98.1 Respiratory Rate 16 (breaths/min): Photos: [1:No Photos] [2:No Photos] [3:No Photos] Wound Location: [1:Right Gluteus] [2:Left Gluteus] [3:Right Ischium] Wounding Event: [1:Pressure Injury] [2:Pressure Injury] [3:Pressure  Injury] Primary Etiology: [1:Pressure Ulcer] [2:Pressure Ulcer] [3:Pressure Ulcer] Comorbid History: [1:Chronic sinus problems/congestion, Anemia, Arrhythmia, Hepatitis C, Type II Diabetes] [2:Chronic sinus problems/congestion, Anemia, Arrhythmia, Hepatitis C, Type II Diabetes] [3:Chronic sinus problems/congestion, Anemia, Arrhythmia,  Hepatitis C, Type II Diabetes] Date Acquired: [1:12/11/2017] [2:12/15/2017] [3:12/15/2017] Weeks of Treatment: [1:1] [2:1] [3:1] Wound Status: [1:Open] [2:Open] [3:Open] Clustered Wound: [1:No] [2:No] [3:No] Measurements L x W x D [1:2.8x1.9x0.1] [2:3.7x0.9x0.1] [3:2.9x4.6x0.1] (cm) Area (cm) : [1:4.178] [2:2.615] [3:10.477] Volume (cm) : [1:0.418] [2:0.262] [3:1.048] % Reduction in Area: [1:-26.60%] [2:44.50%] [3:-39.00%] % Reduction in Volume: [1:-26.70%] [2:44.40%] [3:-39.00%] Classification: [1:Category/Stage III] [2:Category/Stage III] [3:Category/Stage III] Exudate Amount: [1:Large] [2:Large] [  3:Medium] Exudate Type: [1:Serous] [2:Serous] [3:Serous] Exudate Color: [1:amber] [2:amber] [3:amber] Wound Margin: [1:Flat and Intact] [2:Flat and Intact] [3:Flat and Intact] Granulation Amount: [1:Medium (34-66%)] [2:Medium (34-66%)] [3:Medium (34-66%)] Granulation Quality: [1:Red] [2:N/A] [3:Red, Hyper-granulation] Necrotic Amount: [1:Medium (34-66%)] [2:Medium (34-66%)] [3:Medium (34-66%)] Exposed Structures: [1:Fat Layer (Subcutaneous Tissue) Exposed: Yes Fascia: No Tendon: No Muscle: No Joint: No Bone: No] [2:Fat Layer (Subcutaneous Tissue) Exposed: Yes Fascia: No Tendon: No Muscle: No Joint: No Bone: No] [3:Fat Layer (Subcutaneous Tissue) Exposed: Yes  Fascia: No Tendon: No Muscle: No Joint: No Bone: No] Epithelialization: [1:Small (1-33%)] [2:Small (1-33%)] [3:None] Periwound Skin Texture: [1:No Abnormalities Noted] [2:Excoriation: No Induration: No Callus: No] [3:Excoriation: No Induration: No Callus: No] Crepitus: No Crepitus: No Rash: No Rash:  No Scarring: No Scarring: No Periwound Skin Moisture: No Abnormalities Noted Maceration: No Maceration: No Dry/Scaly: No Dry/Scaly: No Periwound Skin Color: No Abnormalities Noted Atrophie Blanche: No Atrophie Blanche: No Cyanosis: No Cyanosis: No Ecchymosis: No Ecchymosis: No Erythema: No Erythema: No Hemosiderin Staining: No Hemosiderin Staining: No Mottled: No Mottled: No Pallor: No Pallor: No Rubor: No Rubor: No Temperature: N/A N/A No Abnormality Tenderness on Palpation: No No Yes Wound Preparation: Ulcer Cleansing: Ulcer Cleansing: Ulcer Cleansing: Rinsed/Irrigated with Saline Rinsed/Irrigated with Saline Rinsed/Irrigated with Saline Topical Anesthetic Applied: Topical Anesthetic Applied: Topical Anesthetic Applied: Other: lidocaine 4% Other: lidocaine 4% Other: lidocaine 4% Wound Number: 4 5 N/A Photos: No Photos No Photos N/A Wound Location: Left Ischium Right Gluteal fold N/A Wounding Event: Pressure Injury Pressure Injury N/A Primary Etiology: Pressure Ulcer Pressure Ulcer N/A Comorbid History: Chronic sinus Chronic sinus N/A problems/congestion, Anemia, problems/congestion, Anemia, Arrhythmia, Hepatitis C, Type Arrhythmia, Hepatitis C, Type II Diabetes II Diabetes Date Acquired: 12/15/2017 12/15/2017 N/A Weeks of Treatment: 1 1 N/A Wound Status: Open Open N/A Clustered Wound: Yes No N/A Measurements L x W x D 0.9x2.8x0.1 4.6x2x0.1 N/A (cm) Area (cm) : 1.979 7.226 N/A Volume (cm) : 0.198 0.723 N/A % Reduction in Area: 59.70% 42.50% N/A % Reduction in Volume: 59.70% 42.50% N/A Classification: Category/Stage III Category/Stage III N/A Exudate Amount: Large Large N/A Exudate Type: Serous Serous N/A Exudate Color: amber amber N/A Wound Margin: Flat and Intact Flat and Intact N/A Granulation Amount: Large (67-100%) Medium (34-66%) N/A Granulation Quality: Red Red N/A Necrotic Amount: Small (1-33%) Medium (34-66%) N/A Exposed Structures: Fascia: No  Fat Layer (Subcutaneous N/A Fat Layer (Subcutaneous Tissue) Exposed: Yes Tissue) Exposed: No Fascia: No Tendon: No Tendon: No Muscle: No Muscle: No Joint: No Joint: No Bone: No Bone: No Epithelialization: None None N/A Periwound Skin Texture: N/A Demuro, [name] ([id number]) Excoriation: No Excoriation: No Induration: No Induration: No Callus: No Callus: No Crepitus: No Crepitus: No Rash: No Rash: No Scarring: No Scarring: No Periwound Skin Moisture: Maceration: No Maceration: No N/A Dry/Scaly: No Dry/Scaly: No Periwound Skin Color: Atrophie Blanche: No Atrophie Blanche: No N/A Cyanosis: No Cyanosis: No Ecchymosis: No Ecchymosis: No Erythema: No Erythema: No Hemosiderin Staining: No Hemosiderin Staining: No Mottled: No Mottled: No Pallor: No Pallor: No Rubor: No Rubor: No Temperature: N/A N/A N/A Tenderness on Palpation: No No N/A Wound Preparation: Ulcer Cleansing: Ulcer Cleansing: N/A Rinsed/Irrigated with Saline Rinsed/Irrigated with Saline Topical Anesthetic Applied: Topical Anesthetic Applied: Other: lidocaine 4% Other: lidocaine 4% Treatment Notes Electronic Signature(s) Signed: 01/09/2018 4:10:39 PM By: Alejandro Mulling Entered By: Alejandro Mulling on 01/09/2018 12:07:53 [name] ([id number]) -------------------------------------------------------------------------------- Multi-Disciplinary Care Plan Details Patient Name: [name] Date of Service: 01/09/2018 11:30 AM Medical Record Number: [id number] Patient Account Number: [id number] Date of  Birth/Sex: [birth year] (62 y.o. M) Treating RN: Phillis Haggis Primary Care Meleane Selinger: Franco Nones Other Clinician: Referring Antawan Mchugh: Franco Nones Treating Orva Riles/Extender: STONE III, HOYT Weeks in Treatment: 1 Active Inactive ` Abuse / Safety / Falls / Self Care Management Nursing Diagnoses: Impaired physical mobility Goals: Patient will not develop complications from  immobility Date Initiated: 01/02/2018 Target Resolution Date: 02/16/2018 Goal Status: Active Interventions: Assess fall risk on admission and as needed Notes: ` Nutrition Nursing Diagnoses: Potential for alteratiion in Nutrition/Potential for imbalanced nutrition Goals: Patient/caregiver agrees to and verbalizes understanding of need to use nutritional supplements and/or vitamins as prescribed Date Initiated: 01/02/2018 Target Resolution Date: 03/17/2018 Goal Status: Active Interventions: Provide education on nutrition Notes: ` Orientation to the Wound Care Program Nursing Diagnoses: Knowledge deficit related to the wound healing center program Goals: Patient/caregiver will verbalize understanding of the Wound Healing Center Program Date Initiated: 01/02/2018 Target Resolution Date: 02/16/2018 Goal Status: Active Interventions: [name], [name] ([id number]) Provide education on orientation to the wound center Notes: ` Pressure Nursing Diagnoses: Potential for impaired tissue integrity related to pressure, friction, moisture, and shear Goals: Patient will remain free of pressure ulcers Date Initiated: 01/02/2018 Target Resolution Date: 03/17/2018 Goal Status: Active Interventions: Assess potential for pressure ulcer upon admission and as needed Notes: ` Wound/Skin Impairment Nursing Diagnoses: Impaired tissue integrity Goals: Ulcer/skin breakdown will heal within 14 weeks Date Initiated: 01/02/2018 Target Resolution Date: 03/16/2018 Goal Status: Active Interventions: Assess patient/caregiver ability to obtain necessary supplies Assess patient/caregiver ability to perform ulcer/skin care regimen upon admission and as needed Assess ulceration(s) every visit Notes: Electronic Signature(s) Signed: 01/09/2018 4:10:39 PM By: Alejandro Mulling Entered By: Alejandro Mulling on 01/09/2018 12:07:45 [name]  ([id number]) -------------------------------------------------------------------------------- Pain Assessment Details Patient Name: [name] Date of Service: 01/09/2018 11:30 AM Medical Record Number: [id number] Patient Account Number: [id number] Date of Birth/Sex: December 19, 1955 (61 y.o. M) Treating RN: Curtis Sites Primary Care Marcia Hartwell: Franco Nones Other Clinician: Referring Corinthian Mizrahi: Franco Nones Treating Jhordyn Hoopingarner/Extender: STONE III, HOYT Weeks in Treatment: 1 Active Problems Location of Pain Severity and Description of Pain Patient Has Paino No Site Locations Pain Management and Medication Current Pain Management: Electronic Signature(s) Signed: 01/09/2018 4:10:25 PM By: Curtis Sites Entered By: Curtis Sites on 01/09/2018 11:49:45 [name] ([id number]) -------------------------------------------------------------------------------- Patient/Caregiver Education Details Patient Name: [name] Date of Service: 01/09/2018 11:30 AM Medical Record Number: [id number] Patient Account Number: [id number] Date of Birth/Gender: [birth year] (61 y.o. M) Treating RN: Renne Crigler Primary Care Physician: Franco Nones Other Clinician: Referring Physician: Franco Nones Treating Physician/Extender: Skeet Simmer in Treatment: 1 Education Assessment Education Provided To: Patient Education Topics Provided Wound Debridement: Handouts: Wound Debridement Methods: Explain/Verbal Responses: State content correctly Wound/Skin Impairment: Handouts: Caring for Your Ulcer Methods: Explain/Verbal Responses: State content correctly Electronic Signature(s) Signed: 01/10/2018 3:34:51 PM By: Renne Crigler Entered By: Renne Crigler on 01/09/2018 12:18:59 [name] ([id number]) -------------------------------------------------------------------------------- Wound Assessment Details Patient Name: [name] Date of Service: 01/09/2018 11:30  AM Medical Record Number: [id number] Patient Account Number: [id number] Date of Birth/Sex: 07-May-1956 (61 y.o. M) Treating RN: Curtis Sites Primary Care Jamichael Knotts: Franco Nones Other Clinician: Referring Crystel Demarco: Franco Nones Treating Catalina Salasar/Extender: STONE III, HOYT Weeks in Treatment: 1 Wound Status Wound Number: 1 Primary Pressure Ulcer Etiology: Wound Location: Right Gluteus Wound Open Wounding Event: Pressure Injury Status: Date Acquired: 12/11/2017 Comorbid Chronic sinus problems/congestion, Anemia, Weeks Of Treatment: 1 History: Arrhythmia, Hepatitis C, Type II Diabetes Clustered Wound: No Photos Photo Uploaded By: Curtis Sites on 01/09/2018 13:05:57 Wound Measurements Length: (cm) 2.8  Width: (cm) 1.9 Depth: (cm) 0.1 Area: (cm) 4.178 Volume: (cm) 0.418 % Reduction in Area: -26.6% % Reduction in Volume: -26.7% Epithelialization: Small (1-33%) Tunneling: No Undermining: No Wound Description Classification: Category/Stage III Wound Margin: Flat and Intact Exudate Amount: Large Exudate Type: Serous Exudate Color: amber Foul Odor After Cleansing: No Slough/Fibrino Yes Wound Bed Granulation Amount: Medium (34-66%) Exposed Structure Granulation Quality: Red Fascia Exposed: No Necrotic Amount: Medium (34-66%) Fat Layer (Subcutaneous Tissue) Exposed: Yes Necrotic Quality: Adherent Slough Tendon Exposed: No Muscle Exposed: No Joint Exposed: No Bone Exposed: No Periwound Skin Texture [name], [name] ([id number]) Texture Color No Abnormalities Noted: No No Abnormalities Noted: No Moisture No Abnormalities Noted: No Wound Preparation Ulcer Cleansing: Rinsed/Irrigated with Saline Topical Anesthetic Applied: Other: lidocaine 4%, Treatment Notes Wound #1 (Right Gluteus) 1. Cleansed with: Clean wound with Normal Saline 2. Anesthetic Topical Lidocaine 4% cream to wound bed prior to debridement 4. Dressing Applied: Hydrafera Blue 5. Secondary  Dressing Applied Bordered Foam Dressing Electronic Signature(s) Signed: 01/09/2018 4:10:25 PM By: Curtis Sites Entered By: Curtis Sites on 01/09/2018 11:55:11 [name] ([id number]) -------------------------------------------------------------------------------- Wound Assessment Details Patient Name: [name] Date of Service: 01/09/2018 11:30 AM Medical Record Number: [id number] Patient Account Number: [id number] Date of Birth/Sex: 01-16-1956 (61 y.o. M) Treating RN: Curtis Sites Primary Care Ryka Beighley: Franco Nones Other Clinician: Referring Ilaria Much: Franco Nones Treating Jiraiya Mcewan/Extender: STONE III, HOYT Weeks in Treatment: 1 Wound Status Wound Number: 2 Primary Pressure Ulcer Etiology: Wound Location: Left Gluteus Wound Open Wounding Event: Pressure Injury Status: Date Acquired: 12/15/2017 Comorbid Chronic sinus problems/congestion, Anemia, Weeks Of Treatment: 1 History: Arrhythmia, Hepatitis C, Type II Diabetes Clustered Wound: No Photos Photo Uploaded By: Curtis Sites on 01/09/2018 13:04:49 Wound Measurements Length: (cm) 3.7 Width: (cm) 0.9 Depth: (cm) 0.1 Area: (cm) 2.615 Volume: (cm) 0.262 % Reduction in Area: 44.5% % Reduction in Volume: 44.4% Epithelialization: Small (1-33%) Tunneling: No Undermining: No Wound Description Classification: Category/Stage III Wound Margin: Flat and Intact Exudate Amount: Large Exudate Type: Serous Exudate Color: amber Foul Odor After Cleansing: No Slough/Fibrino Yes Wound Bed Granulation Amount: Medium (34-66%) Exposed Structure Necrotic Amount: Medium (34-66%) Fascia Exposed: No Necrotic Quality: Adherent Slough Fat Layer (Subcutaneous Tissue) Exposed: Yes Tendon Exposed: No Muscle Exposed: No Joint Exposed: No Bone Exposed: No Periwound Skin Texture [name], [name] ([id number]) Texture Color No Abnormalities Noted: No No Abnormalities Noted: No Callus: No Atrophie Blanche: No Crepitus:  No Cyanosis: No Excoriation: No Ecchymosis: No Induration: No Erythema: No Rash: No Hemosiderin Staining: No Scarring: No Mottled: No Pallor: No Moisture Rubor: No No Abnormalities Noted: No Dry / Scaly: No Maceration: No Wound Preparation Ulcer Cleansing: Rinsed/Irrigated with Saline Topical Anesthetic Applied: Other: lidocaine 4%, Treatment Notes Wound #2 (Left Gluteus) 1. Cleansed with: Clean wound with Normal Saline 2. Anesthetic Topical Lidocaine 4% cream to wound bed prior to debridement 4. Dressing Applied: Hydrafera Blue 5. Secondary Dressing Applied Bordered Foam Dressing Electronic Signature(s) Signed: 01/09/2018 4:10:25 PM By: Curtis Sites Entered By: Curtis Sites on 01/09/2018 11:55:46 [name] ([id number]) -------------------------------------------------------------------------------- Wound Assessment Details Patient Name: [name] Date of Service: 01/09/2018 11:30 AM Medical Record Number: [id number] Patient Account Number: [id number] Date of Birth/Sex: 1955/07/15 (61 y.o. M) Treating RN: Curtis Sites Primary Care Anubis Fundora: Franco Nones Other Clinician: Referring Julie Nay: Franco Nones Treating Latasia Silberstein/Extender: STONE III, HOYT Weeks in Treatment: 1 Wound Status Wound Number: 3 Primary Pressure Ulcer Etiology: Wound Location: Right Ischium Wound Open Wounding Event: Pressure Injury Status: Date Acquired: 12/15/2017 Comorbid Chronic sinus problems/congestion, Anemia, Weeks Of Treatment:  1 History: Arrhythmia, Hepatitis C, Type II Diabetes Clustered Wound: No Photos Photo Uploaded By: Curtis Sites on 01/09/2018 13:04:49 Wound Measurements Length: (cm) 2.9 Width: (cm) 4.6 Depth: (cm) 0.1 Area: (cm) 10.477 Volume: (cm) 1.048 % Reduction in Area: -39% % Reduction in Volume: -39% Epithelialization: None Tunneling: No Undermining: No Wound Description Classification: Category/Stage III Wound Margin: Flat and  Intact Exudate Amount: Medium Exudate Type: Serous Exudate Color: amber Foul Odor After Cleansing: No Slough/Fibrino Yes Wound Bed Granulation Amount: Medium (34-66%) Exposed Structure Granulation Quality: Red, Hyper-granulation Fascia Exposed: No Necrotic Amount: Medium (34-66%) Fat Layer (Subcutaneous Tissue) Exposed: Yes Necrotic Quality: Adherent Slough Tendon Exposed: No Muscle Exposed: No Joint Exposed: No Bone Exposed: No Periwound Skin Texture [name], [name] ([id number]) Texture Color No Abnormalities Noted: No No Abnormalities Noted: No Callus: No Atrophie Blanche: No Crepitus: No Cyanosis: No Excoriation: No Ecchymosis: No Induration: No Erythema: No Rash: No Hemosiderin Staining: No Scarring: No Mottled: No Pallor: No Moisture Rubor: No No Abnormalities Noted: No Dry / Scaly: No Temperature / Pain Maceration: No Temperature: No Abnormality Tenderness on Palpation: Yes Wound Preparation Ulcer Cleansing: Rinsed/Irrigated with Saline Topical Anesthetic Applied: Other: lidocaine 4%, Treatment Notes Wound #3 (Right Ischium) 1. Cleansed with: Clean wound with Normal Saline 2. Anesthetic Topical Lidocaine 4% cream to wound bed prior to debridement 4. Dressing Applied: Hydrafera Blue 5. Secondary Dressing Applied Bordered Foam Dressing Electronic Signature(s) Signed: 01/09/2018 4:10:25 PM By: Curtis Sites Entered By: Curtis Sites on 01/09/2018 11:56:16 [name] ([id number]) -------------------------------------------------------------------------------- Wound Assessment Details Patient Name: [name] Date of Service: 01/09/2018 11:30 AM Medical Record Number: [id number] Patient Account Number: [id number] Date of Birth/Sex: [birth year] (61 y.o. M) Treating RN: Curtis Sites Primary Care Chaney Maclaren: Franco Nones Other Clinician: Referring Jaedynn Bohlken: Franco Nones Treating Hettie Roselli/Extender: STONE III, HOYT Weeks in Treatment:  1 Wound Status Wound Number: 4 Primary Pressure Ulcer Etiology: Wound Location: Left Ischium Wound Open Wounding Event: Pressure Injury Status: Date Acquired: 12/15/2017 Comorbid Chronic sinus problems/congestion, Anemia, Weeks Of Treatment: 1 History: Arrhythmia, Hepatitis C, Type II Diabetes Clustered Wound: Yes Photos Photo Uploaded By: Curtis Sites on 01/09/2018 13:05:16 Wound Measurements Length: (cm) 0.9 Width: (cm) 2.8 Depth: (cm) 0.1 Area: (cm) 1.979 Volume: (cm) 0.198 % Reduction in Area: 59.7% % Reduction in Volume: 59.7% Epithelialization: None Tunneling: No Undermining: No Wound Description Classification: Category/Stage III Wound Margin: Flat and Intact Exudate Amount: Large Exudate Type: Serous Exudate Color: amber Foul Odor After Cleansing: No Slough/Fibrino Yes Wound Bed Granulation Amount: Large (67-100%) Exposed Structure Granulation Quality: Red Fascia Exposed: No Necrotic Amount: Small (1-33%) Fat Layer (Subcutaneous Tissue) Exposed: No Necrotic Quality: Adherent Slough Tendon Exposed: No Muscle Exposed: No Joint Exposed: No Bone Exposed: No Periwound Skin Texture [name], [name] ([id number]) Texture Color No Abnormalities Noted: No No Abnormalities Noted: No Callus: No Atrophie Blanche: No Crepitus: No Cyanosis: No Excoriation: No Ecchymosis: No Induration: No Erythema: No Rash: No Hemosiderin Staining: No Scarring: No Mottled: No Pallor: No Moisture Rubor: No No Abnormalities Noted: No Dry / Scaly: No Maceration: No Wound Preparation Ulcer Cleansing: Rinsed/Irrigated with Saline Topical Anesthetic Applied: Other: lidocaine 4%, Treatment Notes Wound #4 (Left Ischium) 1. Cleansed with: Clean wound with Normal Saline 2. Anesthetic Topical Lidocaine 4% cream to wound bed prior to debridement 4. Dressing Applied: Hydrafera Blue 5. Secondary Dressing Applied Bordered Foam Dressing Electronic Signature(s) Signed:  01/09/2018 4:10:25 PM By: Curtis Sites Entered By: Curtis Sites on 01/09/2018 11:56:51 [name] ([id number]) -------------------------------------------------------------------------------- Wound Assessment Details Patient Name: [name] Date of  Service: 01/09/2018 11:30 AM Medical Record Number: [id number] Patient Account Number: [id number] Date of Birth/Sex: 31-Mar-1956 (61 y.o. M) Treating RN: Curtis Sites Primary Care Delmas Faucett: Franco Nones Other Clinician: Referring Baltazar Pekala: Franco Nones Treating Hurschel Paynter/Extender: STONE III, HOYT Weeks in Treatment: 1 Wound Status Wound Number: 5 Primary Pressure Ulcer Etiology: Wound Location: Right Gluteal fold Wound Open Wounding Event: Pressure Injury Status: Date Acquired: 12/15/2017 Comorbid Chronic sinus problems/congestion, Anemia, Weeks Of Treatment: 1 History: Arrhythmia, Hepatitis C, Type II Diabetes Clustered Wound: No Photos Photo Uploaded By: Curtis Sites on 01/09/2018 13:05:17 Wound Measurements Length: (cm) 4.6 Width: (cm) 2 Depth: (cm) 0.1 Area: (cm) 7.226 Volume: (cm) 0.723 % Reduction in Area: 42.5% % Reduction in Volume: 42.5% Epithelialization: None Tunneling: No Undermining: No Wound Description Classification: Category/Stage III Wound Margin: Flat and Intact Exudate Amount: Large Exudate Type: Serous Exudate Color: amber Foul Odor After Cleansing: No Wound Bed Granulation Amount: Medium (34-66%) Exposed Structure Granulation Quality: Red Fascia Exposed: No Necrotic Amount: Medium (34-66%) Fat Layer (Subcutaneous Tissue) Exposed: Yes Necrotic Quality: Adherent Slough Tendon Exposed: No Muscle Exposed: No Joint Exposed: No Bone Exposed: No Periwound Skin Texture [name], [name] ([id number]) Texture Color No Abnormalities Noted: No No Abnormalities Noted: No Callus: No Atrophie Blanche: No Crepitus: No Cyanosis: No Excoriation: No Ecchymosis: No Induration:  No Erythema: No Rash: No Hemosiderin Staining: No Scarring: No Mottled: No Pallor: No Moisture Rubor: No No Abnormalities Noted: No Dry / Scaly: No Maceration: No Wound Preparation Ulcer Cleansing: Rinsed/Irrigated with Saline Topical Anesthetic Applied: Other: lidocaine 4%, Treatment Notes Wound #5 (Right Gluteal fold) 1. Cleansed with: Clean wound with Normal Saline 2. Anesthetic Topical Lidocaine 4% cream to wound bed prior to debridement 4. Dressing Applied: Hydrafera Blue 5. Secondary Dressing Applied Bordered Foam Dressing Electronic Signature(s) Signed: 01/09/2018 4:10:25 PM By: Curtis Sites Entered By: Curtis Sites on 01/09/2018 11:57:18 [name] ([id number]) -------------------------------------------------------------------------------- Vitals Details Patient Name: [name] Date of Service: 01/09/2018 11:30 AM Medical Record Number: [id number] Patient Account Number: [id number] Date of Birth/Sex: 1955-11-07 (61 y.o. M) Treating RN: Curtis Sites Primary Care Jackelynn Hosie: Franco Nones Other Clinician: Referring Amia Rynders: Franco Nones Treating Cristyn Crossno/Extender: STONE III, HOYT Weeks in Treatment: 1 Vital Signs Time Taken: 11:50 Temperature (F): 98.1 Height (in): 70 Pulse (bpm): 76 Weight (lbs): 155 Respiratory Rate (breaths/min): 16 Body Mass Index (BMI): 22.2 Blood Pressure (mmHg): 96/62 Reference Range: 80 - 120 mg / dl Electronic Signature(s) Signed: 01/09/2018 4:10:25 PM By: Curtis Sites Entered By: Curtis Sites on 01/09/2018 11:50:57

## 2018-01-23 ENCOUNTER — Encounter: Payer: Medicaid Other | Admitting: Physician Assistant

## 2018-01-23 DIAGNOSIS — L89313 Pressure ulcer of right buttock, stage 3: Secondary | ICD-10-CM | POA: Diagnosis not present

## 2018-01-25 NOTE — Progress Notes (Signed)
ARVO, EALY ([id number]) Visit Report for 01/23/2018 Chief Complaint Document Details Patient Name: [name], [name] Date of Service: 01/23/2018 1:45 PM Medical Record Number: [id number] Patient Account Number: [id number] Date of Birth/Sex: [birth year] (62 y.o. M) Treating RN: Phillis Haggis Primary Care Provider: Franco Nones Other Clinician: Referring Provider: Franco Nones Treating Provider/Extender: Linwood Dibbles, HOYT Weeks in Treatment: 3 Information Obtained from: Patient Chief Complaint Bilateral Gluteal Ulcers Electronic Signature(s) Signed: 01/24/2018 1:39:21 PM By: Lenda Kelp PA-C Entered By: Lenda Kelp on 01/23/2018 14:15:29 [name] ([id number]) -------------------------------------------------------------------------------- HPI Details Patient Name: [name] Date of Service: 01/23/2018 1:45 PM Medical Record Number: [id number] Patient Account Number: [id number] Date of Birth/Sex: [birth year] (62 y.o. M) Treating RN: Phillis Haggis Primary Care Provider: Franco Nones Other Clinician: Referring Provider: Franco Nones Treating Provider/Extender: Linwood Dibbles, HOYT Weeks in Treatment: 3 History of Present Illness HPI Description: 09/14/17-he is here for evaluation of the left lower extremity injury. He is a resident of a group home and is accompanied by facility staff. He went to his primary care on 2/12 for a left lower extremity wound. At that appointment a wound culture was taken. A referral for wound care services was ordered on 2/26 and according to facility med rec he was started on Bactrim on 3/5. The patient and facility staff are very poor historians. It is assumed that the injury to the left leg was from scratching. He presents today with no open area, no erythema, no evidence of drainage. He is going to PCP later today for follow up. Readmission: 01/02/18 on evaluation today patient presents for reevaluation in the clinic although this is  for a separate issue. He actually has several states to the pressure ulcers along his bilateral gluteal region which apparently have been present for several weeks now. He did see his primary care provider two weeks ago and they did initiate treatment. Home health was finally obtained although they were having a difficult time getting home health. It sounds like doing is actually used for the wound beds obviously don't think this is gonna be the best treatment option for him at this point. Fortunately there does not appear to be any evidence of infection at this time the patient does have discomfort but not as much as he apparently had in the past. When he was originally dealing with these before going to see his primary care provider the pain was much more significant. He does have a foam cushion for his wheelchair although I think being that he is wheelchair dependent he may benefit from a Roho cushion at this point. He also may benefit from an air mattress in my pinion he does have a hospital bed but again between the bed and his wheelchair he pretty much spends all of his time during the day and one of these two locations. I do think he staying too long in the wheelchair as far as sitting up as well which also I think needs to be addressed I did have a long conversation with him today concerning the fact that he needs to rotate positions and locations at least every two hours. This is something we're going to put on the notes as well to sin with him to the facility. I do think that this is going to be of utmost importance the dressings can be helpful but they are not gonna be able to manage this completely alone. He does have some hyper granular tissue noted. 01/09/18 on evaluation today patient presents for reevaluation concerning  multiple pressure ulcers noted of the bilateral gluteal region. He has been tolerating the dressing changes without complication the good news is he seems to be making  great progress at this point in time. There does not appear to be any evidence of infection at this time. No fevers, chills, nausea, or vomiting noted at this time. 01/23/18 Patient's wounds seem to show signs of good improvement at this point in time. He has been tolerating the dressing changes without complication. With that being said I do believe that he has been making excellent progress over the past several weeks. No fevers, chills, nausea, or vomiting noted at this time. Electronic Signature(s) Signed: 01/24/2018 1:39:21 PM By: Lenda Kelp PA-C Entered By: Lenda Kelp on 01/24/2018 07:42:07 [name], [name] ([id number]) -------------------------------------------------------------------------------- Physical Exam Details Patient Name: [name] Date of Service: 01/23/2018 1:45 PM Medical Record Number: [id number] Patient Account Number: [id number] Date of Birth/Sex: 1955/12/18 (62 y.o. M) Treating RN: Phillis Haggis Primary Care Provider: Franco Nones Other Clinician: Referring Provider: Franco Nones Treating Provider/Extender: STONE III, HOYT Weeks in Treatment: 3 Constitutional Well-nourished and well-hydrated in no acute distress. Respiratory normal breathing without difficulty. clear to auscultation bilaterally. Cardiovascular regular rate and rhythm with normal S1, S2. Psychiatric this patient is able to make decisions and demonstrates good insight into disease process. Alert and Oriented x 3. pleasant and cooperative. Notes At this point patient's wound bed does seem to show signs of good granulation at all locations which is excellent news. He does not seem to have any evidence of infection which is also good news. In general I'm very pleased with how things seem to be progressing. No debridement was required today. Electronic Signature(s) Signed: 01/24/2018 1:39:21 PM By: Lenda Kelp PA-C Entered By: Lenda Kelp on 01/24/2018 07:42:45 [name] ([id number]) -------------------------------------------------------------------------------- Physician Orders Details Patient Name: [name] Date of Service: 01/23/2018 1:45 PM Medical Record Number: [id number] Patient Account Number: [id number] Date of Birth/Sex: 10/26/1955 (61 y.o. 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 o Cleanse wound with mild soap and water o May Shower, gently pat wound dry prior to applying new dressing. Wound #2 Left Gluteus o Clean wound with Normal Saline. o Cleanse wound with mild soap and water o May Shower, gently pat wound dry prior to applying new dressing. Wound #3 Right Ischium o Clean wound with Normal Saline. o Cleanse wound with mild soap and water o May Shower, gently pat wound dry prior to applying new dressing. Wound #4 Left Ischium o Clean wound with Normal Saline. o Cleanse wound with mild soap and water o May Shower, gently pat wound dry prior to applying new dressing. Wound #5 Right Gluteal fold o Clean wound with Normal Saline. o Cleanse wound with mild soap and water o May Shower, gently pat wound dry prior to applying new dressing. Anesthetic (add to Medication List) Wound #1 Right Gluteus o Topical Lidocaine 4% cream applied to  wound bed prior  to debridement (In Clinic Only). Wound #2 Left Gluteus o Topical Lidocaine 4% cream applied to wound bed prior to debridement (In Clinic Only). ALADDIN, KOLLMANN ([id number]) Wound #3 Right Ischium o Topical Lidocaine 4% cream applied to wound bed prior to debridement (In Clinic Only). Wound #4 Left Ischium o Topical Lidocaine 4% cream applied to wound bed prior to debridement (In Clinic Only). Wound #5 Right Gluteal fold o Topical Lidocaine 4% cream applied to wound bed prior to debridement (In Clinic Only). Primary Wound Dressing Wound #1 Right Gluteus o Hydrafera Blue Ready Transfer Wound #2 Left Gluteus o Hydrafera Blue Ready Transfer Wound #3 Right Ischium o Hydrafera Blue Ready Transfer Wound #4 Left Ischium o Hydrafera Blue Ready Transfer Wound #5 Right Gluteal fold o Hydrafera Blue Ready Transfer Secondary Dressing Wound #1 Right Gluteus o Boardered Foam Dressing Wound #2 Left Gluteus o Boardered Foam Dressing Wound #3 Right Ischium o Boardered Foam Dressing Wound #4 Left Ischium o Boardered Foam Dressing Wound #5 Right Gluteal fold o Boardered Foam Dressing Dressing Change Frequency Wound #1 Right Gluteus o Change Dressing Monday, Wednesday, Friday - and as needed Wound #2 Left Gluteus o Change Dressing Monday, Wednesday, Friday - and as needed Wound #3 Right Ischium o Change Dressing Monday, Wednesday, Friday - and as needed Wound #4 Left Ischium o Change Dressing Monday, Wednesday, Friday - and as needed Wound #5 Right Gluteal fold [name], [name] ([id number]) o Change Dressing Monday, Wednesday, Friday - and as needed Follow-up Appointments Wound #1 Right Gluteus o Return Appointment in 2 weeks. Wound #2 Left Gluteus o Return Appointment in 2 weeks. Wound #3 Right Ischium o Return Appointment in 2 weeks. Wound #4 Left Ischium o Return Appointment in 2 weeks. Wound #5 Right Gluteal fold o  Return Appointment in 2 weeks. Off-Loading Wound #1 Right Gluteus o Roho cushion for wheelchair - ordered by [facility] o Mattress - ordered by [facility] o Turn and reposition every 2 hours Wound #2 Left Gluteus o Roho cushion for wheelchair - ordered by [facility] o Mattress - ordered by [facility] o Turn and reposition every 2 hours Wound #3 Right Ischium o Roho cushion for wheelchair - ordered by [facility] o Mattress - ordered by [facility] o Turn and reposition every 2 hours Wound #4 Left Ischium o Roho cushion for wheelchair - ordered by [facility] o Mattress - ordered by [facility] o Turn and reposition every 2 hours Wound #5 Right Gluteal fold o Roho cushion for wheelchair - ordered by [facility] o Mattress - ordered by [facility] o Turn and reposition every 2 hours Additional Orders / Instructions Wound #1 Right Gluteus o Increase protein intake. o Activity as tolerated Wound #2 Left Gluteus o Increase protein intake. o Activity as tolerated Wound #3 Right Ischium o Increase protein intake. o Activity as tolerated [name], [name] ([id number]) Wound #4 Left Ischium o Increase protein intake. o Activity as tolerated Wound #5 Right Gluteal fold o Increase protein intake. o Activity as tolerated Home Health Wound #1 Right Gluteus o Continue Home [facility] Visits - [facility] o Home Health Nurse may visit PRN to address patientos wound care needs. o FACE TO FACE ENCOUNTER: MEDICARE and MEDICAID PATIENTS: I certify that this patient is under my care and that I had a face-to-face encounter that meets the physician face-to-face encounter requirements with this patient on  this date. The encounter with the patient was in whole or in part for the following MEDICAL CONDITION:  (primary reason for Home Healthcare) MEDICAL NECESSITY: I certify, that based on my findings, NURSING services are a medically necessary home health service. HOME BOUND STATUS: I certify that my clinical findings support that this patient is homebound (i.e., Due to illness or injury, pt requires aid of supportive devices such as crutches, cane, wheelchairs, walkers, the use of special transportation or the assistance of another person to leave their place of residence. There is a normal inability to leave the home and doing so requires considerable and taxing effort. Other absences are for medical reasons / religious services and are infrequent or of short duration when for other reasons). o If current dressing causes regression in wound condition, may D/C ordered dressing product/s and apply Normal Saline Moist Dressing daily until next Wound Healing Center / Other MD appointment. Notify Wound Healing Center of regression in wound condition at [phone]. o Please direct any NON-WOUND related issues/requests for orders to patient's Primary Care Physician Wound #2 Left Gluteus o Continue [facility] Visits - [facility] o Home Health Nurse may visit PRN to address patientos wound care needs. o FACE TO FACE ENCOUNTER: MEDICARE and MEDICAID PATIENTS: I certify that this patient is under my care and that I had a face-to-face encounter that meets the physician face-to-face encounter requirements with this patient on this date. The encounter with the patient was in whole or in part for the following MEDICAL CONDITION: (primary reason for Home Healthcare) MEDICAL NECESSITY: I certify, that based on my findings, NURSING services are a medically necessary home health service. HOME BOUND STATUS: I certify that my clinical findings support that this patient is homebound (i.e., Due to illness or injury, pt requires aid of supportive devices such as crutches, cane, wheelchairs, walkers, the use of  special transportation or the assistance of another person to leave their place of residence. There is a normal inability to leave the home and doing so requires considerable and taxing effort. Other absences are for medical reasons / religious services and are infrequent or of short duration when for other reasons). o If current dressing causes regression in wound condition, may D/C ordered dressing product/s and apply Normal Saline Moist Dressing daily until next Wound Healing Center / Other MD appointment. Notify Wound Healing Center of regression in wound condition at [phone]. o Please direct any NON-WOUND related issues/requests for orders to patient's Primary Care Physician Wound #3 Right Ischium o Continue Home [facility] Visits - [facility] o Home Health Nurse may visit PRN to address patientos wound care needs. o FACE TO FACE ENCOUNTER: MEDICARE and MEDICAID PATIENTS: I certify that this patient is under my care and that I had a face-to-face encounter that meets the physician face-to-face encounter requirements with this patient on this date. The encounter with the patient was in whole or in part for the following MEDICAL CONDITION: (primary reason for Home Healthcare) MEDICAL NECESSITY: I certify, that based on my findings, NURSING services are a medically necessary home health service. HOME BOUND STATUS: I certify that my clinical findings support that this patient is homebound (i.e., Due to illness or injury, pt requires aid of supportive devices such as crutches, cane, wheelchairs, walkers, the use of special transportation or the assistance of another person to leave their place of residence. There is a normal inability to leave the home [name], [name] ([id number]) and doing so requires considerable and taxing effort. Other  absences are for medical reasons / religious services and are infrequent or of short duration when for other reasons). o If current dressing causes  regression in wound condition, may D/C ordered dressing product/s and apply Normal Saline Moist Dressing daily until next Wound Healing Center / Other MD appointment. Notify Wound Healing Center of regression in wound condition at [phone]. o Please direct any NON-WOUND related issues/requests for orders to patient's Primary Care Physician Wound #4 Left Ischium o Continue [facility] Visits - [facility] o Home Health Nurse may visit PRN to address patientos wound care needs. o FACE TO FACE ENCOUNTER: MEDICARE and MEDICAID PATIENTS: I certify that this patient is under my care and that I had a face-to-face encounter that meets the physician face-to-face encounter requirements with this patient on this date. The encounter with the patient was in whole or in part for the following MEDICAL CONDITION: (primary reason for Home Healthcare) MEDICAL NECESSITY: I certify, that based on my findings, NURSING services are a medically necessary home health service. HOME BOUND STATUS: I certify that my clinical findings support that this patient is homebound (i.e., Due to illness or injury, pt requires aid of supportive devices such as crutches, cane, wheelchairs, walkers, the use of special transportation or the assistance of another person to leave their place of residence. There is a normal inability to leave the home and doing so requires considerable and taxing effort. Other absences are for medical reasons / religious services and are infrequent or of short duration when for other reasons). o If current dressing causes regression in wound condition, may D/C ordered dressing product/s and apply Normal Saline Moist Dressing daily until next Wound Healing Center / Other MD appointment. Notify Wound Healing Center of regression in wound condition at [phone]. o Please direct any NON-WOUND related issues/requests for orders to patient's Primary Care Physician Wound #5 Right Gluteal  fold o Continue [facility] Visits - [facility] o Home Health Nurse may visit PRN to address patientos wound care needs. o FACE TO FACE ENCOUNTER: MEDICARE and MEDICAID PATIENTS: I certify that this patient is under my care and that I had a face-to-face encounter that meets the physician face-to-face encounter requirements with this patient on this date. The encounter with the patient was in whole or in part for the following MEDICAL CONDITION: (primary reason for Home Healthcare) MEDICAL NECESSITY: I certify, that based on my findings, NURSING services are a medically necessary home health service. HOME BOUND STATUS: I certify that my clinical findings support that this patient is homebound (i.e., Due to illness or injury, pt requires aid of supportive devices such as crutches, cane, wheelchairs, walkers, the use of special transportation or the assistance of another person to leave their place of residence. There is a normal inability to leave the home and doing so requires considerable and taxing effort. Other absences are for medical reasons / religious services and are infrequent or of short duration when for other reasons). o If current dressing causes regression in wound condition, may D/C ordered dressing product/s and apply Normal Saline Moist Dressing daily until next Wound Healing Center / Other MD appointment. Notify Wound Healing Center of regression in wound condition at [phone]. o Please direct any NON-WOUND related issues/requests for orders to patient's Primary Care Physician Patient Medications Allergies: Cephalosporins, penicillin, cefazolin Notifications Medication Indication Start End lidocaine DOSE 1 - topical 4 % cream - 1 cream topical Electronic Signature(s) Signed: 01/24/2018 1:39:21 PM By: Lenda Kelp PA-C Signed: 01/24/2018 5:07:56  PM By: Alejandro MullingPinkerton, Debra Entered By: Alejandro MullingPinkerton, Debra on 01/23/2018 14:46:54 [place], Molly MaduroOBERT ([id number]) Shawn SchlichterANDLES,  [name] ([id number]) -------------------------------------------------------------------------------- Prescription 01/23/2018 Patient Name: Shawn SchlichterANDLES, [name] Provider: Lenda KelpSTONE III, HOYT PA-C Date of Birth: 08-Mar-1956 NPI#: [id number] Sex: M DEA#: [id number] Phone #: [phone] License #: Patient Address[facility] [address] [facility] [place], [place] [address] [address], [address] [place], [place] [address] [phone] Allergies Cephalosporins penicillin cefazolin Medication Medication: Route: Strength: Form: lidocaine topical 4% cream Class: TOPICAL LOCAL ANESTHETICS Dose: Frequency / Time: Indication: 1 1 cream topical Number of Refills: Number of Units: 0 Generic Substitution: Start Date: End Date: Administered at Substitution Permitted Facility: Yes Time Administered: Time Discontinued: Note to Pharmacy: Signature(s): Date(s): Electronic Signature(s) Shawn SchlichterRANDLES, Mayes ([id number]) Signed: 01/24/2018 1:39:21 PM By: Lenda KelpStone III, Hoyt PA-C Signed: 01/24/2018 5:07:56 PM By: Alejandro MullingPinkerton, Debra Entered By: Alejandro MullingPinkerton, Debra on 01/23/2018 14:46:56 Shawn SchlichterANDLES, [name] ([id number]) --------------------------------------------------------------------------------  Problem List Details Patient Name: Shawn SchlichterANDLES, Tatsuo Date of Service: 01/23/2018 1:45 PM Medical Record Number: [id number] Patient Account Number: [id number] Date of Birth/Sex: 08-Mar-1956 (61 y.o. 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 01/02/2018 No Yes F17.210 Nicotine dependence, cigarettes, uncomplicated 01/02/2018 No Yes K70.30 Alcoholic cirrhosis of liver without ascites 01/02/2018 No Yes Inactive Problems Resolved Problems Electronic Signature(s) Signed: 01/24/2018 1:39:21 PM By: Lenda KelpStone III, Hoyt PA-C Entered By: Lenda KelpStone III, Hoyt on 01/23/2018 14:15:16 Shawn SchlichterANDLES, [name] ([id number]) -------------------------------------------------------------------------------- Progress Note Details Patient Name: Shawn SchlichterANDLES, [name] Date of Service: 01/23/2018 1:45 PM Medical Record Number: [id number] Patient Account Number: [id number] Date of Birth/Sex: 08-Mar-1956 (61 y.o. M) Treating RN: Phillis HaggisPinkerton, Debi Primary Care Provider: Franco NonesLINDLEY, CHERYL Other Clinician: Referring Provider: Franco NonesLINDLEY, CHERYL Treating Provider/Extender: Linwood DibblesSTONE III, HOYT Weeks in Treatment: 3 Subjective Chief Complaint Information obtained from Patient Bilateral Gluteal Ulcers History of Present Illness (HPI) 09/14/17-he is here for evaluation of the left lower extremity injury. He is a resident of a group home and is accompanied by facility staff. He went to his primary care on 2/12 for a left lower extremity wound. At that appointment a wound culture was taken. A referral for wound care services was ordered on 2/26 and according to facility med rec he was started on Bactrim on 3/5. The patient and facility staff are very poor historians. It is assumed that the injury to the left leg was from scratching. He presents today with no open area, no erythema, no evidence of drainage. He is going to PCP later today for follow up. Readmission: 01/02/18 on evaluation today patient presents for reevaluation in the clinic although this is for a separate issue. He actually has several states to the pressure ulcers along his bilateral gluteal region which apparently have been present for several  weeks now. He did see his primary care provider two weeks ago and they did initiate treatment. Home health was finally obtained although they were having a difficult time getting home health. It sounds like doing is actually used for the wound beds obviously don't think this is gonna be the best treatment option for him at this point. Fortunately there  does not appear to be any evidence of infection at this time the patient does have discomfort but not as much as he apparently had in the past. When he was originally dealing with these before going to see his primary care provider the pain was much more significant. He does have a foam cushion for his wheelchair although I think being that he is wheelchair dependent he may benefit from a Roho cushion at this point. He also may benefit from an air mattress in my pinion he does have a hospital bed but again between the bed and his wheelchair he pretty much spends all of his time during the day and one of these two locations. I do think he staying too long in the wheelchair as far as sitting up as well which also I think needs to be addressed I did have a long conversation with him today concerning the fact that he needs to rotate positions and locations at least every two hours. This is something we're going to put on the notes as well to sin with him to the facility. I do think that this is going to be of utmost importance the dressings can be helpful but they are not gonna be able to manage this completely alone. He does have some hyper granular tissue noted. 01/09/18 on evaluation today patient presents for reevaluation concerning multiple pressure ulcers noted of the bilateral gluteal region. He has been tolerating the dressing changes without complication the good news is he seems to be making great progress at this point in time. There does not appear to be any evidence of infection at this time. No fevers, chills, nausea, or vomiting noted at this  time. 01/23/18 Patient's wounds seem to show signs of good improvement at this point in time. He has been tolerating the dressing changes without complication. With that being said I do believe that he has been making excellent progress over the past several weeks. No fevers, chills, nausea, or vomiting noted at this time. Patient History Information obtained from Patient. Family History Heart Disease - Siblings, Hypertension - Siblings, Lung Disease - Siblings, No family history of Cancer, Diabetes, Hereditary Spherocytosis, Kidney Disease, Seizures, Stroke, Thyroid Problems, Tuberculosis. [name], [name] ([id number]) Social History Current every day smoker, Marital Status - Single, Alcohol Use - Never - history of ETOH abuse, Drug Use - Prior History, Caffeine Use - Daily. Medical And Surgical History Notes Gastrointestinal Colostomy Musculoskeletal R AKA Review of Systems (ROS) Constitutional Symptoms (General Health) Denies complaints or symptoms of Fever, Chills. Respiratory The patient has no complaints or symptoms. Cardiovascular The patient has no complaints or symptoms. Psychiatric The patient has no complaints or symptoms. Objective Constitutional Well-nourished and well-hydrated in no acute distress. Vitals Time Taken: 1:55 PM, Height: 70 in, Weight: 155 lbs, BMI: 22.2, Temperature: 98.3 F, Pulse: 93 bpm, Respiratory Rate: 16 breaths/min, Blood Pressure: 94/66 mmHg. Respiratory normal breathing without difficulty. clear to auscultation bilaterally. Cardiovascular regular rate and rhythm with normal S1, S2. Psychiatric this patient is able to make decisions and demonstrates good insight into disease process. Alert and Oriented x 3. pleasant and cooperative. General Notes: At this point patient's wound bed does seem to show signs of good granulation at all locations which is excellent news. He does not seem to have any evidence of infection which is also good news.  In general I'm very pleased with how things seem to be progressing. No debridement was required today. Integumentary (Hair, Skin) Wound #1 status is Open.  Original cause of wound was Pressure Injury. The wound is located on the Right Gluteus. The wound measures 2.5cm length x 1.4cm width x 0.1cm depth; 2.749cm^2 area and 0.275cm^3 volume. Wound #2 status is Open. Original cause of wound was Pressure Injury. The wound is located on the Left Gluteus. The wound measures 3cm length x 0.9cm width x 0.1cm depth; 2.121cm^2 area and 0.212cm^3 volume. [name], [name] ([id number]) Wound #3 status is Open. Original cause of wound was Pressure Injury. The wound is located on the Right Ischium. The wound measures 3.5cm length x 3.2cm width x 0.1cm depth; 8.796cm^2 area and 0.88cm^3 volume. Wound #4 status is Open. Original cause of wound was Pressure Injury. The wound is located on the Left Ischium. The wound measures 0.5cm length x 1.6cm width x 0.1cm depth; 0.628cm^2 area and 0.063cm^3 volume. Wound #5 status is Open. Original cause of wound was Pressure Injury. The wound is located on the Right Gluteal fold. The wound measures 3.1cm length x 1.3cm width x 0.1cm depth; 3.165cm^2 area and 0.317cm^3 volume. 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 Cleanse wound with mild soap and water May Shower, gently pat wound dry prior to applying new dressing. Wound #2 Left Gluteus: Clean wound with Normal Saline. Cleanse wound with mild soap and water May Shower, gently pat wound dry prior to applying new dressing. Wound #3 Right Ischium: Clean wound with Normal Saline. Cleanse wound  with mild soap and water May Shower, gently pat wound dry prior to applying new dressing. Wound #4 Left Ischium: Clean wound with Normal Saline. Cleanse wound with mild soap and water May Shower, gently pat wound dry prior to applying new dressing. Wound #5 Right Gluteal fold: Clean wound with Normal Saline. Cleanse wound with mild soap and water May Shower, gently pat wound dry prior to applying new dressing. Anesthetic (add to Medication List): Wound #1 Right Gluteus: Topical Lidocaine 4% cream applied to wound bed prior to debridement (In Clinic Only). Wound #2 Left Gluteus: [name], [name] ([id number]) Topical Lidocaine 4% cream applied to wound bed prior to debridement (In Clinic Only). Wound #3 Right Ischium: Topical Lidocaine 4% cream applied to wound bed prior to debridement (In Clinic Only). Wound #4 Left Ischium: Topical Lidocaine 4% cream applied to wound bed prior to debridement (In Clinic Only). Wound #5 Right Gluteal fold: Topical Lidocaine 4% cream applied to wound bed prior to debridement (In Clinic Only). Primary Wound Dressing: Wound #1 Right Gluteus: Hydrafera Blue Ready Transfer Wound #2 Left Gluteus: Hydrafera Blue Ready Transfer Wound #3 Right Ischium: Hydrafera Blue Ready Transfer Wound #4 Left Ischium: Hydrafera Blue Ready Transfer Wound #5 Right Gluteal fold: Hydrafera Blue Ready Transfer Secondary Dressing: Wound #1 Right Gluteus: Boardered Foam Dressing Wound #2 Left Gluteus: Boardered Foam Dressing Wound #3 Right Ischium: Boardered Foam Dressing Wound #4 Left Ischium: Boardered Foam Dressing Wound #5 Right Gluteal fold: Boardered Foam Dressing Dressing Change Frequency: Wound #1 Right Gluteus: Change Dressing Monday, Wednesday, Friday - and as needed Wound #2 Left Gluteus: Change Dressing Monday, Wednesday, Friday - and as needed Wound #3 Right Ischium: Change Dressing Monday, Wednesday, Friday - and as needed Wound #4 Left  Ischium: Change Dressing Monday, Wednesday, Friday - and as needed Wound #5 Right Gluteal  fold: Change Dressing Monday, Wednesday, Friday - and as needed Follow-up Appointments: Wound #1 Right Gluteus: Return Appointment in 2 weeks. Wound #2 Left Gluteus: Return Appointment in 2 weeks. Wound #3 Right Ischium: Return Appointment in 2 weeks. Wound #4 Left Ischium: Return Appointment in 2 weeks. Wound #5 Right Gluteal fold: Return Appointment in 2 weeks. Off-Loading: Wound #1 Right Gluteus: Roho cushion for wheelchair - ordered by [facility] - ordered by [facility] Turn and reposition every 2 hours Wound #2 Left Gluteus: Roho cushion for wheelchair - ordered by [facility] - ordered by [facility] Turn and reposition every 2 hours Haran, Leno ([id number]) Wound #3 Right Ischium: Roho cushion for wheelchair - ordered by [facility] - ordered by [facility] Turn and reposition every 2 hours Wound #4 Left Ischium: Roho cushion for wheelchair - ordered by [facility] [place] - ordered by [facility] Turn and reposition every 2 hours Wound #5 Right Gluteal fold: Roho cushion for wheelchair - ordered by [facility] - ordered by [facility] Turn and reposition every 2 hours Additional Orders / Instructions: Wound #1 Right Gluteus: Increase protein intake. Activity as tolerated Wound #2 Left Gluteus: Increase protein intake. Activity as tolerated Wound #3 Right Ischium: Increase protein intake. Activity as tolerated Wound #4 Left Ischium: Increase protein intake. Activity as tolerated Wound #5 Right Gluteal fold: Increase protein intake. Activity as tolerated Home Health: Wound #1 Right Gluteus: Continue Home Health Visits - [facility] Nurse may visit PRN to address patient s wound  care needs. FACE TO FACE ENCOUNTER: MEDICARE and MEDICAID PATIENTS: I certify that this patient is under my care and that I had a face-to-face encounter that meets the physician face-to-face encounter requirements with this patient on this date. The encounter with the patient was in whole or in part for the following MEDICAL CONDITION: (primary reason for Home Healthcare) MEDICAL NECESSITY: I certify, that based on my findings, NURSING services are a medically necessary home health service. HOME BOUND STATUS: I certify that my clinical findings support that this patient is homebound (i.e., Due to illness or injury, pt requires aid of supportive devices such as crutches, cane, wheelchairs, walkers, the use of special transportation or the assistance of another person to leave their place of residence. There is a normal inability to leave the home and doing so requires considerable and taxing effort. Other absences are for medical reasons / religious services and are infrequent or of short duration when for other reasons). If current dressing causes regression in wound condition, may D/C ordered dressing product/s and apply Normal Saline Moist Dressing daily until next Wound Healing Center / Other MD appointment. Notify Wound Healing Center of regression in wound condition at [phone]. Please direct any NON-WOUND related issues/requests for orders to patient's Primary Care Physician Wound #2 Left Gluteus: Continue Home Health Visits - [facility] Nurse may visit PRN to address patient s wound care needs. FACE TO FACE ENCOUNTER: MEDICARE and MEDICAID PATIENTS: I certify that this patient is under my care and that I had a face-to-face encounter that meets the physician face-to-face encounter requirements with this patient on this date. The encounter with the patient was in whole or in part for the following MEDICAL CONDITION: (primary reason for Home Healthcare) MEDICAL NECESSITY: I  certify, that based on my findings, NURSING services are a  medically necessary home health service. HOME BOUND STATUS: I certify that my clinical findings support that this patient is homebound (i.e., Due to illness or injury, pt requires aid of supportive devices such as crutches, cane, wheelchairs, walkers, the use of special transportation or the assistance of another person to leave their place of residence. There is a normal inability to leave the home and doing so requires considerable and taxing effort. Other absences are for medical reasons / religious services and are infrequent or of short duration when for other reasons). If current dressing causes regression in wound condition, may D/C ordered dressing product/s and apply Normal Saline Moist Dressing daily until next Wound Healing Center / Other MD appointment. Notify Wound Healing Center of regression in wound condition at [phone]. LEOBARDO, GRANLUND ([id number]) Please direct any NON-WOUND related issues/requests for orders to patient's Primary Care Physician Wound #3 Right Ischium: Continue Home Health Visits - [facility] Nurse may visit PRN to address patient s wound care needs. FACE TO FACE ENCOUNTER: MEDICARE and MEDICAID PATIENTS: I certify that this patient is under my care and that I had a face-to-face encounter that meets the physician face-to-face encounter requirements with this patient on this date. The encounter with the patient was in whole or in part for the following MEDICAL CONDITION: (primary reason for Home Healthcare) MEDICAL NECESSITY: I certify, that based on my findings, NURSING services are a medically necessary home health service. HOME BOUND STATUS: I certify that my clinical findings support that this patient is homebound (i.e., Due to illness or injury, pt requires aid of supportive devices such as crutches, cane, wheelchairs, walkers, the use of special transportation or the assistance of  another person to leave their place of residence. There is a normal inability to leave the home and doing so requires considerable and taxing effort. Other absences are for medical reasons / religious services and are infrequent or of short duration when for other reasons). If current dressing causes regression in wound condition, may D/C ordered dressing product/s and apply Normal Saline Moist Dressing daily until next Wound Healing Center / Other MD appointment. Notify Wound Healing Center of regression in wound condition at [phone]. Please direct any NON-WOUND related issues/requests for orders to patient's Primary Care Physician Wound #4 Left Ischium: Continue Home Health Visits - [facility] Nurse may visit PRN to address patient s wound care needs. FACE TO FACE ENCOUNTER: MEDICARE and MEDICAID PATIENTS: I certify that this patient is under my care and that I had a face-to-face encounter that meets the physician face-to-face encounter requirements with this patient on this date. The encounter with the patient was in whole or in part for the following MEDICAL CONDITION: (primary reason for Home Healthcare) MEDICAL NECESSITY: I certify, that based on my findings, NURSING services are a medically necessary home health service. HOME BOUND STATUS: I certify that my clinical findings support that this patient is homebound (i.e., Due to illness or injury, pt requires aid of supportive devices such as crutches, cane, wheelchairs, walkers, the use of special transportation or the assistance of another person to leave their place of residence. There is a normal inability to leave the home and doing so requires considerable and taxing effort. Other absences are for medical reasons / religious services and are infrequent or of short duration when for other reasons). If current dressing causes regression in wound condition, may D/C ordered dressing product/s and apply Normal Saline Moist  Dressing daily until next Wound Healing Center /  Other MD appointment. Notify Wound Healing Center of regression in wound condition at [phone]. Please direct any NON-WOUND related issues/requests for orders to patient's Primary Care Physician Wound #5 Right Gluteal fold: Continue Home Health Visits - [facility] Nurse may visit PRN to address patient s wound care needs. FACE TO FACE ENCOUNTER: MEDICARE and MEDICAID PATIENTS: I certify that this patient is under my care and that I had a face-to-face encounter that meets the physician face-to-face encounter requirements with this patient on this date. The encounter with the patient was in whole or in part for the following MEDICAL CONDITION: (primary reason for Home Healthcare) MEDICAL NECESSITY: I certify, that based on my findings, NURSING services are a medically necessary home health service. HOME BOUND STATUS: I certify that my clinical findings support that this patient is homebound (i.e., Due to illness or injury, pt requires aid of supportive devices such as crutches, cane, wheelchairs, walkers, the use of special transportation or the assistance of another person to leave their place of residence. There is a normal inability to leave the home and doing so requires considerable and taxing effort. Other absences are for medical reasons / religious services and are infrequent or of short duration when for other reasons). If current dressing causes regression in wound condition, may D/C ordered dressing product/s and apply Normal Saline Moist Dressing daily until next Wound Healing Center / Other MD appointment. Notify Wound Healing Center of regression in wound condition at [phone]. Please direct any NON-WOUND related issues/requests for orders to patient's Primary Care Physician The following medication(s) was prescribed: lidocaine topical 4 % cream 1 1 cream topical was prescribed at facility At this time I'm gonna  suggest that we continue with the current wound care measures for the next week. He is in agreement the plan since things seem to be doing well I recommended that he still try to stay off of this as much as possible. Subsequently we're gonna see were things stand at follow-up [name], [name] ([id number]) Please see above for specific wound care orders. We will see patient for re-evaluation in 2 week(s) here in the clinic. If anything worsens or changes patient will contact our office for additional recommendations. Electronic Signature(s) Signed: 01/24/2018 1:39:21 PM By: Lenda Kelp PA-C Entered By: Lenda Kelp on 01/24/2018 07:43:46 [name] ([id number]) -------------------------------------------------------------------------------- ROS/PFSH Details Patient Name: [name] Date of Service: 01/23/2018 1:45 PM Medical Record Number: [id number] Patient Account Number: [id number] Date of Birth/Sex: [birth year] (61 y.o. M) Treating RN: Phillis Haggis Primary Care Provider: Franco Nones Other Clinician: Referring Provider: Franco Nones Treating Provider/Extender: STONE III, HOYT Weeks in Treatment: 3 Information Obtained From Patient Wound History Do you currently have one or more open woundso Yes Approximately how long have you had your woundso 2 weeks How have you been treating your wound(s) until nowo Deuoderm Has your wound(s) ever healed and then re-openedo No Have you had any lab work done in the past montho No Have you tested positive for an antibiotic resistant organism (MRSA, VRE)o No Have you tested positive for osteomyelitis (bone infection)o No Have you had any tests for circulation on your legso No Constitutional Symptoms (General Health) Complaints and Symptoms: Negative for: Fever; Chills Eyes Medical History: Negative for: Cataracts; Glaucoma; Optic Neuritis Ear/Nose/Mouth/Throat Medical History: Positive for: Chronic sinus  problems/congestion Negative for: Middle ear problems Hematologic/Lymphatic Medical History: Positive for: Anemia Negative for: Hemophilia; Human Immunodeficiency Virus; Lymphedema; Sickle Cell Disease Respiratory Complaints and Symptoms: No Complaints or Symptoms  Medical History: Negative for: Aspiration; Asthma; Chronic Obstructive Pulmonary Disease (COPD); Pneumothorax; Sleep Apnea; Tuberculosis Cardiovascular Complaints and Symptoms: No Complaints or Symptoms Medical History: Positive for: Arrhythmia Tague, Constant ([id number]) Negative for: Angina; Congestive Heart Failure; Coronary Artery Disease; Deep Vein Thrombosis; Hypertension; Hypotension; Myocardial Infarction; Peripheral Arterial Disease; Peripheral Venous Disease; Phlebitis; Vasculitis Gastrointestinal Medical History: Positive for: Hepatitis C Negative for: Cirrhosis ; Colitis; Crohnos; Hepatitis A; Hepatitis B Past Medical History Notes: Colostomy Endocrine Medical History: Positive for: Type II Diabetes Time with diabetes: 1 year Treated with: Insulin Blood sugar tested every day: Yes Tested : Genitourinary Medical History: Negative for: End Stage Renal Disease Immunological Medical History: Negative for: Lupus Erythematosus; Raynaudos; Scleroderma Musculoskeletal Medical History: Past Medical History Notes: R AKA Neurologic Medical History: Negative for: Dementia; Neuropathy; Quadriplegia; Paraplegia; Seizure Disorder Psychiatric Complaints and Symptoms: No Complaints or Symptoms HBO Extended History Items Ear/Nose/Mouth/Throat: Chronic sinus problems/congestion Immunizations Pneumococcal Vaccine: Received Pneumococcal Vaccination: Yes Immunization Notes: up to date Implantable Devices Family and Social History MICHAELJAMES, MILNES ([id number]) Cancer: No; Diabetes: No; Heart Disease: Yes - Siblings; Hereditary Spherocytosis: No; Hypertension: Yes - Siblings; Kidney Disease: No; Lung Disease:  Yes - Siblings; Seizures: No; Stroke: No; Thyroid Problems: No; Tuberculosis: No; Current every day smoker; Marital Status - Single; Alcohol Use: Never - history of ETOH abuse; Drug Use: Prior History; Caffeine Use: Daily; Financial Concerns: No; Food, Clothing or Shelter Needs: No; Support System Lacking: No; Transportation Concerns: No; Advanced Directives: No; Patient does not want information on Advanced Directives Physician Affirmation I have reviewed and agree with the above information. Electronic Signature(s) Signed: 01/24/2018 1:39:21 PM By: Lenda Kelp PA-C Signed: 01/24/2018 5:07:56 PM By: Alejandro Mulling Entered By: Lenda Kelp on 01/24/2018 07:42:27 [name] ([id number]) -------------------------------------------------------------------------------- SuperBill Details Patient Name: [name] Date of Service: 01/23/2018 Medical Record Number: [id number] Patient Account Number: [id number] Date of Birth/Sex: 1955/10/21 (61 y.o. M) Treating RN: Phillis Haggis Primary Care Provider: Franco Nones Other Clinician: Referring Provider: Franco Nones Treating Provider/Extender: STONE III, HOYT Weeks in Treatment: 3 Diagnosis Coding ICD-10 Codes Code Description L89.313 Pressure ulcer of right buttock, stage 3 L89.323 Pressure ulcer of left buttock, stage 3 Z99.3 Dependence on wheelchair Z93.3 Colostomy status Z89.611 Acquired absence of right leg above knee F17.210 Nicotine dependence, cigarettes, uncomplicated K70.30 Alcoholic cirrhosis of liver without ascites Facility Procedures CPT4 Code: 62952841 Description: 32440 - WOUND CARE VISIT-LEV 5 EST PT Modifier: Quantity: 1 Physician Procedures CPT4 Code: [id number] Description: 99213 - WC PHYS LEVEL 3 - EST PT ICD-10 Diagnosis Description L89.313 Pressure ulcer of right buttock, stage 3 L89.323 Pressure ulcer of left buttock, stage 3 Z99.3 Dependence on wheelchair Z93.3 Colostomy  status Modifier: Quantity: 1 Electronic Signature(s) Signed: 01/24/2018 1:39:21 PM By: Lenda Kelp PA-C Entered By: Lenda Kelp on 01/24/2018 07:44:30

## 2018-01-25 NOTE — Progress Notes (Signed)
DENARD, TUMINELLO ([id number]) Visit Report for 01/23/2018 Arrival Information Details Patient Name: [name] Date of Service: 01/23/2018 1:45 PM Medical Record Number: [id number] Patient Account Number: [id number] Date of Birth/Sex: 1955/12/21 (61 y.o. M) Treating RN: [name] Primary Care [name] Other Clinician: Referring [name] Treating [name], [name]: 3 Visit Information History Since Last Visit Added or deleted any medications: No Patient Arrived: Wheel Chair Any new allergies or adverse reactions: No Arrival Time: 13:53 Had a fall or experienced change in No Accompanied By: staff activities of daily living that may affect Transfer Assistance: Manual risk of falls: Patient Identification Verified: Yes Signs or symptoms of abuse/neglect since last visito No Secondary Verification Process Completed: Yes Hospitalized since last visit: No Implantable device outside of the clinic excluding No cellular tissue based products placed in the center since last visit: Has Dressing in Place as Prescribed: Yes Pain Present Now: No Electronic Signature(s) Signed: 01/23/2018 5:46:04 PM By: [name] Entered By: [name] on 01/23/2018 13:53:39 [name] ([id number]) -------------------------------------------------------------------------------- Clinic Level of Care Assessment Details Patient Name: [name] Date of Service: 01/23/2018 1:45 PM Medical Record Number: [id number] Patient Account Number: [id number] Date of Birth/Sex: 1955-12-30 (61 y.o. M) Treating RN: [name] Primary Care [name] Other Clinician: Referring [name] Treating [name] Weeks in Higgins: 3 Clinic Level of Care Assessment Items TOOL 4 Quantity Score X - Use when only an EandM is performed on FOLLOW-UP visit 1 0 ASSESSMENTS - Nursing  Assessment / Reassessment X - Reassessment of Co-morbidities (includes updates in patient status) 1 10 X- 1 5 Reassessment of Adherence to Higgins Plan ASSESSMENTS - Wound and Skin Assessment / Reassessment []  - Simple Wound Assessment / Reassessment - one wound 0 X- 5 5 Complex Wound Assessment / Reassessment - multiple wounds []  - 0 Dermatologic / Skin Assessment (not related to wound area) ASSESSMENTS - Focused Assessment []  - Circumferential Edema Measurements - multi extremities 0 []  - 0 Nutritional Assessment / Counseling / Intervention []  - 0 Lower Extremity Assessment (monofilament, tuning fork, pulses) []  - 0 Peripheral Arterial Disease Assessment (using hand held doppler) ASSESSMENTS - Ostomy and/or Continence Assessment and Care []  - Incontinence Assessment and Management 0 []  - 0 Ostomy Care Assessment and Management (repouching, etc.) PROCESS - Coordination of Care []  - Simple Patient / Family Education for ongoing care 0 X- 1 20 Complex (extensive) Patient / Family Education for ongoing care X- 1 10 Staff obtains Chiropractor, Records, Test Results / Process Orders X- 1 10 Staff telephones HHA, Nursing Homes / Clarify orders / etc []  - 0 Routine Transfer to another Facility (non-emergent condition) []  - 0 Routine Hospital Admission (non-emergent condition) []  - 0 New Admissions / Manufacturing engineer / Ordering NPWT, Apligraf, etc. []  - 0 Emergency Hospital Admission (emergent condition) X- 1 10 Simple Discharge Coordination [name] ([id number]) []  - 0 Complex (extensive) Discharge Coordination PROCESS - Special Needs []  - Pediatric / Minor Patient Management 0 []  - 0 Isolation Patient Management []  - 0 Hearing / Language / Visual special needs []  - 0 Assessment of Community assistance (transportation, D/C planning, etc.) []  - 0 Additional assistance / Altered mentation []  - 0 Support Surface(s) Assessment (bed, cushion, seat,  etc.) INTERVENTIONS - Wound Cleansing / Measurement []  - Simple Wound Cleansing - one wound 0 X- 5 5 Complex Wound Cleansing - multiple wounds X- 1 5 Wound Imaging (photographs - any number of wounds) []  -  0 Wound Tracing (instead of photographs) []  - 0 Simple Wound Measurement - one wound X- 5 5 Complex Wound Measurement - multiple wounds INTERVENTIONS - Wound Dressings X - Small Wound Dressing one or multiple wounds 5 10 []  - 0 Medium Wound Dressing one or multiple wounds []  - 0 Large Wound Dressing one or multiple wounds X- 1 5 Application of Medications - topical []  - 0 Application of Medications - injection INTERVENTIONS - Miscellaneous []  - External ear exam 0 []  - 0 Specimen Collection (cultures, biopsies, blood, body fluids, etc.) []  - 0 Specimen(s) / Culture(s) sent or taken to Lab for analysis X- 1 10 Patient Transfer (multiple staff / Michiel Higgins Lift / Similar devices) []  - 0 Simple Staple / Suture removal (25 or less) []  - 0 Complex Staple / Suture removal (26 or more) []  - 0 Hypo / Hyperglycemic Management (close monitor of Blood Glucose) []  - 0 Ankle / Brachial Index (ABI) - do not check if billed separately X- 1 5 Vital Signs [name] ([id number]) Has the patient been seen at the hospital within the last three years: Yes Total Score: 215 Level Of Care: New/Established - Level 5 Electronic Signature(s) Signed: 01/24/2018 5:07:56 PM By: [name] Entered By: [name] on 01/23/2018 17:32:42 [name] ([id number]) -------------------------------------------------------------------------------- Encounter Discharge Information Details Patient Name: [name] Date of Service: 01/23/2018 1:45 PM Medical Record Number: [id number] Patient Account Number: [id number] Date of Birth/Sex: [birth year] (61 y.o. M) Treating RN: [name] Primary Care [name] Other Clinician: Referring [name] Treating [name] Weeks in Higgins: 3 Encounter Discharge Information Items Discharge Condition: Stable Ambulatory Status: Wheelchair Discharge Destination: Home Transportation: Private Auto Schedule Follow-up Appointment: Yes Clinical Summary of Care: Electronic Signature(s) Signed: 01/23/2018 4:58:15 PM By: [name] Entered By: [name] on 01/23/2018 14:57:27 [name] ([id number]) -------------------------------------------------------------------------------- Lower Extremity Assessment Details Patient Name: [name] Date of Service: 01/23/2018 1:45 PM Medical Record Number: [id number] Patient Account Number: [id number] Date of Birth/Sex: 21-Jun-1956 (61 y.o. M) Treating RN: [name] Primary Care Terrina Docter: Franco Higgins Other Clinician: Referring Beza Steppe: Franco Higgins Treating Kash Davie/Extender: [name], [name]: 3 Electronic Signature(s) Signed: 01/23/2018 5:46:04 PM By: [name] Entered By: [name] on 01/23/2018 13:55:53 [name] ([id number]) -------------------------------------------------------------------------------- Multi Wound Chart Details Patient Name: [name] Date of Service: 01/23/2018 1:45 PM Medical Record Number: [id number] Patient Account Number: [id number] Date of Birth/Sex: 09/26/1955 (61 y.o. M) Treating RN: [name] Primary Care Gid Schoffstall: Franco Higgins Other Clinician: Referring Elener Custodio: Franco Higgins Treating Marabeth Melland/Extender: Shawn III, [name]: 3 Vital Signs Height(in): 70 Pulse(bpm): 93 Weight(lbs): 155 Blood Pressure(mmHg): 94/66 Body Mass Index(BMI): 22 Temperature(F): 98.3 Respiratory Rate 16 (breaths/min): Photos: Wound Location: Right Gluteus Left Gluteus Right Ischium Wounding Event: Pressure Injury Pressure Injury Pressure Injury Primary Etiology: Pressure Ulcer Pressure Ulcer Pressure  Ulcer Date Acquired: 12/11/2017 12/15/2017 12/15/2017 Weeks of Higgins: 3 3 3  Wound Status: Open Open Open Clustered Wound: No No No Measurements L x W x D 2.5x1.4x0.1 3x0.9x0.1 3.5x3.2x0.1 (cm) Area (cm) : 2.749 2.121 8.796 Volume (cm) : 0.275 0.212 0.88 % Reduction in Area: 16.70% 55.00% -16.70% % Reduction in Volume: 16.70% 55.00% -16.70% Classification: Category/Stage III Category/Stage III Category/Stage III Periwound Skin Texture: No Abnormalities Noted No Abnormalities Noted No Abnormalities Noted Periwound Skin Moisture: No Abnormalities Noted No Abnormalities Noted No Abnormalities Noted Periwound Skin Color: No Abnormalities Noted No Abnormalities Noted No Abnormalities Noted Tenderness on Palpation: No No No Wound  Number: 4 5 N/A Photos: N/A Wound Location: Left Ischium Right Gluteal fold N/A Fellenz, Nyquan ([id number]) Wounding Event: Pressure Injury Pressure Injury N/A Primary Etiology: Pressure Ulcer Pressure Ulcer N/A Date Acquired: 12/15/2017 12/15/2017 N/A Weeks of Higgins: 3 3 N/A Wound Status: Open Open N/A Clustered Wound: Yes No N/A Measurements L x W x D 0.5x1.6x0.1 3.1x1.3x0.1 N/A (cm) Area (cm) : 0.628 3.165 N/A Volume (cm) : 0.063 0.317 N/A % Reduction in Area: 87.20% 74.80% N/A % Reduction in Volume: 87.20% 74.80% N/A Classification: Category/Stage III Category/Stage III N/A Periwound Skin Texture: No Abnormalities Noted No Abnormalities Noted N/A Periwound Skin Moisture: No Abnormalities Noted No Abnormalities Noted N/A Periwound Skin Color: No Abnormalities Noted No Abnormalities Noted N/A Tenderness on Palpation: No No N/A Higgins Notes Electronic Signature(s) Signed: 01/24/2018 5:07:56 PM By: Shawn MullingPinkerton, Debra Entered By: Shawn MullingPinkerton, Debra on 01/23/2018 14:45:00 [name], [name] ([id number]) -------------------------------------------------------------------------------- Multi-Disciplinary Care Plan Details Patient Name: [name], [name] Date  of Service: 01/23/2018 1:45 PM Medical Record Number: [id number] Patient Account Number: [id number] Date of Birth/Sex: 06-Jan-1956 (61 y.o. M) Treating RN: Shawn HaggisPinkerton, Debi Primary Care Ellina Sivertsen: Franco NonesLINDLEY, CHERYL Other Clinician: Referring Tylisa Alcivar: Franco NonesLINDLEY, CHERYL Treating Fulton Merry/Extender: Shawn III, [name]: 3 Active Inactive ` Abuse / Safety / Falls / Self Care Management Nursing Diagnoses: Impaired physical mobility Goals: Patient will not develop complications from immobility Date Initiated: 01/02/2018 Target Resolution Date: 02/16/2018 Goal Status: Active Interventions: Assess fall risk on admission and as needed Notes: ` Nutrition Nursing Diagnoses: Potential for alteratiion in Nutrition/Potential for imbalanced nutrition Goals: Patient/caregiver agrees to and verbalizes understanding of need to use nutritional supplements and/or vitamins as prescribed Date Initiated: 01/02/2018 Target Resolution Date: 03/17/2018 Goal Status: Active Interventions: Provide education on nutrition Notes: ` Orientation to the Wound Care Program Nursing Diagnoses: Knowledge deficit related to the wound healing center program Goals: Patient/caregiver will verbalize understanding of the Wound Healing Center Program Date Initiated: 01/02/2018 Target Resolution Date: 02/16/2018 Goal Status: Active Interventions: Shawn SchlichterRANDLES, [name] ([id number]) Provide education on orientation to the wound center Notes: ` Pressure Nursing Diagnoses: Potential for impaired tissue integrity related to pressure, friction, moisture, and shear Goals: Patient will remain free of pressure ulcers Date Initiated: 01/02/2018 Target Resolution Date: 03/17/2018 Goal Status: Active Interventions: Assess potential for pressure ulcer upon admission and as needed Notes: ` Wound/Skin Impairment Nursing Diagnoses: Impaired tissue integrity Goals: Ulcer/skin breakdown will heal within 14 weeks Date Initiated:  01/02/2018 Target Resolution Date: 03/16/2018 Goal Status: Active Interventions: Assess patient/caregiver ability to obtain necessary supplies Assess patient/caregiver ability to perform ulcer/skin care regimen upon admission and as needed Assess ulceration(s) every visit Notes: Electronic Signature(s) Signed: 01/24/2018 5:07:56 PM By: Shawn MullingPinkerton, Debra Entered By: Shawn MullingPinkerton, Debra on 01/23/2018 14:44:49 [name], [name] ([id number]) -------------------------------------------------------------------------------- Pain Assessment Details Patient Name: [name], [name] Date of Service: 01/23/2018 1:45 PM Medical Record Number: [id number] Patient Account Number: [id number] Date of Birth/Sex: 06-Jan-1956 (61 y.o. M) Treating RN: Shawn Sitesorthy, Joanna Primary Care Belissa Kooy: Franco NonesLINDLEY, CHERYL Other Clinician: Referring Marella Vanderpol: Franco NonesLINDLEY, CHERYL Treating Luisdaniel Kenton/Extender: Shawn III, [name]: 3 Active Problems Location of Pain Severity and Description of Pain Patient Has Paino No Site Locations Pain Management and Medication Current Pain Management: Electronic Signature(s) Signed: 01/23/2018 5:46:04 PM By: Shawn Sitesorthy, Joanna Entered By: Shawn Sitesorthy, Joanna on 01/23/2018 13:55:18 [name], [name] ([id number]) -------------------------------------------------------------------------------- Patient/Caregiver Education Details Patient Name: [name], [name] Date of Service: 01/23/2018 1:45 PM Medical Record Number: [id number] Patient Account Number: [id number] Date of Birth/Gender: 06-Jan-1956 (61 y.o. M) Treating RN: Shawn CriglerFlinchum, Cheryl Primary Care Physician: Franco NonesLINDLEY, CHERYL Other  Clinician: Referring Physician: Franco Higgins Treating Physician/Extender: Skeet Simmer in Higgins: 3 Education Assessment Education Provided To: Patient Education Topics Provided Wound Debridement: Methods: Explain/Verbal Responses: State content correctly Wound/Skin Impairment: Handouts: Caring for Your  Ulcer Methods: Explain/Verbal Responses: State content correctly Electronic Signature(s) Signed: 01/23/2018 4:58:15 PM By: [name] Entered By: [name] on 01/23/2018 14:57:45 [name] ([id number]) -------------------------------------------------------------------------------- Wound Assessment Details Patient Name: [name] Date of Service: 01/23/2018 1:45 PM Medical Record Number: [id number] Patient Account Number: [id number] Date of Birth/Sex: January 01, 1956 (61 y.o. M) Treating RN: [name] Primary Care Brownie Nehme: Franco Higgins Other Clinician: Referring Jaycob Mcclenton: Franco Higgins Treating Domonik Levario/Extender: Shawn III, [name]: 3 Wound Status Wound Number: 1 Primary Etiology: Pressure Ulcer Wound Location: Right Gluteus Wound Status: Open Wounding Event: Pressure Injury Date Acquired: 12/11/2017 Weeks Of Higgins: 3 Clustered Wound: No Photos Photo Uploaded By: [name] on 01/23/2018 14:08:05 Wound Measurements Length: (cm) 2.5 Width: (cm) 1.4 Depth: (cm) 0.1 Area: (cm) 2.749 Volume: (cm) 0.275 % Reduction in Area: 16.7% % Reduction in Volume: 16.7% Wound Description Classification: Category/Stage III Periwound Skin Texture Texture Color No Abnormalities Noted: No No Abnormalities Noted: No Moisture No Abnormalities Noted: No Higgins Notes Wound #1 (Right Gluteus) 1. Cleansed with: Clean wound with Normal Saline 2. Anesthetic Topical Lidocaine 4% cream to wound bed prior to debridement 4. Dressing Applied: KALADIN, NOSEWORTHY ([id number]) Hydrafera Blue 5. Secondary Dressing Applied Bordered Foam Dressing Electronic Signature(s) Signed: 01/23/2018 5:46:04 PM By: [name] Entered By: [name] on 01/23/2018 14:03:34 [name] ([id number]) -------------------------------------------------------------------------------- Wound Assessment Details Patient Name: [name] Date of Service:  01/23/2018 1:45 PM Medical Record Number: [id number] Patient Account Number: [id number] Date of Birth/Sex: 1956-05-31 (61 y.o. M) Treating RN: [name] Primary Care Diara Chaudhari: Franco Higgins Other Clinician: Referring Guilford Shannahan: Franco Higgins Treating Jaegar Croft/Extender: Shawn III, [name]: 3 Wound Status Wound Number: 2 Primary Etiology: Pressure Ulcer Wound Location: Left Gluteus Wound Status: Open Wounding Event: Pressure Injury Date Acquired: 12/15/2017 Weeks Of Higgins: 3 Clustered Wound: No Photos Photo Uploaded By: [name] on 01/23/2018 14:07:30 Wound Measurements Length: (cm) 3 Width: (cm) 0.9 Depth: (cm) 0.1 Area: (cm) 2.121 Volume: (cm) 0.212 % Reduction in Area: 55% % Reduction in Volume: 55% Wound Description Classification: Category/Stage III Periwound Skin Texture Texture Color No Abnormalities Noted: No No Abnormalities Noted: No Moisture No Abnormalities Noted: No Higgins Notes Wound #2 (Left Gluteus) 1. Cleansed with: Clean wound with Normal Saline 2. Anesthetic Topical Lidocaine 4% cream to wound bed prior to debridement 4. Dressing Applied: DASHIELL, FRANCHINO ([id number]) Hydrafera Blue 5. Secondary Dressing Applied Bordered Foam Dressing Electronic Signature(s) Signed: 01/23/2018 5:46:04 PM By: [name] Entered By: [name] on 01/23/2018 14:03:34 [name] ([id number]) -------------------------------------------------------------------------------- Wound Assessment Details Patient Name: [name] Date of Service: 01/23/2018 1:45 PM Medical Record Number: [id number] Patient Account Number: [id number] Date of Birth/Sex: 12-28-1955 (61 y.o. M) Treating RN: [name] Primary Care Khyler Urda: Franco Higgins Other Clinician: Referring Jameriah Trotti: Franco Higgins Treating Lodie Waheed/Extender: Shawn III, [name]: 3 Wound Status Wound Number: 3 Primary Etiology: Pressure  Ulcer Wound Location: Right Ischium Wound Status: Open Wounding Event: Pressure Injury Date Acquired: 12/15/2017 Weeks Of Higgins: 3 Clustered Wound: No Photos Photo Uploaded By: [name] on 01/23/2018 14:07:31 Wound Measurements Length: (cm) 3.5 Width: (cm) 3.2 Depth: (cm) 0.1 Area: (cm) 8.796 Volume: (cm) 0.88 % Reduction in Area: -16.7% % Reduction in Volume: -16.7% Wound Description Classification: Category/Stage III Periwound Skin Texture Texture Color No  Abnormalities Noted: No No Abnormalities Noted: No Moisture No Abnormalities Noted: No Higgins Notes Wound #3 (Right Ischium) 1. Cleansed with: Clean wound with Normal Saline 2. Anesthetic Topical Lidocaine 4% cream to wound bed prior to debridement 4. Dressing Applied: EMERIL, STILLE ([id number]) Hydrafera Blue 5. Secondary Dressing Applied Bordered Foam Dressing Electronic Signature(s) Signed: 01/23/2018 5:46:04 PM By: [name] Entered By: [name] on 01/23/2018 14:03:34 [name] ([id number]) -------------------------------------------------------------------------------- Wound Assessment Details Patient Name: [name] Date of Service: 01/23/2018 1:45 PM Medical Record Number: [id number] Patient Account Number: [id number] Date of Birth/Sex: [birth year] (61 y.o. M) Treating RN: [name] Primary Care Tywone Bembenek: Franco Higgins Other Clinician: Referring Aadan Chenier: Franco Higgins Treating Imri Lor/Extender: Shawn III, [name]: 3 Wound Status Wound Number: 4 Primary Etiology: Pressure Ulcer Wound Location: Left Ischium Wound Status: Open Wounding Event: Pressure Injury Date Acquired: 12/15/2017 Weeks Of Higgins: 3 Clustered Wound: Yes Photos Photo Uploaded By: [name] on 01/23/2018 14:07:50 Wound Measurements Length: (cm) 0.5 Width: (cm) 1.6 Depth: (cm) 0.1 Area: (cm) 0.628 Volume: (cm) 0.063 % Reduction in Area: 87.2% % Reduction  in Volume: 87.2% Wound Description Classification: Category/Stage III Periwound Skin Texture Texture Color No Abnormalities Noted: No No Abnormalities Noted: No Moisture No Abnormalities Noted: No Higgins Notes Wound #4 (Left Ischium) 1. Cleansed with: Clean wound with Normal Saline 2. Anesthetic Topical Lidocaine 4% cream to wound bed prior to debridement 4. Dressing Applied: HILL, MACKIE ([id number]) Hydrafera Blue 5. Secondary Dressing Applied Bordered Foam Dressing Electronic Signature(s) Signed: 01/23/2018 5:46:04 PM By: [name] Entered By: [name] on 01/23/2018 14:03:35 [name] ([id number]) -------------------------------------------------------------------------------- Wound Assessment Details Patient Name: [name] Date of Service: 01/23/2018 1:45 PM Medical Record Number: [id number] Patient Account Number: [id number] Date of Birth/Sex: [birth year] (61 y.o. M) Treating RN: [name] Primary Care Rhodie Cienfuegos: Franco Higgins Other Clinician: Referring Dierdre Mccalip: Franco Higgins Treating Michelyn Scullin/Extender: Shawn III, [name]: 3 Wound Status Wound Number: 5 Primary Etiology: Pressure Ulcer Wound Location: Right Gluteal fold Wound Status: Open Wounding Event: Pressure Injury Date Acquired: 12/15/2017 Weeks Of Higgins: 3 Clustered Wound: No Photos Photo Uploaded By: [name] on 01/23/2018 14:07:51 Wound Measurements Length: (cm) 3.1 Width: (cm) 1.3 Depth: (cm) 0.1 Area: (cm) 3.165 Volume: (cm) 0.317 % Reduction in Area: 74.8% % Reduction in Volume: 74.8% Wound Description Classification: Category/Stage III Periwound Skin Texture Texture Color No Abnormalities Noted: No No Abnormalities Noted: No Moisture No Abnormalities Noted: No Higgins Notes Wound #5 (Right Gluteal fold) 1. Cleansed with: Clean wound with Normal Saline 2. Anesthetic Topical Lidocaine 4% cream to wound bed prior to  debridement 4. Dressing Applied: AINSLEY, SANGUINETTI ([id number]) Hydrafera Blue 5. Secondary Dressing Applied Bordered Foam Dressing Electronic Signature(s) Signed: 01/23/2018 5:46:04 PM By: [name] Entered By: [name] on 01/23/2018 14:03:35 [name] ([id number]) -------------------------------------------------------------------------------- Vitals Details Patient Name: [name] Date of Service: 01/23/2018 1:45 PM Medical Record Number: [id number] Patient Account Number: [id number] Date of Birth/Sex: [birth year] (61 y.o. M) Treating RN: [name] Primary Care Sparrow [place]: Franco Higgins Other Clinician: Referring Hulbert Branscome: Franco Higgins Treating Irbin Fines/Extender: Shawn III, [name]: 3 Vital Signs Time Taken: 13:55 Temperature (F): 98.3 Height (in): 70 Pulse (bpm): 93 Weight (lbs): 155 Respiratory Rate (breaths/min): 16 Body Mass Index (BMI): 22.2 Blood Pressure (mmHg): 94/66 Reference Range: 80 - 120 mg / dl Electronic Signature(s) Signed: 01/23/2018 5:46:04 PM By: [name] Entered By: [name] on 01/23/2018 13:55:41

## 2018-02-06 ENCOUNTER — Encounter: Payer: Medicaid Other | Admitting: Physician Assistant

## 2018-02-06 DIAGNOSIS — L89313 Pressure ulcer of right buttock, stage 3: Secondary | ICD-10-CM | POA: Diagnosis not present

## 2018-02-09 ENCOUNTER — Inpatient Hospital Stay
Admission: EM | Admit: 2018-02-09 | Discharge: 2018-02-13 | DRG: 871 | Disposition: A | Discharge status: Home Health | Payer: Medicaid Other | Attending: Internal Medicine | Admitting: Internal Medicine

## 2018-02-09 ENCOUNTER — Inpatient Hospital Stay: Payer: Self-pay

## 2018-02-09 ENCOUNTER — Other Ambulatory Visit: Payer: Self-pay

## 2018-02-09 ENCOUNTER — Emergency Department: Payer: Medicaid Other

## 2018-02-09 DIAGNOSIS — D649 Anemia, unspecified: Secondary | ICD-10-CM | POA: Diagnosis not present

## 2018-02-09 DIAGNOSIS — R402250 Coma scale, best verbal response, oriented, unspecified time: Secondary | ICD-10-CM | POA: Diagnosis present

## 2018-02-09 DIAGNOSIS — R195 Other fecal abnormalities: Secondary | ICD-10-CM | POA: Diagnosis present

## 2018-02-09 DIAGNOSIS — A419 Sepsis, unspecified organism: Secondary | ICD-10-CM | POA: Diagnosis present

## 2018-02-09 DIAGNOSIS — N390 Urinary tract infection, site not specified: Secondary | ICD-10-CM | POA: Diagnosis present

## 2018-02-09 DIAGNOSIS — D500 Iron deficiency anemia secondary to blood loss (chronic): Secondary | ICD-10-CM | POA: Diagnosis present

## 2018-02-09 DIAGNOSIS — R402360 Coma scale, best motor response, obeys commands, unspecified time: Secondary | ICD-10-CM | POA: Diagnosis present

## 2018-02-09 DIAGNOSIS — R402140 Coma scale, eyes open, spontaneous, unspecified time: Secondary | ICD-10-CM | POA: Diagnosis present

## 2018-02-09 DIAGNOSIS — R0902 Hypoxemia: Secondary | ICD-10-CM | POA: Diagnosis present

## 2018-02-09 DIAGNOSIS — K922 Gastrointestinal hemorrhage, unspecified: Secondary | ICD-10-CM | POA: Diagnosis present

## 2018-02-09 DIAGNOSIS — Z89611 Acquired absence of right leg above knee: Secondary | ICD-10-CM

## 2018-02-09 DIAGNOSIS — E1151 Type 2 diabetes mellitus with diabetic peripheral angiopathy without gangrene: Secondary | ICD-10-CM | POA: Diagnosis present

## 2018-02-09 DIAGNOSIS — Z8719 Personal history of other diseases of the digestive system: Secondary | ICD-10-CM

## 2018-02-09 DIAGNOSIS — Z880 Allergy status to penicillin: Secondary | ICD-10-CM | POA: Diagnosis not present

## 2018-02-09 DIAGNOSIS — D72819 Decreased white blood cell count, unspecified: Secondary | ICD-10-CM | POA: Diagnosis present

## 2018-02-09 DIAGNOSIS — L89152 Pressure ulcer of sacral region, stage 2: Secondary | ICD-10-CM | POA: Diagnosis present

## 2018-02-09 DIAGNOSIS — E871 Hypo-osmolality and hyponatremia: Secondary | ICD-10-CM | POA: Diagnosis present

## 2018-02-09 DIAGNOSIS — R6521 Severe sepsis with septic shock: Secondary | ICD-10-CM | POA: Diagnosis present

## 2018-02-09 DIAGNOSIS — Z1623 Resistance to quinolones and fluoroquinolones: Secondary | ICD-10-CM | POA: Diagnosis present

## 2018-02-09 DIAGNOSIS — I119 Hypertensive heart disease without heart failure: Secondary | ICD-10-CM | POA: Diagnosis present

## 2018-02-09 DIAGNOSIS — A4151 Sepsis due to Escherichia coli [E. coli]: Principal | ICD-10-CM | POA: Diagnosis present

## 2018-02-09 DIAGNOSIS — Z681 Body mass index (BMI) 19 or less, adult: Secondary | ICD-10-CM | POA: Diagnosis not present

## 2018-02-09 DIAGNOSIS — E876 Hypokalemia: Secondary | ICD-10-CM | POA: Diagnosis present

## 2018-02-09 DIAGNOSIS — Z933 Colostomy status: Secondary | ICD-10-CM

## 2018-02-09 DIAGNOSIS — Z79899 Other long term (current) drug therapy: Secondary | ICD-10-CM

## 2018-02-09 DIAGNOSIS — E43 Unspecified severe protein-calorie malnutrition: Secondary | ICD-10-CM | POA: Diagnosis present

## 2018-02-09 DIAGNOSIS — Z1612 Extended spectrum beta lactamase (ESBL) resistance: Secondary | ICD-10-CM | POA: Diagnosis present

## 2018-02-09 DIAGNOSIS — F17210 Nicotine dependence, cigarettes, uncomplicated: Secondary | ICD-10-CM | POA: Diagnosis present

## 2018-02-09 DIAGNOSIS — Z881 Allergy status to other antibiotic agents status: Secondary | ICD-10-CM

## 2018-02-09 DIAGNOSIS — L8990 Pressure ulcer of unspecified site, unspecified stage: Secondary | ICD-10-CM | POA: Diagnosis present

## 2018-02-09 LAB — COMPREHENSIVE METABOLIC PANEL
ALBUMIN: 2 g/dL — AB (ref 3.5–5.0)
ALK PHOS: 137 U/L — AB (ref 38–126)
ALT: 9 U/L (ref 0–44)
AST: 24 U/L (ref 15–41)
Anion gap: 6 (ref 5–15)
BILIRUBIN TOTAL: 0.7 mg/dL (ref 0.3–1.2)
BUN: 16 mg/dL (ref 8–23)
CO2: 22 mmol/L (ref 22–32)
Calcium: 7.6 mg/dL — ABNORMAL LOW (ref 8.9–10.3)
Chloride: 104 mmol/L (ref 98–111)
Creatinine, Ser: 1 mg/dL (ref 0.61–1.24)
GFR calc Af Amer: >60 mL/min (ref >60)
GFR calc non Af Amer: >60 mL/min (ref >60)
GLUCOSE: 112 mg/dL — AB (ref 70–99)
POTASSIUM: 4.4 mmol/L (ref 3.5–5.1)
Sodium: 132 mmol/L — ABNORMAL LOW (ref 135–145)
TOTAL PROTEIN: 6.2 g/dL — AB (ref 6.5–8.1)

## 2018-02-09 LAB — CBC WITH DIFFERENTIAL/PLATELET
Basophils Absolute: 0 10*3/uL (ref 0–0.1)
Basophils Relative: 0 %
EOS ABS: 0.1 10*3/uL (ref 0–0.7)
Eosinophils Relative: 2 %
HEMATOCRIT: 23.8 % — AB (ref 40.0–52.0)
HEMOGLOBIN: 8.1 g/dL — AB (ref 13.0–18.0)
LYMPHS ABS: 0.5 10*3/uL — AB (ref 1.0–3.6)
Lymphocytes Relative: 10 %
MCH: 29.4 pg (ref 26.0–34.0)
MCHC: 34.1 g/dL (ref 32.0–36.0)
MCV: 86.1 fL (ref 80.0–100.0)
MONO ABS: 0.4 10*3/uL (ref 0.2–1.0)
MONOS PCT: 7 %
NEUTROS PCT: 81 %
Neutro Abs: 4.3 10*3/uL (ref 1.4–6.5)
Platelets: 194 10*3/uL (ref 150–440)
RBC: 2.76 MIL/uL — ABNORMAL LOW (ref 4.40–5.90)
RDW: 16.4 % — AB (ref 11.5–14.5)
WBC: 5.3 10*3/uL (ref 3.8–10.6)

## 2018-02-09 LAB — TROPONIN I: Troponin I: <0.03 ng/mL (ref <0.03)

## 2018-02-09 LAB — URINALYSIS, COMPLETE (UACMP) WITH MICROSCOPIC
RBC / HPF: >50 RBC/hpf — ABNORMAL HIGH (ref 0–5)
Specific Gravity, Urine: 1.015 (ref 1.005–1.030)
Squamous Epithelial / LPF: NONE SEEN (ref 0–5)

## 2018-02-09 LAB — GLUCOSE, CAPILLARY
Glucose-Capillary: 138 mg/dL — ABNORMAL HIGH (ref 70–99)
Glucose-Capillary: 158 mg/dL — ABNORMAL HIGH (ref 70–99)

## 2018-02-09 LAB — BRAIN NATRIURETIC PEPTIDE: B Natriuretic Peptide: 109 pg/mL — ABNORMAL HIGH (ref 0.0–100.0)

## 2018-02-09 LAB — HEMOGLOBIN A1C
Hgb A1c MFr Bld: 5.3 % (ref 4.8–5.6)
MEAN PLASMA GLUCOSE: 105.41 mg/dL

## 2018-02-09 LAB — PROCALCITONIN: Procalcitonin: <0.1 ng/mL

## 2018-02-09 LAB — MRSA PCR SCREENING: MRSA by PCR: NEGATIVE

## 2018-02-09 LAB — TSH: TSH: 1.885 u[IU]/mL (ref 0.350–4.500)

## 2018-02-09 LAB — LACTIC ACID, PLASMA: Lactic Acid, Venous: 1.4 mmol/L (ref 0.5–1.9)

## 2018-02-09 LAB — STREP PNEUMONIAE URINARY ANTIGEN: STREP PNEUMO URINARY ANTIGEN: NEGATIVE

## 2018-02-09 MED ORDER — SODIUM CHLORIDE 0.9% IV SOLUTION: Freq: Once | INTRAVENOUS | Status: DC

## 2018-02-09 MED ORDER — ONDANSETRON HCL 4 MG/2ML IJ SOLN
4.0000 mg | Freq: Once | INTRAMUSCULAR | Status: AC
  Administered 2018-02-09: 4 mg via INTRAVENOUS
  Filled 2018-02-09: qty 2

## 2018-02-09 MED ORDER — HYDROCODONE-ACETAMINOPHEN 5-325 MG PO TABS
1.0000 | ORAL_TABLET | ORAL | Status: DC | PRN
  Administered 2018-02-10 – 2018-02-11 (×3): 1 via ORAL
  Administered 2018-02-11 – 2018-02-12 (×3): 2 via ORAL
  Filled 2018-02-09: qty 2
  Filled 2018-02-09 (×2): qty 1
  Filled 2018-02-09 (×2): qty 2
  Filled 2018-02-09: qty 1

## 2018-02-09 MED ORDER — INSULIN ASPART 100 UNIT/ML ~~LOC~~ SOLN: 0.0000 [IU] | Freq: Every day | SUBCUTANEOUS | Status: DC

## 2018-02-09 MED ORDER — HYDROCORTISONE NA SUCCINATE PF 100 MG IJ SOLR
100.0000 mg | Freq: Three times a day (TID) | INTRAMUSCULAR | Status: DC
  Administered 2018-02-10 – 2018-02-11 (×4): 100 mg via INTRAVENOUS
  Filled 2018-02-09 (×7): qty 2

## 2018-02-09 MED ORDER — LEVOFLOXACIN IN D5W 750 MG/150ML IV SOLN
750.0000 mg | INTRAVENOUS | Status: DC
  Administered 2018-02-10: 750 mg via INTRAVENOUS
  Filled 2018-02-09: qty 150

## 2018-02-09 MED ORDER — SODIUM CHLORIDE 0.9% FLUSH: 10.0000 mL | INTRAVENOUS | Status: DC | PRN

## 2018-02-09 MED ORDER — GABAPENTIN 300 MG PO CAPS
300.0000 mg | ORAL_CAPSULE | Freq: Three times a day (TID) | ORAL | Status: DC
  Administered 2018-02-09 – 2018-02-13 (×11): 300 mg via ORAL
  Filled 2018-02-09 (×11): qty 1

## 2018-02-09 MED ORDER — FOLIC ACID 1 MG PO TABS
1.0000 mg | ORAL_TABLET | Freq: Every day | ORAL | Status: DC
  Administered 2018-02-09 – 2018-02-13 (×5): 1 mg via ORAL
  Filled 2018-02-09 (×5): qty 1

## 2018-02-09 MED ORDER — POLYETHYLENE GLYCOL 3350 17 G PO PACK: 17.0000 g | PACK | Freq: Every day | ORAL | Status: DC | PRN

## 2018-02-09 MED ORDER — SODIUM CHLORIDE 0.9 % IV BOLUS
1000.0000 mL | Freq: Once | INTRAVENOUS | Status: AC
  Administered 2018-02-09: 1000 mL via INTRAVENOUS

## 2018-02-09 MED ORDER — INSULIN ASPART 100 UNIT/ML ~~LOC~~ SOLN
SUBCUTANEOUS | Status: AC
  Filled 2018-02-09: qty 1

## 2018-02-09 MED ORDER — NOREPINEPHRINE 4 MG/250ML-% IV SOLN
0.0000 ug/min | Freq: Once | INTRAVENOUS | Status: AC
  Administered 2018-02-09: 2 ug/min via INTRAVENOUS
  Filled 2018-02-09: qty 250

## 2018-02-09 MED ORDER — ACETAMINOPHEN 325 MG PO TABS: 650.0000 mg | ORAL_TABLET | Freq: Once | ORAL | Status: DC

## 2018-02-09 MED ORDER — VANCOMYCIN HCL IN DEXTROSE 1-5 GM/200ML-% IV SOLN
1000.0000 mg | Freq: Once | INTRAVENOUS | Status: AC
  Administered 2018-02-09: 1000 mg via INTRAVENOUS
  Filled 2018-02-09: qty 200

## 2018-02-09 MED ORDER — IPRATROPIUM-ALBUTEROL 0.5-2.5 (3) MG/3ML IN SOLN
3.0000 mL | Freq: Four times a day (QID) | RESPIRATORY_TRACT | Status: DC
  Administered 2018-02-09 – 2018-02-10 (×3): 3 mL via RESPIRATORY_TRACT
  Filled 2018-02-09 (×3): qty 3

## 2018-02-09 MED ORDER — HYDROCORTISONE NA SUCCINATE PF 100 MG IJ SOLR
100.0000 mg | Freq: Once | INTRAMUSCULAR | Status: AC
  Administered 2018-02-09: 100 mg via INTRAVENOUS
  Filled 2018-02-09: qty 2

## 2018-02-09 MED ORDER — SODIUM CHLORIDE 0.9% FLUSH
3.0000 mL | Freq: Two times a day (BID) | INTRAVENOUS | Status: DC
  Administered 2018-02-09 – 2018-02-13 (×7): 3 mL via INTRAVENOUS

## 2018-02-09 MED ORDER — ONDANSETRON HCL 4 MG PO TABS: 4.0000 mg | ORAL_TABLET | Freq: Four times a day (QID) | ORAL | Status: DC | PRN

## 2018-02-09 MED ORDER — SIMVASTATIN 20 MG PO TABS
10.0000 mg | ORAL_TABLET | Freq: Every day | ORAL | Status: DC
  Administered 2018-02-09 – 2018-02-13 (×5): 10 mg via ORAL
  Filled 2018-02-09 (×5): qty 1

## 2018-02-09 MED ORDER — FERROUS SULFATE 325 (65 FE) MG PO TABS
325.0000 mg | ORAL_TABLET | Freq: Every day | ORAL | Status: DC
  Administered 2018-02-09 – 2018-02-13 (×5): 325 mg via ORAL
  Filled 2018-02-09 (×5): qty 1

## 2018-02-09 MED ORDER — INSULIN ASPART 100 UNIT/ML ~~LOC~~ SOLN
0.0000 [IU] | Freq: Three times a day (TID) | SUBCUTANEOUS | Status: DC
  Administered 2018-02-09: 2 [IU] via SUBCUTANEOUS
  Administered 2018-02-10: 3 [IU] via SUBCUTANEOUS
  Administered 2018-02-10 – 2018-02-11 (×3): 2 [IU] via SUBCUTANEOUS
  Filled 2018-02-09 (×4): qty 1

## 2018-02-09 MED ORDER — ADULT MULTIVITAMIN W/MINERALS CH
1.0000 | ORAL_TABLET | Freq: Every day | ORAL | Status: DC
Start: 2018-02-09 — End: 2018-02-13
  Administered 2018-02-10 – 2018-02-13 (×4): 1 via ORAL
  Filled 2018-02-09 (×4): qty 1

## 2018-02-09 MED ORDER — NOREPINEPHRINE 4 MG/250ML-% IV SOLN
0.0000 ug/min | Freq: Once | INTRAVENOUS | Status: AC
  Administered 2018-02-09: 9 ug/min via INTRAVENOUS

## 2018-02-09 MED ORDER — VITAMIN B-1 100 MG PO TABS
100.0000 mg | ORAL_TABLET | Freq: Every day | ORAL | Status: DC
  Administered 2018-02-09 – 2018-02-13 (×5): 100 mg via ORAL
  Filled 2018-02-09 (×5): qty 1

## 2018-02-09 MED ORDER — FAMOTIDINE IN NACL 20-0.9 MG/50ML-% IV SOLN
20.0000 mg | Freq: Two times a day (BID) | INTRAVENOUS | Status: DC
  Administered 2018-02-09 – 2018-02-10 (×2): 20 mg via INTRAVENOUS
  Filled 2018-02-09 (×2): qty 50

## 2018-02-09 MED ORDER — SODIUM CHLORIDE 0.9 % IV SOLN
INTRAVENOUS | Status: DC
  Administered 2018-02-09 – 2018-02-11 (×4): via INTRAVENOUS

## 2018-02-09 MED ORDER — ACETAMINOPHEN 650 MG RE SUPP: 650.0000 mg | Freq: Four times a day (QID) | RECTAL | Status: DC | PRN

## 2018-02-09 MED ORDER — DIPHENHYDRAMINE HCL 25 MG PO CAPS
25.0000 mg | ORAL_CAPSULE | Freq: Once | ORAL | Status: DC
  Filled 2018-02-09: qty 1

## 2018-02-09 MED ORDER — SODIUM CHLORIDE 0.9 % IV SOLN
2.0000 g | Freq: Once | INTRAVENOUS | Status: AC
  Administered 2018-02-09: 2 g via INTRAVENOUS
  Filled 2018-02-09: qty 2

## 2018-02-09 MED ORDER — FUROSEMIDE 10 MG/ML IJ SOLN
20.0000 mg | Freq: Once | INTRAMUSCULAR | Status: AC
  Administered 2018-02-09: 20 mg via INTRAVENOUS
  Filled 2018-02-09: qty 2

## 2018-02-09 MED ORDER — ORAL CARE MOUTH RINSE
15.0000 mL | Freq: Two times a day (BID) | OROMUCOSAL | Status: DC
  Administered 2018-02-10 – 2018-02-12 (×4): 15 mL via OROMUCOSAL

## 2018-02-09 MED ORDER — ACETAMINOPHEN 325 MG PO TABS
650.0000 mg | ORAL_TABLET | Freq: Four times a day (QID) | ORAL | Status: DC | PRN
  Administered 2018-02-10: 650 mg via ORAL
  Filled 2018-02-09: qty 2

## 2018-02-09 MED ORDER — ONDANSETRON HCL 4 MG/2ML IJ SOLN: 4.0000 mg | Freq: Four times a day (QID) | INTRAMUSCULAR | Status: DC | PRN

## 2018-02-09 MED ORDER — BUDESONIDE 0.5 MG/2ML IN SUSP
0.5000 mg | Freq: Two times a day (BID) | RESPIRATORY_TRACT | Status: DC
  Administered 2018-02-10: 0.5 mg via RESPIRATORY_TRACT
  Filled 2018-02-09 (×5): qty 2

## 2018-02-09 MED ORDER — LEVOFLOXACIN IN D5W 750 MG/150ML IV SOLN
750.0000 mg | Freq: Once | INTRAVENOUS | Status: AC
  Administered 2018-02-09: 750 mg via INTRAVENOUS
  Filled 2018-02-09: qty 150

## 2018-02-09 MED ORDER — VANCOMYCIN HCL IN DEXTROSE 1-5 GM/200ML-% IV SOLN
1000.0000 mg | Freq: Two times a day (BID) | INTRAVENOUS | Status: DC
  Administered 2018-02-09 – 2018-02-10 (×2): 1000 mg via INTRAVENOUS
  Filled 2018-02-09 (×3): qty 200

## 2018-02-09 NOTE — Progress Notes (Signed)
Pharmacy Antibiotic Note  Shawn SchlichterRobert Tilley is a 62 y.o. male admitted on 02/09/2018 with pneumonia.  Pharmacy has been consulted for vancomycin dosing.  Plan: Check MRSA PCR and PCT per protocol. Give vancomycin 1 gm IV x 1 in ED followed in approximately 6 hours (stacked dosing) by vancomycin 1 gm IV Q12H, predicted trough 18 mcg/ml. Pharmacy will continue to follow and adjust as needed to maintain trough 15 to 20 mcg/ml.   Vd 47.6 L, Ke 0.065 hr-1, T1/2 10.7 hr  Height: 5\' 10"  (177.8 cm) Weight: 150 lb (68 kg) IBW/kg (Calculated) : 73  Temp (24hrs), Avg:98.3 F (36.8 C), Min:98.3 F (36.8 C), Max:98.3 F (36.8 C)  Recent Labs  Lab 02/09/18 1126  WBC 5.3  CREATININE 1.00  LATICACIDVEN 1.4    Estimated Creatinine Clearance: 72.7 mL/min (by C-G formula based on SCr of 1 mg/dL).    Allergies  Allergen Reactions  . Cefazolin Other (See Comments)    Reaction not listed on MAR  . Cephalosporins   . Penicillins Other (See Comments)    Has patient had a PCN reaction causing immediate rash, facial/tongue/throat swelling, SOB or lightheadedness with hypotension: Unknown Has patient had a PCN reaction causing severe rash involving mucus membranes or skin necrosis: Unknown Has patient had a PCN reaction that required hospitalization: Unknown Has patient had a PCN reaction occurring within the last 10 years: Unknown If all of the above answers are "NO", then may proceed with Cephalosporin use.     Antimicrobials this admission:   Dose adjustments this admission:   Microbiology results:  BCx:   UCx:    Sputum:    MRSA PCR:   Thank you for allowing pharmacy to be a part of this patient's care.  Carola FrostNathan A Aiysha Jillson, Pharm.D., BCPS Clinical Pharmacist 02/09/2018 3:18 PM

## 2018-02-09 NOTE — Progress Notes (Signed)
Family Meeting Note  Advance Directive:yes  Today a meeting took place with the Patient.  Patient is able to participate   The following clinical team members were present during this meeting:MD  The following were discussed:Patient's diagnosis: Septic shock, Patient's progosis: Unable to determine and Goals for treatment: Full Code  Additional follow-up to be provided: prn  Time spent during discussion:20 minutes  Bertrum SolMontell D Saloma Cadena, MD

## 2018-02-09 NOTE — Progress Notes (Signed)
Peripherally Inserted Central Catheter/Midline Placement  The IV Nurse has discussed with the patient and/or persons authorized to consent for the patient, the purpose of this procedure and the potential benefits and risks involved with this procedure.  The benefits include less needle sticks, lab draws from the catheter, and the patient may be discharged home with the catheter. Risks include, but not limited to, infection, bleeding, blood clot (thrombus formation), and puncture of an artery; nerve damage and irregular heartbeat and possibility to perform a PICC exchange if needed/ordered by physician.  Alternatives to this procedure were also discussed.  Bard Power PICC patient education guide, fact sheet on infection prevention and patient information card has been provided to patient /or left at bedside.    PICC/Midline Placement Documentation  PICC Triple Lumen 02/09/18 PICC Right Brachial 39 cm 0 cm (Active)  Indication for Insertion or Continuance of Line Poor Vasculature-patient has had multiple peripheral attempts or PIVs lasting less than 24 hours 02/09/2018  8:02 PM  Exposed Catheter (cm) 0 cm 02/09/2018  8:02 PM  Site Assessment Bleeding 02/09/2018  8:02 PM  Lumen #1 Status Blood return noted;Flushed;Saline locked 02/09/2018  8:02 PM  Lumen #2 Status Blood return noted;Flushed;Saline locked 02/09/2018  8:02 PM  Lumen #3 Status Blood return noted;Flushed;Saline locked 02/09/2018  8:02 PM  Dressing Type Transparent;Securing device 02/09/2018  8:02 PM  Dressing Status Clean;Dry;Intact;Antimicrobial disc in place 02/09/2018  8:02 PM  Dressing Intervention New dressing 02/09/2018  8:02 PM  Dressing Change Due 02/16/18 02/09/2018  8:02 PM       Netta Corriganhomas, Karita Dralle L 02/09/2018, 8:21 PM

## 2018-02-09 NOTE — Progress Notes (Signed)
CODE SEPSIS - PHARMACY COMMUNICATION  **Broad Spectrum Antibiotics should be administered within 1 hour of Sepsis diagnosis**  Time Code Sepsis Called/Page Received: 1141 - code order completed at 1144  Antibiotics Ordered: Aztreonam, Levofloxacin   Time of 1st antibiotic administration: Levofloxacin 1238  Additional action taken by pharmacy: none indicated   If necessary, Name of Provider/Nurse Contacted: N/A    Simpson,Michael L ,PharmD Clinical Pharmacist  02/09/2018  11:49 AM

## 2018-02-09 NOTE — ED Notes (Addendum)
Stage two wounds on sacrum and buttocks. No foul odor or drainage present.

## 2018-02-09 NOTE — ED Notes (Signed)
ED Provider at bedside. 

## 2018-02-09 NOTE — ED Notes (Signed)
Per MAR from facility, it appears that patient received daily dose of his lisinopril this AM

## 2018-02-09 NOTE — ED Provider Notes (Signed)
[facility] Emergency Department Provider Note ____________________________________________   First MD Initiated Contact with Patient 02/09/18 1134     (approximate)  I have reviewed the triage vital signs and the nursing notes.   HISTORY  Chief Complaint Hypotension    HPI [name] is a 62 y.o. male with PMH as noted below who presents from assisted living with low blood pressure and shortness of breath.  The patient has a history of right lower extremity AKA, and a colostomy.  He also has wounds on his sacral and buttock area.  The patient states that he feels somewhat weak, but denies other acute complaints.  Past Medical History:  Diagnosis Date  . Diabetes mellitus without complication (HCC)   . Hypertension     Patient Active Problem List   Diagnosis Date Noted  . Pressure injury of skin 03/10/2017  . Hyperkalemia   . Hyponatremia   . Septic shock (HCC)   . AKI (acute kidney injury) (HCC) 03/09/2017    Past Surgical History:  Procedure Laterality Date  . ABOVE KNEE LEG AMPUTATION Right   . COLOSTOMY    . shoulder surgery      Prior to Admission medications   Medication Sig Start Date End Date Taking? Authorizing Provider  acetaminophen (TYLENOL) 325 MG tablet Take 650 mg by mouth every 4 (four) hours as needed.   Yes [provider]  ferrous sulfate 325 (65 FE) MG tablet Take 1 tablet by mouth daily.   Yes [provider]  folic acid (FOLVITE) 1 MG tablet Take 1 mg by mouth daily.   Yes [provider]  gabapentin (NEURONTIN) 300 MG capsule Take 300 mg by mouth 3 (three) times daily.   Yes [provider]  indomethacin (INDOCIN) 50 MG capsule Take 50 mg by mouth 2 (two) times daily with a meal.   Yes [provider]  lisinopril (PRINIVIL,ZESTRIL) 30 MG tablet Take 30 mg by mouth.    Yes [provider]  Multiple Vitamin (MULTIVITAMIN) tablet Take 1 tablet by mouth daily.   Yes  [provider]  omeprazole (PRILOSEC) 20 MG capsule Take 1 capsule by mouth daily.   Yes [provider]  thiamine (VITAMIN B-1) 100 MG tablet Take 100 mg by mouth daily.   Yes [provider]  amLODipine (NORVASC) 10 MG tablet Take 10 mg by mouth at bedtime.    [provider]  cyclobenzaprine (FLEXERIL) 5 MG tablet Take 1 tablet (5 mg total) by mouth 3 (three) times daily as needed for muscle spasms. Patient not taking: Reported on 02/09/2018 05/31/17   Nita Sickle, MD  oxyCODONE-acetaminophen (ROXICET) 5-325 MG tablet Take 1 tablet by mouth every 6 (six) hours as needed. Patient not taking: Reported on 02/09/2018 05/31/17 05/31/18  Nita Sickle, MD  predniSONE (DELTASONE) 10 MG tablet Take 4 tablets (40 mg total) by mouth daily with breakfast. Patient not taking: Reported on 02/09/2018 03/14/17   Altamese Dilling, MD  simvastatin (ZOCOR) 10 MG tablet Take 1 tablet by mouth daily.    [provider]  traMADol (ULTRAM) 50 MG tablet Take 50 mg by mouth 2 (two) times daily as needed.    [provider]    Allergies Cefazolin; Cephalosporins; and Penicillins  No family history on file.  Social History Social History   Tobacco Use  . Smoking status: Current Every Day Smoker    Packs/day: 0.50    Types: Cigarettes  . Smokeless tobacco: Former Neurosurgeon  Substance Use Topics  . Alcohol use: Yes    Comment: "when I can"  . Drug use: No    Review of Systems  Constitutional: Positive for generalized weakness.   Eyes: No redness. ENT: No sore throat. Cardiovascular: Denies chest pain. Respiratory: Denie positive for shortness of breath. Gastrointestinal: No vomiting. Genitourinary: Negative for dysuria.  Musculoskeletal: Positive for back pain. Skin: Negative for rash. Neurological: Negative for headache.   ____________________________________________   PHYSICAL EXAM:  VITAL SIGNS: ED Triage Vitals  Enc Vitals  Group     BP 02/09/18 1122 (!) 63/54     Pulse Rate 02/09/18 1122 (!) 58     Resp 02/09/18 1124 18     Temp 02/09/18 1124 98.3 F (36.8 C)     Temp Source 02/09/18 1124 Oral     SpO2 02/09/18 1122 94 %     Weight 02/09/18 1124 150 lb (68 kg)     Height 02/09/18 1124 5\' 10"  (1.778 m)     Head Circumference --      Peak Flow --      Pain Score --      Pain Loc --      Pain Edu? --      Excl. in GC? --     Constitutional: Alert and oriented.  Weak appearing but in no acute distress.   Eyes: Conjunctivae are normal.  EOMI.  PERRLA. Head: Atraumatic. Nose: No congestion/rhinnorhea. Mouth/Throat: Mucous membranes are somewhat dry.   Neck: Normal range of motion.  Cardiovascular: Normal rate, regular rhythm. Grossly normal heart sounds.  Good peripheral circulation. Respiratory: Normal respiratory effort.  No retractions. Lungs CTAB. Gastrointestinal: Soft and nontender. No distention.  Genitourinary: No flank tenderness. Musculoskeletal:  Extremities warm and well perfused.  Neurologic:  Normal speech and language. No gross focal neurologic deficits are appreciated.  Skin:  Skin is warm and dry. No rash noted.  Bilateral sacral and buttock stage II ulcers with no surrounding erythema, induration, or drainage. Psychiatric: Mood and affect are normal. Speech and behavior are normal.  ____________________________________________   LABS (all labs ordered are listed, but only abnormal results are displayed)  Labs Reviewed  COMPREHENSIVE METABOLIC PANEL - Abnormal; Notable for the following components:      Result Value   Sodium 132 (*)    Glucose, Bld 112 (*)    Calcium 7.6 (*)    Total Protein 6.2 (*)    Albumin 2.0 (*)    Alkaline Phosphatase 137 (*)    All other components within normal limits  BRAIN NATRIURETIC PEPTIDE - Abnormal; Notable for the following components:   B Natriuretic Peptide 109.0 (*)    All other components within normal limits  CBC WITH  DIFFERENTIAL/PLATELET - Abnormal; Notable for the following components:   RBC 2.76 (*)    Hemoglobin 8.1 (*)    HCT 23.8 (*)    RDW 16.4 (*)    Lymphs Abs 0.5 (*)    All other components within normal limits  URINALYSIS, COMPLETE (UACMP) WITH MICROSCOPIC - Abnormal; Notable for the following components:   Color, Urine AMBER (*)    APPearance CLOUDY (*)    Glucose, UA   (*)    Value: TEST NOT REPORTED DUE TO COLOR INTERFERENCE OF URINE PIGMENT   Hgb urine dipstick   (*)    Value: TEST NOT REPORTED DUE TO COLOR INTERFERENCE OF URINE PIGMENT   Bilirubin Urine   (*)    Value: TEST NOT REPORTED DUE TO  COLOR INTERFERENCE OF URINE PIGMENT   Ketones, ur   (*)    Value: TEST NOT REPORTED DUE TO COLOR INTERFERENCE OF URINE PIGMENT   Protein, ur   (*)    Value: TEST NOT REPORTED DUE TO COLOR INTERFERENCE OF URINE PIGMENT   Nitrite   (*)    Value: TEST NOT REPORTED DUE TO COLOR INTERFERENCE OF URINE PIGMENT   Leukocytes, UA   (*)    Value: TEST NOT REPORTED DUE TO COLOR INTERFERENCE OF URINE PIGMENT   RBC / HPF >50 (*)    WBC, UA >50 (*)    Bacteria, UA MANY (*)    All other components within normal limits  CULTURE, BLOOD (ROUTINE X 2)  CULTURE, BLOOD (ROUTINE X 2)  LACTIC ACID, PLASMA  TROPONIN I  LACTIC ACID, PLASMA   ____________________________________________  EKG  ED ECG REPORT I, Dionne BucySebastian Lovelle Deitrick, the attending physician, personally viewed and interpreted this ECG.  Date: 02/09/2018 EKG Time: 1131 Rate: 59 Rhythm: normal sinus rhythm QRS Axis: normal Intervals: normal ST/T Wave abnormalities: normal Narrative Interpretation: no evidence of acute ischemia  ____________________________________________  RADIOLOGY  CXR: Left base atelectasis/infiltrate  ____________________________________________   PROCEDURES  Procedure(s) performed: No  Procedures  Critical Care performed: Yes  CRITICAL CARE Performed by: Dionne BucySebastian Kema Santaella   Total critical care time:  50 minutes  Critical care time was exclusive of separately billable procedures and treating other patients.  Critical care was necessary to treat or prevent imminent or life-threatening deterioration.  Critical care was time spent personally by me on the following activities: development of treatment plan with patient and/or surrogate as well as nursing, discussions with consultants, evaluation of patient's response to treatment, examination of patient, obtaining history from patient or surrogate, ordering and performing treatments and interventions, ordering and review of laboratory studies, ordering and review of radiographic studies, pulse oximetry and re-evaluation of patient's condition. ____________________________________________   INITIAL IMPRESSION / ASSESSMENT AND PLAN / ED COURSE  Pertinent labs & imaging results that were available during my care of the patient were reviewed by me and considered in my medical decision making (see chart for details).  62 year old male with PMH as noted above presents with hypotension and generalized weakness.  Per EMS, the patient was 85% on room air and he does report being somewhat short of breath.  Reviewed the past medical records in epic; the patient's last ED visit was last fall for leg pain.  On exam, the patient is somewhat weak appearing, he is hypotensive and hypoxic but his other vital signs are normal, and he is in no respiratory distress when on oxygen.  He has no evidence of cellulitis around his sacral ulcers.  There is no other evidence of skin or soft tissue infection and his abdomen is soft.  Overall I suspect most likely infection/sepsis, versus dehydration/hypovolemia, other metabolic etiology, or cardiac cause.  Will obtain sepsis work-up, give fluids, and reassess.  Anticipate admission.  ----------------------------------------- 1:38 PM on 02/09/2018 -----------------------------------------  Patient's urinalysis is  grossly bloody, and the urine is malodorous and cloudy.  I suspect that this is the most likely source, although he also has an infiltrate on his chest x-ray.  Given the patient's medication allergies, I started Levaquin and aztreonam as per the sepsis protocol.  The patient's lactate is not elevated, however his blood pressure has only improved slightly after 3 L of fluids, so I will start peripheral levo fed.  The patient is also more anemic than baseline although  he does not have an obvious source of bleeding at this time and does not require transfusion.  Patient is stable for admission.  I signed him out to the hospitalist Dr. Katheren Shams. ____________________________________________   FINAL CLINICAL IMPRESSION(S) / ED DIAGNOSES  Final diagnoses:  Sepsis, due to unspecified organism [facility])      NEW MEDICATIONS STARTED DURING THIS VISIT:  New Prescriptions   No medications on file     Note:  This document was prepared using Dragon voice recognition software and may include unintentional dictation errors.    Dionne Bucy, MD 02/09/18 1339

## 2018-02-09 NOTE — ED Triage Notes (Addendum)
Pt to ER via [facility] from [facility] for low blood pressure this AM. Pt does not usually wear oxygen, 85% on RA. Arrives on 2L [place]. Pt has sacral wound that has been being treated with wound vac-. Low BP with EMS. CBG WNL with EMS. 20G to L forearm established from EMS. 500cc NS infused with EMS. Alert and oriented X4.

## 2018-02-09 NOTE — Consult Note (Signed)
Reason for Consult:Septic Shock Referring Physician: Dr. Rick Duff [name] is an 62 y.o. male.  HPI: [name] is a 63 year old gentleman with a past medical history of hypertension, diabetes, peripheral arterial disease, status post AKA, diverticulitis, status post colostomy, lives in assisted living, presented to the emergency department with complaints of shortness of breath. He was found to be significantly hypotensive and code sepsis was called. He was started on fluid resuscitation, antibiotics were given, Levaquin, vancomycin, he was started on norepinephrine. Probable source include both urine and lungs. Patient is also noted to have decubitus wounds over sacrum and buttock area. He is being admitted to the intensive care unit  Past Medical History:  Diagnosis Date  . Diabetes mellitus without complication ([place])   . Hypertension     Past Surgical History:  Procedure Laterality Date  . ABOVE KNEE LEG AMPUTATION Right   . COLOSTOMY    . shoulder surgery      No family history on file.  Social History:  reports that he has been smoking cigarettes.  He has been smoking about 0.50 packs per day. He has quit using smokeless tobacco. He reports that he drinks alcohol. He reports that he does not use drugs.  Allergies:  Allergies  Allergen Reactions  . Cefazolin Other (See Comments)    Reaction not listed on MAR  . Cephalosporins   . Penicillins Other (See Comments)    Has patient had a PCN reaction causing immediate rash, facial/tongue/throat swelling, SOB or lightheadedness with hypotension: Unknown Has patient had a PCN reaction causing severe rash involving mucus membranes or skin necrosis: Unknown Has patient had a PCN reaction that required hospitalization: Unknown Has patient had a PCN reaction occurring within the last 10 years: Unknown If all of the above answers are "NO", then may proceed with Cephalosporin use.     Medications: I have reviewed the patient's  current medications.  Results for orders placed or performed during the hospital encounter of 02/09/18 (from the past 48 hour(s))  Lactic acid, plasma     Status: None   Collection Time: 02/09/18 11:26 AM  Result Value Ref Range   Lactic Acid, Venous 1.4 0.5 - 1.9 mmol/L    Comment: Performed at [facility], [address] [place], [place] [address]  Comprehensive metabolic panel     Status: Abnormal   Collection Time: 02/09/18 11:26 AM  Result Value Ref Range   Sodium 132 (L) 135 - 145 mmol/L   Potassium 4.4 3.5 - 5.1 mmol/L   Chloride 104 98 - 111 mmol/L   CO2 22 22 - 32 mmol/L   Glucose, Bld 112 (H) 70 - 99 mg/dL   BUN 16 8 - 23 mg/dL   Creatinine, Ser 1.00 0.61 - 1.24 mg/dL   Calcium 7.6 (L) 8.9 - 10.3 mg/dL   Total Protein 6.2 (L) 6.5 - 8.1 g/dL   Albumin 2.0 (L) 3.5 - 5.0 g/dL   AST 24 15 - 41 U/L   ALT 9 0 - 44 U/L   Alkaline Phosphatase 137 (H) 38 - 126 U/L   Total Bilirubin 0.7 0.3 - 1.2 mg/dL   GFR calc non Af Amer >60 >60 mL/min   GFR calc Af Amer >60 >60 mL/min    Comment: (NOTE) The eGFR has been calculated using the CKD EPI equation. This calculation has not been validated in all clinical situations. eGFR's persistently <60 mL/min signify possible Chronic Kidney Disease.    Anion gap 6 5 -  15    Comment: Performed at [facility], [address] [place], [place] [address]  Brain natriuretic peptide     Status: Abnormal   Collection Time: 02/09/18 11:26 AM  Result Value Ref Range   B Natriuretic Peptide 109.0 (H) 0.0 - 100.0 pg/mL    Comment: Performed at [facility], [address] [place], [place] [address]  Troponin I     Status: None   Collection Time: 02/09/18 11:26 AM  Result Value Ref Range   Troponin I <0.03 <0.03 ng/mL    Comment: Performed at [facility], [address] [place], [place] [address]  CBC WITH DIFFERENTIAL     Status: Abnormal   Collection Time: 02/09/18 11:26 AM  Result Value Ref Range    WBC 5.3 3.8 - 10.6 K/uL   RBC 2.76 (L) 4.40 - 5.90 MIL/uL   Hemoglobin 8.1 (L) 13.0 - 18.0 g/dL   HCT 23.8 (L) 40.0 - 52.0 %   MCV 86.1 80.0 - 100.0 fL   MCH 29.4 26.0 - 34.0 pg   MCHC 34.1 32.0 - 36.0 g/dL   RDW 16.4 (H) 11.5 - 14.5 %   Platelets 194 150 - 440 K/uL   Neutrophils Relative % 81 %   Neutro Abs 4.3 1.4 - 6.5 K/uL   Lymphocytes Relative 10 %   Lymphs Abs 0.5 (L) 1.0 - 3.6 K/uL   Monocytes Relative 7 %   Monocytes Absolute 0.4 0.2 - 1.0 K/uL   Eosinophils Relative 2 %   Eosinophils Absolute 0.1 0 - 0.7 K/uL   Basophils Relative 0 %   Basophils Absolute 0.0 0 - 0.1 K/uL    Comment: Performed at [facility], [address] [place], [place] [address]  Urinalysis, Complete w Microscopic     Status: Abnormal   Collection Time: 02/09/18 11:44 AM  Result Value Ref Range   Color, Urine AMBER (A) YELLOW   APPearance CLOUDY (A) CLEAR   Specific Gravity, Urine 1.015 1.005 - 1.030   pH  5.0 - 8.0    TEST NOT REPORTED DUE TO COLOR INTERFERENCE OF URINE PIGMENT   Glucose, UA (A) NEGATIVE mg/dL    TEST NOT REPORTED DUE TO COLOR INTERFERENCE OF URINE PIGMENT   Hgb urine dipstick (A) NEGATIVE    TEST NOT REPORTED DUE TO COLOR INTERFERENCE OF URINE PIGMENT   Bilirubin Urine (A) NEGATIVE    TEST NOT REPORTED DUE TO COLOR INTERFERENCE OF URINE PIGMENT   Ketones, ur (A) NEGATIVE mg/dL    TEST NOT REPORTED DUE TO COLOR INTERFERENCE OF URINE PIGMENT   Protein, ur (A) NEGATIVE mg/dL    TEST NOT REPORTED DUE TO COLOR INTERFERENCE OF URINE PIGMENT   Nitrite (A) NEGATIVE    TEST NOT REPORTED DUE TO COLOR INTERFERENCE OF URINE PIGMENT   Leukocytes, UA (A) NEGATIVE    TEST NOT REPORTED DUE TO COLOR INTERFERENCE OF URINE PIGMENT   RBC / HPF >50 (H) 0 - 5 RBC/hpf   WBC, UA >50 (H) 0 - 5 WBC/hpf   Bacteria, UA MANY (A) NONE SEEN   Squamous Epithelial / LPF NONE SEEN 0 - 5   Mucus PRESENT     Comment: Performed at [facility], [address] [place], [place]  [address]    Dg Chest Port 1 View  Result Date: 02/09/2018 CLINICAL DATA:  Hypotension.  Hypoxia. EXAM: PORTABLE CHEST 1 VIEW COMPARISON:  03/12/2017. FINDINGS: Cardiomegaly with mild pulmonary vascular prominence. Left base  atelectasis/infiltrate with small left pleural effusion. No pneumothorax. Plate and screw fixation of the left humerus. Plate and screw fixation of the cervical spine. Stable deformity right humerus. Postsurgical changes left humerus. IMPRESSION: 1. Left base atelectasis/infiltrate with small left pleural effusion. 2.  Cardiomegaly with pulmonary venous congestion. Electronically Signed   By: Marcello Moores  Register   On: 02/09/2018 12:05    ROS Blood pressure 94/68, pulse 69, temperature 98.3 F (36.8 C), temperature source Oral, resp. rate 14, height '5\' 10"'$  (1.778 m), weight 150 lb (68 kg), SpO2 95 %. Physical Exam patient is awake, alert, oriented in no acute distress. Vital signs: Please see the above listed vital signs HEENT: No oral lesions appreciated, trachea midline, no accessory muscle utilization Cardiovascular: regular rate and rhythm, pulse rate in the 70s  Pulmonary: Clear to auscultation anteriorly, crackles appreciated in the left posterior lung zone Abdominal: Positive bowel sounds, soft exam Extremities: Right AKA Neurologic: Patient moves all extremities Obvious focal deficits noted Cutaneous: Decubitus ulcer noted sacrum and buttocks,will consult wound care  Assessment/Plan:  Septic shock. Possible sources include left lower lobe pneumonia. Chest x-ray reveals sclerotic aorta, cardiomegaly with left lower lobe process. Patient also has abnormal urinalysis, was unable to be done secondary to color in appearance. Agree with empiric broad-spectrum coverage. Patient has cephalosporin allergy, will use vancomycin and Levaquin. Fluid resuscitation, norepinephrine for assistance with pressure.   Relative bradycardia.In the setting of sepsis would've expected him to be  tachycardic.Will check random cortisol level along with TSH. Upon further questioning patient states his blood pressure generally runs low and mentioning something about hydrocortisone. We'll give 100 mg of hydrocortisone every 8  Anemia. Patient's last hemoglobin was 11.9, now down to 8.1. No clear evidence of active bleeding at this time. Patient has received 3-1/2 L of saline, will hold on transfusion  Peripheral vascular disease. Status post AKA  Diabetes mellitus. Sliding scale coverage  Decubitus. Patient with areas on his sacrum and buttocks, wound care nurse  Will call for PICC access   [name] 02/09/2018, 3:25 PM

## 2018-02-09 NOTE — H&P (Signed)
[employer] - [place] [facility]   PATIENT NAME: [name]    MR#:  [id number]  DATE OF BIRTH:  Feb 16, 1956  DATE OF ADMISSION:  02/09/2018  PRIMARY CARE PHYSICIAN: Armando Gang, FNP   REQUESTING/REFERRING PHYSICIAN:   CHIEF COMPLAINT:   Chief Complaint  Patient presents with  . Hypotension    HISTORY OF PRESENT ILLNESS: [name]  is a 62 y.o. male with a known history per below which also includes diverticulitis, status post colostomy, chronic ischio/sacral wound stage II, right AKA presenting from assisted living facility with utilized weakness, hypotension, shortness of breath O2 saturation on presentation was 85%, in the emergency room patient was found to be severely hypotensive with systolic blood pressure in the 50s to 60s, mild bradycardia, BNP was normal, troponin negative, urinalysis grossly abnormal, EKG with normal sinus rhythm, EKG with left-sided pneumonia, hemoglobin 8.1 down from 11.9 last check, guaiac positive, blood pressure was still in the 70s despite fluid resuscitation, code sepsis/septic shock initiated in the emergency room, started on Levophed, patient evaluated emergency room, no apparent distress, resting comfortably in bed, patient now be admitted for acute septic shock secondary to acute community-acquired pneumonia, probable UTI, and symptomatic anemia with guaiac positive.  PAST MEDICAL HISTORY:   Past Medical History:  Diagnosis Date  . Diabetes mellitus without complication (HCC)   . Hypertension     PAST SURGICAL HISTORY:  Past Surgical History:  Procedure Laterality Date  . ABOVE KNEE LEG AMPUTATION Right   . COLOSTOMY    . shoulder surgery      SOCIAL HISTORY:  Social History   Tobacco Use  . Smoking status: Current Every Day Smoker    Packs/day: 0.50    Types: Cigarettes  . Smokeless tobacco: Former Engineer, water Use Topics  . Alcohol use: Yes    Comment: "when I can"    FAMILY HISTORY: No family  history on file.  DRUG ALLERGIES:  Allergies  Allergen Reactions  . Cefazolin Other (See Comments)    Reaction not listed on MAR  . Cephalosporins   . Penicillins Other (See Comments)    Has patient had a PCN reaction causing immediate rash, facial/tongue/throat swelling, SOB or lightheadedness with hypotension: Unknown Has patient had a PCN reaction causing severe rash involving mucus membranes or skin necrosis: Unknown Has patient had a PCN reaction that required hospitalization: Unknown Has patient had a PCN reaction occurring within the last 10 years: Unknown If all of the above answers are "NO", then may proceed with Cephalosporin use.     REVIEW OF SYSTEMS:   CONSTITUTIONAL: No fever,+ fatigue / weakness.  EYES: No blurred or double vision.  EARS, NOSE, AND THROAT: No tinnitus or ear pain.  RESPIRATORY: + cough, +shortness of breath, no wheezing or hemoptysis.  CARDIOVASCULAR: No chest pain, orthopnea, edema.  GASTROINTESTINAL: No nausea, vomiting, diarrhea or abdominal pain.  GENITOURINARY: No dysuria, hematuria.  ENDOCRINE: No polyuria, nocturia,  HEMATOLOGY: No anemia, easy bruising or bleeding SKIN: No rash or lesion. MUSCULOSKELETAL: No joint pain or arthritis.   NEUROLOGIC: No tingling, numbness, weakness.  PSYCHIATRY: No anxiety or depression.   MEDICATIONS AT HOME:  Prior to Admission medications   Medication Sig Start Date End Date Taking? Authorizing Provider  acetaminophen (TYLENOL) 325 MG tablet Take 650 mg by mouth every 4 (four) hours as needed.   Yes [provider]  ferrous sulfate 325 (65 FE) MG tablet Take 1 tablet by mouth daily.   Yes  [provider]  folic acid (FOLVITE) 1 MG tablet Take 1 mg by mouth daily.   Yes [provider]  gabapentin (NEURONTIN) 300 MG capsule Take 300 mg by mouth 3 (three) times daily.   Yes [provider]  indomethacin (INDOCIN) 50 MG capsule Take 50 mg by mouth 2 (two) times daily with  a meal.   Yes [provider]  lisinopril (PRINIVIL,ZESTRIL) 30 MG tablet Take 30 mg by mouth.    Yes [provider]  Multiple Vitamin (MULTIVITAMIN) tablet Take 1 tablet by mouth daily.   Yes [provider]  omeprazole (PRILOSEC) 20 MG capsule Take 1 capsule by mouth daily.   Yes [provider]  thiamine (VITAMIN B-1) 100 MG tablet Take 100 mg by mouth daily.   Yes [provider]  amLODipine (NORVASC) 10 MG tablet Take 10 mg by mouth at bedtime.    [provider]  simvastatin (ZOCOR) 10 MG tablet Take 1 tablet by mouth daily.    [provider]  traMADol (ULTRAM) 50 MG tablet Take 50 mg by mouth 2 (two) times daily as needed.    [provider]      PHYSICAL EXAMINATION:   VITAL SIGNS: Blood pressure 94/68, pulse 69, temperature 98.3 F (36.8 C), temperature source Oral, resp. rate 14, height 5\' 10"  (1.778 m), weight 68 kg (150 lb), SpO2 95 %.  GENERAL:  62 y.o.-year-old patient lying in the bed with no acute distress.  Frail-appearing, disheveled EYES: Pupils equal, round, reactive to light and accommodation. No scleral icterus. Extraocular muscles intact.  HEENT: Head atraumatic, normocephalic. Oropharynx and nasopharynx clear.  NECK:  Supple, no jugular venous distention. No thyroid enlargement, no tenderness.  LUNGS: Mildly diminished breath sounds with wheezing. No use of accessory muscles of respiration.  CARDIOVASCULAR: S1, S2 normal. No murmurs, rubs, or gallops.  ABDOMEN: Soft, nontender, nondistended. Bowel sounds present. No organomegaly or mass.  EXTREMITIES: No pedal edema, cyanosis, or clubbing.  Right AKA  nEUROLOGIC: Cranial nerves II through XII are intact. MAES. Sensation intact. Gait not checked.  PSYCHIATRIC: The patient is alert and oriented x 3.  SKIN: Stage II sacral/ischial decubitus wounds appear noninfected   LABORATORY PANEL:   CBC Recent Labs  Lab 02/09/18 1126  WBC 5.3  HGB  8.1*  HCT 23.8*  PLT 194  MCV 86.1  MCH 29.4  MCHC 34.1  RDW 16.4*  LYMPHSABS 0.5*  MONOABS 0.4  EOSABS 0.1  BASOSABS 0.0   ------------------------------------------------------------------------------------------------------------------  Chemistries  Recent Labs  Lab 02/09/18 1126  NA 132*  K 4.4  CL 104  CO2 22  GLUCOSE 112*  BUN 16  CREATININE 1.00  CALCIUM 7.6*  AST 24  ALT 9  ALKPHOS 137*  BILITOT 0.7   ------------------------------------------------------------------------------------------------------------------ estimated creatinine clearance is 72.7 mL/min (by C-G formula based on SCr of 1 mg/dL). ------------------------------------------------------------------------------------------------------------------ No results for input(s): TSH, T4TOTAL, T3FREE, THYROIDAB in the last 72 hours.  Invalid input(s): FREET3   Coagulation profile No results for input(s): INR, PROTIME in the last 168 hours. ------------------------------------------------------------------------------------------------------------------- No results for input(s): DDIMER in the last 72 hours. -------------------------------------------------------------------------------------------------------------------  Cardiac Enzymes Recent Labs  Lab 02/09/18 1126  TROPONINI <0.03   ------------------------------------------------------------------------------------------------------------------ Invalid input(s): POCBNP  ---------------------------------------------------------------------------------------------------------------  Urinalysis    Component Value Date/Time   COLORURINE AMBER (A) 02/09/2018 1144   APPEARANCEUR CLOUDY (A) 02/09/2018 1144   APPEARANCEUR Clear 07/23/2014 1438   LABSPEC 1.015 02/09/2018 1144   LABSPEC 1.006 07/23/2014 1438   PHURINE  02/09/2018 1144    TEST NOT REPORTED DUE TO COLOR INTERFERENCE OF URINE PIGMENT   GLUCOSEU (A) 02/09/2018 1144    TEST NOT  REPORTED DUE TO COLOR INTERFERENCE OF URINE PIGMENT   GLUCOSEU Negative 07/23/2014 1438   HGBUR (A) 02/09/2018 1144    TEST NOT REPORTED DUE TO COLOR INTERFERENCE OF URINE PIGMENT   BILIRUBINUR (A) 02/09/2018 1144    TEST NOT REPORTED DUE TO COLOR INTERFERENCE OF URINE PIGMENT   BILIRUBINUR Negative 07/23/2014 1438   KETONESUR (A) 02/09/2018 1144    TEST NOT REPORTED DUE TO COLOR INTERFERENCE OF URINE PIGMENT   PROTEINUR (A) 02/09/2018 1144    TEST NOT REPORTED DUE TO COLOR INTERFERENCE OF URINE PIGMENT   NITRITE (A) 02/09/2018 1144    TEST NOT REPORTED DUE TO COLOR INTERFERENCE OF URINE PIGMENT   LEUKOCYTESUR (A) 02/09/2018 1144    TEST NOT REPORTED DUE TO COLOR INTERFERENCE OF URINE PIGMENT   LEUKOCYTESUR 3+ 07/23/2014 1438     RADIOLOGY: Dg Chest Port 1 View  Result Date: 02/09/2018 CLINICAL DATA:  Hypotension.  Hypoxia. EXAM: PORTABLE CHEST 1 VIEW COMPARISON:  03/12/2017. FINDINGS: Cardiomegaly with mild pulmonary vascular prominence. Left base atelectasis/infiltrate with small left pleural effusion. No pneumothorax. Plate and screw fixation of the left humerus. Plate and screw fixation of the cervical spine. Stable deformity right humerus. Postsurgical changes left humerus. IMPRESSION: 1. Left base atelectasis/infiltrate with small left pleural effusion. 2.  Cardiomegaly with pulmonary venous congestion. Electronically Signed   By: Maisie Fushomas  Register   On: 02/09/2018 12:05    EKG: Orders placed or performed during the hospital encounter of 02/09/18  . ED EKG 12-Lead  . ED EKG 12-Lead  . EKG 12-Lead  . EKG 12-Lead    IMPRESSION AND PLAN: *Acute septic shock secondary to community acquired pneumonia and probable UTI Failed fluid resuscitation, compounded by severe anemia, guaiac positive Admit to stepdown unit, case discussed with intensivist, continue sepsis shot protocol.  Vancomycin/Levaquin, follow-up on cultures, IV fluids rehydration, Levophed drip with tapering as  tolerated  *Acute community-acquired pneumonia Antibiotics per above supplemental oxygen wean as tolerated, follow-up on cultures  *Acute probable UTI Urinalysis grossly abnormal Check urine cultures, antibiotics per above  *Acute symptomatic anemia Most likely secondary to acute blood loss, patient guaiac positive Transfuse 2 units packed red blood cells, gastroenterology to see, check anemia work-up, Protonix drip, CBC daily, strict I&O monitoring, transfuse as needed  *Chronic diabetes mellitus type 2 Sliding scale insulin Accu-Cheks per routine  *Chronic stage II sacral/ischial decubitus wounds Appear noninfected Wound care nurse consulted to evaluate/treat Continue local wound care  *Acute hyponatremia Replete with IV fluids rehydration, BMP in the morning   All the records are reviewed and case discussed with ED provider. Management plans discussed with the patient, family and they are in agreement.  CODE STATUS:full Code Status History    Date Active Date Inactive Code Status Order ID Comments User Context   03/09/2017 2334 03/13/2017 1900 Full Code [id number]  Ramonita LabGouru, Aruna, MD Inpatient       TOTAL TIME TAKING CARE OF THIS PATIENT: 45 minutes.    Evelena AsaMontell D Elizabet Schweppe M.D on 02/09/2018   Between 7am to 6pm - Pager - [phone]  After 6pm go to [email or web address] - password [email or web address]  [facility] [place] [facility]  Office  [phone]  CC: Primary care physician; Armando GangLindley, Cheryl P, FNP   Note: This dictation was prepared with Dragon dictation along with smaller phrase technology. Any transcriptional errors that result from  this process are unintentional.

## 2018-02-10 DIAGNOSIS — D649 Anemia, unspecified: Secondary | ICD-10-CM

## 2018-02-10 LAB — FOLATE: FOLATE: 50.4 ng/mL (ref >5.9)

## 2018-02-10 LAB — THYROID PANEL WITH TSH
FREE THYROXINE INDEX: 1.4 (ref 1.2–4.9)
T3 UPTAKE RATIO: 30 % (ref 24–39)
T4 TOTAL: 4.6 ug/dL (ref 4.5–12.0)
TSH: 2.04 u[IU]/mL (ref 0.450–4.500)

## 2018-02-10 LAB — CBC
HEMATOCRIT: 22.4 % — AB (ref 40.0–52.0)
Hemoglobin: 7.3 g/dL — ABNORMAL LOW (ref 13.0–18.0)
MCH: 28.3 pg (ref 26.0–34.0)
MCHC: 32.6 g/dL (ref 32.0–36.0)
MCV: 86.9 fL (ref 80.0–100.0)
Platelets: 182 10*3/uL (ref 150–440)
RBC: 2.58 MIL/uL — ABNORMAL LOW (ref 4.40–5.90)
RDW: 16.2 % — AB (ref 11.5–14.5)
WBC: 2.6 10*3/uL — ABNORMAL LOW (ref 3.8–10.6)

## 2018-02-10 LAB — IRON AND TIBC
Iron: 35 ug/dL — ABNORMAL LOW (ref 45–182)
SATURATION RATIOS: 21 % (ref 17.9–39.5)
TIBC: 171 ug/dL — ABNORMAL LOW (ref 250–450)
UIBC: 136 ug/dL

## 2018-02-10 LAB — BASIC METABOLIC PANEL
Anion gap: 3 — ABNORMAL LOW (ref 5–15)
BUN: 9 mg/dL (ref 8–23)
CO2: 21 mmol/L — AB (ref 22–32)
Calcium: 6.5 mg/dL — ABNORMAL LOW (ref 8.9–10.3)
Chloride: 116 mmol/L — ABNORMAL HIGH (ref 98–111)
Creatinine, Ser: 0.66 mg/dL (ref 0.61–1.24)
GFR calc Af Amer: >60 mL/min (ref >60)
GLUCOSE: 169 mg/dL — AB (ref 70–99)
POTASSIUM: 3.2 mmol/L — AB (ref 3.5–5.1)
Sodium: 140 mmol/L (ref 135–145)

## 2018-02-10 LAB — FERRITIN: Ferritin: 89 ng/mL (ref 24–336)

## 2018-02-10 LAB — VITAMIN B12: VITAMIN B 12: 434 pg/mL (ref 180–914)

## 2018-02-10 LAB — EXPECTORATED SPUTUM ASSESSMENT W GRAM STAIN, RFLX TO RESP C

## 2018-02-10 LAB — GLUCOSE, CAPILLARY
GLUCOSE-CAPILLARY: 160 mg/dL — AB (ref 70–99)
GLUCOSE-CAPILLARY: 106 mg/dL — AB (ref 70–99)
Glucose-Capillary: 153 mg/dL — ABNORMAL HIGH (ref 70–99)
Glucose-Capillary: 126 mg/dL — ABNORMAL HIGH (ref 70–99)
Glucose-Capillary: 193 mg/dL — ABNORMAL HIGH (ref 70–99)

## 2018-02-10 LAB — MAGNESIUM: MAGNESIUM: 1.3 mg/dL — AB (ref 1.7–2.4)

## 2018-02-10 LAB — PREPARE RBC (CROSSMATCH)

## 2018-02-10 LAB — TROPONIN I

## 2018-02-10 LAB — PROCALCITONIN: Procalcitonin: <0.1 ng/mL

## 2018-02-10 LAB — HIV ANTIBODY (ROUTINE TESTING W REFLEX): HIV SCREEN 4TH GENERATION: NONREACTIVE

## 2018-02-10 MED ORDER — SODIUM CHLORIDE 0.9% IV SOLUTION
Freq: Once | INTRAVENOUS | Status: AC
  Administered 2018-02-10: 17:00:00 via INTRAVENOUS

## 2018-02-10 MED ORDER — IPRATROPIUM-ALBUTEROL 0.5-2.5 (3) MG/3ML IN SOLN
3.0000 mL | Freq: Three times a day (TID) | RESPIRATORY_TRACT | Status: DC
  Filled 2018-02-10 (×2): qty 3

## 2018-02-10 MED ORDER — FAMOTIDINE 20 MG PO TABS
20.0000 mg | ORAL_TABLET | Freq: Two times a day (BID) | ORAL | Status: DC
  Administered 2018-02-10 – 2018-02-13 (×6): 20 mg via ORAL
  Filled 2018-02-10 (×6): qty 1

## 2018-02-10 MED ORDER — LEVOFLOXACIN 750 MG PO TABS
750.0000 mg | ORAL_TABLET | Freq: Every day | ORAL | Status: DC
  Administered 2018-02-11 – 2018-02-12 (×2): 750 mg via ORAL
  Filled 2018-02-10 (×2): qty 1

## 2018-02-10 MED ORDER — POTASSIUM CHLORIDE CRYS ER 20 MEQ PO TBCR
40.0000 meq | EXTENDED_RELEASE_TABLET | Freq: Once | ORAL | Status: AC
  Administered 2018-02-10: 15:00:00 40 meq via ORAL
  Filled 2018-02-10: qty 2

## 2018-02-10 NOTE — Progress Notes (Signed)
PHARMACIST - PHYSICIAN COMMUNICATION DR:   Sherryll BurgerShah CONCERNING: Antibiotic IV to Oral Route Change Policy  RECOMMENDATION: This patient is receiving levofloxacin by the intravenous route.  Based on criteria approved by the Pharmacy and Therapeutics Committee, the antibiotic(s) is/are being converted to the equivalent oral dose form(s).   DESCRIPTION: These criteria include:  Patient being treated for a respiratory tract infection, urinary tract infection, cellulitis or clostridium difficile associated diarrhea if on metronidazole  The patient is not neutropenic and does not exhibit a GI malabsorption state  The patient is eating (either orally or via tube) and/or has been taking other orally administered medications for a least 24 hours  The patient is improving clinically and has a Tmax < 100.5  If you have questions about this conversion, please contact the Pharmacy Department  []   [phone]( [phone] )  [facility] [x]   [phone]( [phone] )  [facility] []   [phone]( [phone] )  [name] []   [phone]( [phone] )  [facility] []   [phone]( [phone] )  [facility]   Zebediah Beezley A. [place], [place]arm.D., BCPS Clinical Pharmacist 02/10/2018 11:03

## 2018-02-10 NOTE — Plan of Care (Signed)

## 2018-02-10 NOTE — Progress Notes (Signed)
PHARMACIST - PHYSICIAN COMMUNICATION  DR:   Sherryll BurgerShah  CONCERNING: IV to Oral Route Change Policy  RECOMMENDATION: This patient is receiving famotidine by the intravenous route.  Based on criteria approved by the Pharmacy and Therapeutics Committee, the intravenous medication(s) is/are being converted to the equivalent oral dose form(s).   DESCRIPTION: These criteria include:  The patient is eating (either orally or via tube) and/or has been taking other orally administered medications for a least 24 hours  The patient has no evidence of active gastrointestinal bleeding or impaired GI absorption (gastrectomy, short bowel, patient on TNA or NPO).  If you have questions about this conversion, please contact the Pharmacy Department  []   [phone]( [phone] )  [name] [x]   [phone]( [phone] )  [name] []   [phone]( [phone] )  [name] []   [phone]( [phone]  [name] []   [phone]( [phone] )  [name]   [name], [facility] Grande HospitalRPH 02/10/2018 11:04 AM

## 2018-02-10 NOTE — Progress Notes (Signed)
Oral temp down to 98.6

## 2018-02-10 NOTE — Progress Notes (Signed)
[facility]. Patient alert and oriented. No complaints of pain. Picc line intact with no bleeding or hematoma. On 3 liters oxygenating 93%. Tolerating heart healthy diet. Using urinal. Orders for blood. MD unable to go over risks and benefits at this time. Advised stephanie that consent has not been obtained.

## 2018-02-10 NOTE — Progress Notes (Signed)
Spoke with Dr. Caryn BeeMaier in regards to oral temp 99.2 to 100.6, stated to give patient tylenol and monitor. Tylenol given. Patient appears to be in no distress at this time. No new orders at this time. Will continue to monitor.

## 2018-02-10 NOTE — Consult Note (Signed)
[name] , MD [address], [address], [place], [place], [address] [address], [address], [place], [place], [address] Phone: [phone]  Fax: [phone]  Consultation  Referring Provider:  Dr Sherryll Burger Primary Care Physician:  Armando Gang, FNP Primary Gastroenterologist:  Dr. Markham Jordan          Reason for Consultation:     GI bleed  Date of Admission:  02/09/2018 Date of Consultation:  02/10/2018         HPI:   [name] is a 62 y.o. male was admitted on 02/09/2018 with a past history of diverticulitis, status post colostomy, right AKA who lives in an assisted living facility.  He presented with shortness of breath hypertension and was diagnosed with a left-sided pneumonia.  It was also noted that his hemoglobin was down to 8.1 from a prior 11.9 g he was commenced on Levophed and admitted with a working diagnosis of acute septic shock secondary to community-acquired pneumonia possible UTI and symptomatic anemia.  His medications list includes iron tablets.  I have been consulted for his anemia.  He denies any nasal bleeds, hematemesis, melena, blood in the urine.  He denies any use of blood thinners, any use of NSAIDs.  Denies any abdominal pain.  He states that he is short of breath but is getting better.  He absolutely refuses to even consider endoscopy at any point of time.   CBC Latest Ref Rng & Units 02/10/2018 02/09/2018 05/31/2017  WBC 3.8 - 10.6 K/uL 2.6(L) 5.3 10.1  Hemoglobin 13.0 - 18.0 g/dL 7.3(L) 8.1(L) 11.9(L)  Hematocrit 40.0 - 52.0 % 22.4(L) 23.8(L) 35.4(L)  Platelets 150 - 440 K/uL 182 194 189   Iron/TIBC/Ferritin/ %Sat No results found for: IRON, TIBC, FERRITIN, IRONPCTSAT   Past Medical History:  Diagnosis Date  . Diabetes mellitus without complication (HCC)   . Hypertension     Past Surgical History:  Procedure Laterality Date  . ABOVE KNEE LEG AMPUTATION Right   . COLOSTOMY    . shoulder surgery      Prior to Admission medications   Medication  Sig Start Date End Date Taking? Authorizing Provider  acetaminophen (TYLENOL) 325 MG tablet Take 650 mg by mouth every 4 (four) hours as needed.   Yes [provider]  ferrous sulfate 325 (65 FE) MG tablet Take 1 tablet by mouth daily.   Yes [provider]  folic acid (FOLVITE) 1 MG tablet Take 1 mg by mouth daily.   Yes [provider]  gabapentin (NEURONTIN) 300 MG capsule Take 300 mg by mouth 3 (three) times daily.   Yes [provider]  indomethacin (INDOCIN) 50 MG capsule Take 50 mg by mouth 2 (two) times daily with a meal.   Yes [provider]  lisinopril (PRINIVIL,ZESTRIL) 30 MG tablet Take 30 mg by mouth.    Yes [provider]  Multiple Vitamin (MULTIVITAMIN) tablet Take 1 tablet by mouth daily.   Yes [provider]  omeprazole (PRILOSEC) 20 MG capsule Take 1 capsule by mouth daily.   Yes [provider]  thiamine (VITAMIN B-1) 100 MG tablet Take 100 mg by mouth daily.   Yes [provider]  amLODipine (NORVASC) 10 MG tablet Take 10 mg by mouth at bedtime.    [provider]  simvastatin (ZOCOR) 10 MG tablet Take 1 tablet by mouth daily.    [provider]  traMADol (ULTRAM) 50 MG tablet Take 50 mg by mouth 2 (two) times  daily as needed.    [provider]    No family history on file.   Social History   Tobacco Use  . Smoking status: Current Every Day Smoker    Packs/day: 0.50    Types: Cigarettes  . Smokeless tobacco: Former Engineer, water Use Topics  . Alcohol use: Yes    Comment: "when I can"  . Drug use: No    Allergies as of 02/09/2018 - Review Complete 02/09/2018  Allergen Reaction Noted  . Cefazolin Other (See Comments) 02/09/2018  . Cephalosporins  03/09/2017  . Penicillins Other (See Comments) 03/09/2017    Review of Systems:    All systems reviewed and negative except where noted in HPI.   Physical Exam:  Vital signs in last 24 hours: Temp:   [98.2 F (36.8 C)-98.6 F (37 C)] 98.6 F (37 C) (08/03 0100) Pulse Rate:  [55-102] 89 (08/03 0600) Resp:  [14-25] 17 (08/03 0600) BP: (63-106)/(35-88) 87/66 (08/03 0600) SpO2:  [77 %-100 %] 95 % (08/03 0600) Weight:  [130 lb 4.7 oz (59.1 kg)-150 lb (68 kg)] 130 lb 4.7 oz (59.1 kg) (08/02 1630)   General:   Pleasant, cooperative in NAD, on oxygen at 45 degree angle incline in the bed.  Appears mildly short of breath. Head:  Normocephalic and atraumatic. Eyes:   No icterus.   Conjunctiva pink. PERRLA. Ears:  Normal auditory acuity. Neck:  Supple; no masses or thyroidomegaly Lungs: Decreased air entry bilaterally no added breath sounds. Heart:  Regular rate and rhythm;  Without murmur, clicks, rubs or gallops Abdomen:  Soft, nondistended, nontender. Normal bowel sounds. No appreciable masses or hepatomegaly.  No rebound or guarding.  Left lower quadrant colostomy bags with green stool noted Neurologic:  Alert and oriented x3;  grossly normal neurologically.  Right below-knee amputation. Skin:  Intact without significant lesions or rashes. Cervical Nodes:  No significant cervical adenopathy. Psych:  Alert and cooperative. Normal affect.  LAB RESULTS: Recent Labs    02/09/18 1126 02/10/18 0524  WBC 5.3 2.6*  HGB 8.1* 7.3*  HCT 23.8* 22.4*  PLT 194 182   BMET Recent Labs    02/09/18 1126 02/10/18 0524  NA 132* 140  K 4.4 3.2*  CL 104 116*  CO2 22 21*  GLUCOSE 112* 169*  BUN 16 9  CREATININE 1.00 0.66  CALCIUM 7.6* 6.5*   LFT Recent Labs    02/09/18 1126  PROT 6.2*  ALBUMIN 2.0*  AST 24  ALT 9  ALKPHOS 137*  BILITOT 0.7   PT/INR No results for input(s): LABPROT, INR in the last 72 hours.  STUDIES: Dg Chest Port 1 View  Result Date: 02/09/2018 CLINICAL DATA:  Hypotension.  Hypoxia. EXAM: PORTABLE CHEST 1 VIEW COMPARISON:  03/12/2017. FINDINGS: Cardiomegaly with mild pulmonary vascular prominence. Left base atelectasis/infiltrate with small left pleural  effusion. No pneumothorax. Plate and screw fixation of the left humerus. Plate and screw fixation of the cervical spine. Stable deformity right humerus. Postsurgical changes left humerus. IMPRESSION: 1. Left base atelectasis/infiltrate with small left pleural effusion. 2.  Cardiomegaly with pulmonary venous congestion. Electronically Signed   By: Maisie Fus  Register   On: 02/09/2018 12:05   [place] Ekg Site Rite  Result Date: 02/09/2018 If Site Rite image not attached, placement could not be confirmed due to current cardiac rhythm.  [place] Ekg Site Rite  Result Date: 02/09/2018 If Site Rite image not attached, placement could not be confirmed due to current cardiac rhythm.  Impression / Plan:   Collene SchlichterRobert Arenivas is a 62 y.o. y/o male admitted with septic shock secondary to pneumonia, UTI.  Incidentally was found to have a hemoglobin which was below his prior baseline from November 2018.  His present labs suggest he had a normocytic anemia.  No overt bleeding has been noted any other staff members.  Stool occult blood positive does not indicate an acute GI bleed.  Positive test only suggest that the sample of stool has blood in it or is positive from other causes such as oral iron. He would require endoscopic evaluation for the anemia when he has recovered from his pneumonia and is safe to undergo anesthesia which would be electively in a few weeks time.    In the meanwhile if he shows overt signs of bleeding in the form of hematemesis melena or hematochezia then would need endoscopy sooner.  I would also  suggest to continue PPI avoid any NSAIDs, check iron studies B12 folate, check urine for any blood losses and transfuse as appropriate.  His overnight drop in hemoglobin likely secondary to fluid resuscitation.  He absolutely refuses to consider endoscopy in the future as well.  I did explain to him that if there was any overt signs of bleeding endoscopy should be considered an option but he absolutely does not  want to have it done.  Thank you for involving me in the care of this patient.      LOS: 1 day   Shawn MoodKiran Dameisha Tschida, MD  02/10/2018, 8:44 AM

## 2018-02-10 NOTE — Progress Notes (Signed)
[employer] - [place] [facility]   PATIENT NAME: Shawn SchlichterRobert Higgins    MR#:  [id number]  DATE OF BIRTH:  01/14/1956  SUBJECTIVE:  CHIEF COMPLAINT:   Chief Complaint  Patient presents with  . Hypotension  BP improving.  REVIEW OF SYSTEMS:  Review of Systems  Constitutional: Positive for malaise/fatigue. Negative for chills, fever and weight loss.  HENT: Negative for nosebleeds and sore throat.   Eyes: Negative for blurred vision.  Respiratory: Negative for cough, shortness of breath and wheezing.   Cardiovascular: Negative for chest pain, orthopnea, leg swelling and PND.  Gastrointestinal: Negative for abdominal pain, constipation, diarrhea, heartburn, nausea and vomiting.  Genitourinary: Negative for dysuria and urgency.  Musculoskeletal: Negative for back pain.  Skin: Negative for rash.  Neurological: Negative for dizziness, speech change, focal weakness and headaches.  Endo/Heme/Allergies: Does not bruise/bleed easily.  Psychiatric/Behavioral: Negative for depression.   DRUG ALLERGIES:   Allergies  Allergen Reactions  . Cefazolin Other (See Comments)    Reaction not listed on MAR  . Cephalosporins   . Penicillins Other (See Comments)    Has patient had a PCN reaction causing immediate rash, facial/tongue/throat swelling, SOB or lightheadedness with hypotension: Unknown Has patient had a PCN reaction causing severe rash involving mucus membranes or skin necrosis: Unknown Has patient had a PCN reaction that required hospitalization: Unknown Has patient had a PCN reaction occurring within the last 10 years: Unknown If all of the above answers are "NO", then may proceed with Cephalosporin use.    VITALS:  Blood pressure 107/78, pulse 91, temperature 99.5 F (37.5 C), temperature source Oral, resp. rate 16, height 5\' 10"  (1.778 m), weight 59.1 kg (130 lb 4.7 oz), SpO2 95 %. PHYSICAL EXAMINATION:  Physical Exam  Constitutional: He is oriented to person,  place, and time.  HENT:  Head: Normocephalic and atraumatic.  Eyes: Pupils are equal, round, and reactive to light. Conjunctivae and EOM are normal.  Neck: Normal range of motion. Neck supple. No tracheal deviation present. No thyromegaly present.  Cardiovascular: Normal rate, regular rhythm and normal heart sounds.  Pulmonary/Chest: Effort normal and breath sounds normal. No respiratory distress. He has no wheezes. He exhibits no tenderness.  Abdominal: Soft. Bowel sounds are normal. He exhibits no distension. There is no tenderness.  Musculoskeletal: Normal range of motion.  Neurological: He is alert and oriented to person, place, and time. No cranial nerve deficit.  Skin: Skin is warm and dry. No rash noted.   LABORATORY PANEL:  Male CBC Recent Labs  Lab 02/10/18 0524  WBC 2.6*  HGB 7.3*  HCT 22.4*  PLT 182   ------------------------------------------------------------------------------------------------------------------ Chemistries  Recent Labs  Lab 02/09/18 1126 02/10/18 0524  NA 132* 140  K 4.4 3.2*  CL 104 116*  CO2 22 21*  GLUCOSE 112* 169*  BUN 16 9  CREATININE 1.00 0.66  CALCIUM 7.6* 6.5*  AST 24  --   ALT 9  --   ALKPHOS 137*  --   BILITOT 0.7  --    RADIOLOGY:  [place] Ekg Site Rite  Result Date: 02/09/2018 If Site Rite image not attached, placement could not be confirmed due to current cardiac rhythm.  [place] Ekg Site Rite  Result Date: 02/09/2018 If Site Rite image not attached, placement could not be confirmed due to current cardiac rhythm.  ASSESSMENT AND PLAN:  62 y.o. male with a known history per below which also includes diverticulitis, status post colostomy, chronic ischio/sacral wound stage II,  right AKA presenting from assisted living facility with generalized weakness, hypotension, shortness of breath O2 saturation on presentation was 85% admitted for septic shock  *Acute septic shock secondary to community acquired pneumonia and UTI Failed fluid  resuscitation and required pressors initially, compounded by severe anemia, guaiac positive -  continue Levaquin, vanco stopped as MRSA eng. follow-up on cultures, IV fluids rehydration, Levophed drip stopped  *Acute community-acquired pneumonia Antibiotics per above supplemental oxygen wean as tolerated, follow-up on cultures  *Acute UTI: based on UA Urinalysis grossly abnormal - await urine cultures, antibiotics per above  *Acute symptomatic anemia: 8.1 -> 7.3 (was 11.9 in nov 2018) - could be slow GI blood loss, patient guaiac positive - gastroenterology c/s - Doubt any luminal eval this admission, on Pepcid BID, CBC daily, strict I&O monitoring, transfuse as needed  *Chronic diabetes mellitus type 2 Sliding scale insulin Accu-Cheks per routine  *Chronic stage II sacral/ischial decubitus wounds Appear noninfected Wound care nurse consulted to evaluate/treat Continue local wound care  *Acute hyponatremia: resolved with IVFs - BMP in the morning  * Hypokalemia - replete and recheck  * Leukopenia - could be due to infection with underlying liver dz - monitor    All the records are reviewed and case discussed with Care Management/Social Worker. Management plans discussed with the patient, nursing and they are in agreement.  CODE STATUS: Full Code  TOTAL TIME TAKING CARE OF THIS PATIENT: 35 minutes.   More than 50% of the time was spent in counseling/coordination of care: YES  POSSIBLE D/C IN 2-3 DAYS, DEPENDING ON CLINICAL CONDITION.   Delfino Lovett M.D on 02/10/2018 at 12:11 PM  Between 7am to 6pm - Pager - [phone]  After 6pm go to [email or web address] - Social research officer, government  [employer]  Hospitalists  Office  [phone]  CC: Primary care physician; Armando Gang, FNP  Note: This dictation was prepared with Dragon dictation along with smaller phrase technology. Any transcriptional errors that result from this process are unintentional.

## 2018-02-10 NOTE — Clinical Social Work Note (Signed)
CSW aware that [facility]. CSW will assess when able.  Argentina PonderKaren Martha Kaysea Raya, MSW, Theresia MajorsLCSWA [phone]

## 2018-02-10 NOTE — Progress Notes (Signed)
Follow up - Critical Care Medicine Note  Patient Details:    [name] is an 62 y.o. male.with a past medical history of hypertension, diabetes, peripheral arterial disease, status post AKA, diverticulitis, status post colostomy, lives in assisted living, presented to the emergency department with complaints of shortness of breath. He was found to be significantly hypotensive and code sepsis was called. He was started on fluid resuscitation, antibiotics were given, Levaquin, vancomycin, he was started on norepinephrine. Probable source include both urine and lungs. Patient is also noted to have decubitus wounds over sacrum and buttock area  Lines, Airways, Drains: PICC Triple Lumen 02/09/18 PICC Right Brachial 39 cm 0 cm (Active)  Indication for Insertion or Continuance of Line Poor Vasculature-patient has had multiple peripheral attempts or PIVs lasting less than 24 hours 02/10/2018  8:00 AM  Exposed Catheter (cm) 0 cm 02/09/2018  8:02 PM  Site Assessment Bleeding 02/09/2018  8:02 PM  Lumen #1 Status Blood return noted;Flushed;Saline locked 02/09/2018  8:02 PM  Lumen #2 Status Blood return noted;Flushed;Saline locked 02/09/2018  8:02 PM  Lumen #3 Status Blood return noted;Flushed;Saline locked 02/09/2018  8:02 PM  Dressing Type Transparent;Securing device 02/09/2018  8:02 PM  Dressing Status Clean;Dry;Intact;Antimicrobial disc in place 02/09/2018  8:02 PM  Dressing Intervention New dressing 02/09/2018  8:02 PM  Dressing Change Due 02/11/18 02/09/2018  8:02 PM     Colostomy LUQ (Active)     External Urinary Catheter (Active)    Anti-infectives:  Anti-infectives (From admission, onward)   Start     Dose/Rate Route Frequency Ordered Stop   02/10/18 1000  levofloxacin (LEVAQUIN) IVPB 750 mg     750 mg 100 mL/hr over 90 Minutes Intravenous Every 24 hours 02/09/18 1626 02/17/18 0959   02/09/18 2200  vancomycin (VANCOCIN) IVPB 1000 mg/200 mL premix     1,000 mg 200 mL/hr over 60 Minutes Intravenous Every  12 hours 02/09/18 1517     02/09/18 1515  vancomycin (VANCOCIN) IVPB 1000 mg/200 mL premix     1,000 mg 200 mL/hr over 60 Minutes Intravenous  Once 02/09/18 1513 02/09/18 1744   02/09/18 1245  levofloxacin (LEVAQUIN) IVPB 750 mg     750 mg 100 mL/hr over 90 Minutes Intravenous  Once 02/09/18 1231 02/09/18 1405   02/09/18 1245  aztreonam (AZACTAM) 2 g in sodium chloride 0.9 % 100 mL IVPB     2 g 200 mL/hr over 30 Minutes Intravenous  Once 02/09/18 1231 02/09/18 1320      Microbiology: Results for orders placed or performed during the hospital encounter of 02/09/18  Blood Culture (routine x 2)     Status: None (Preliminary result)   Collection Time: 02/09/18 11:26 AM  Result Value Ref Range Status   Specimen Description BLOOD BLOOD RIGHT FOREARM  Final   Special Requests   Final    BOTTLES DRAWN AEROBIC AND ANAEROBIC Blood Culture adequate volume   Culture   Final    NO GROWTH < 24 HOURS Performed at [facility] [address] [place], [place] [address]    Report Status PENDING  Incomplete  Blood Culture (routine x 2)     Status: None (Preliminary result)   Collection Time: 02/09/18 11:26 AM  Result Value Ref Range Status   Specimen Description BLOOD RIGHT ARM  Final   Special Requests   Final    BOTTLES DRAWN AEROBIC AND ANAEROBIC Blood Culture adequate volume   Culture   Final    NO GROWTH < 24  HOURS Performed at [facility], [address] [place], [place] [address]    Report Status PENDING  Incomplete  MRSA PCR Screening     Status: None   Collection Time: 02/09/18  4:25 PM  Result Value Ref Range Status   MRSA by PCR NEGATIVE NEGATIVE Final    Comment:        The GeneXpert MRSA Assay (FDA approved for NASAL specimens only), is one component of a comprehensive MRSA colonization surveillance program. It is not intended to diagnose MRSA infection nor to guide or monitor treatment for MRSA infections. Performed at [facility], [address] [place], [place] [address]   Culture, blood (routine x 2) Call MD if unable to obtain prior to antibiotics being given     Status: None (Preliminary result)   Collection Time: 02/09/18  4:55 PM  Result Value Ref Range Status   Specimen Description BLOOD BLOOD RIGHT WRIST  Final   Special Requests   Final    BOTTLES DRAWN AEROBIC AND ANAEROBIC Blood Culture adequate volume   Culture   Final    NO GROWTH < 12 HOURS Performed at [facility], [address] [place], [place] [address]    Report Status PENDING  Incomplete  Culture, sputum-assessment     Status: None   Collection Time: 02/10/18  3:06 AM  Result Value Ref Range Status   Specimen Description EXPECTORATED SPUTUM  Final   Special Requests NONE  Final   Sputum evaluation   Final    Sputum specimen not acceptable for testing.  Please recollect.   C/RENEE BABB AT 0525 02/10/18.PMH Performed at [facility], [address], [place], [place] [address]    Report Status 02/10/2018 FINAL  Final   Studies: Dg Chest Port 1 View  Result Date: 02/09/2018 CLINICAL DATA:  Hypotension.  Hypoxia. EXAM: PORTABLE CHEST 1 VIEW COMPARISON:  03/12/2017. FINDINGS: Cardiomegaly with mild pulmonary vascular prominence. Left base atelectasis/infiltrate with small left pleural effusion. No pneumothorax. Plate and screw fixation of the left humerus. Plate and screw fixation of the cervical spine. Stable deformity right humerus. Postsurgical changes left humerus. IMPRESSION: 1. Left base atelectasis/infiltrate with small left pleural effusion. 2.  Cardiomegaly with pulmonary venous congestion. Electronically Signed   By: Maisie Fus  Register   On: 02/09/2018 12:05   [place] Ekg Site Rite  Result Date: 02/09/2018 If Site Rite image not attached, placement could not be confirmed due to current cardiac rhythm.  [place] Ekg Site Rite  Result Date: 02/09/2018 If Site Rite image not attached, placement could not be confirmed due to current  cardiac rhythm.   Consults: Treatment Team:  Pccm, Raymond Gurney, MD Wyline Mood, MD   Subjective:    Overnight Issues: Overnight Mr. Reckner has done quite well. Has been weaned successfully off of pressors Has been eating and voices no complaints. Resting comfortably on nasal cannula oxygen  Objective:  Vital signs for last 24 hours: Temp:  [98.2 F (36.8 C)-98.6 F (37 C)] 98.6 F (37 C) (08/03 0100) Pulse Rate:  [55-102] 89 (08/03 0600) Resp:  [14-25] 17 (08/03 0600) BP: (63-106)/(35-88) 87/66 (08/03 0600) SpO2:  [77 %-100 %] 95 % (08/03 0600) Weight:  [130 lb 4.7 oz (59.1 kg)-150 lb (68 kg)] 130 lb 4.7 oz (59.1 kg) (08/02 1630)  Hemodynamic parameters for last 24 hours:    Intake/Output from previous day: 08/02 0701 - 08/03 0700 In: 2816.2 [I.V.:1504.6; IV Piggyback:1311.7] Out: 2300 [Urine:2300]  Intake/Output this shift: No  intake/output data recorded.  Vent settings for last 24 hours:    Physical Exam:   Vital signs:       Please see the above listed vital signs HEENT:           No oral lesions appreciated, trachea midline, no accessory muscle utilization Cardiovascular:           regular rate and rhythm, pulse rate in the 70s            Pulmonary:      Clear to auscultation anteriorly, crackles appreciated in the left posterior lung zone Abdominal:      Positive bowel sounds, soft exam Extremities:     Right AKA Neurologic:      Patient moves all extremities Obvious focal deficits noted Cutaneous:      Decubitus ulcer noted sacrum and buttocks,will consult wound care    Assessment/Plan:     Septic shock. Possible sources include left lower lobe pneumonia nd urinary tract infection. Presently on Levaquin and vancomycin. Has been successfully weaned off of pressors. Has a right-sided PICC line for access. Thyroid panel was within normal range, is empirically being covered with stress dose steroids   Anemia. Patient's last hemoglobin was 11.9, now down  to 8.1. Now 7.3. No clear evidence of active bleeding, has received multiple liters of saline resuscitation and some of this may be delusional. We'll give 1 unit of packed red blood cells, pending GI consultation  Peripheral vascular disease. Status post AKA  Diabetes mellitus. Sliding scale coverage  Decubitus. Patient with areas on his sacrum and buttocks, wound care nurse  At this point status has significantly improved, stable for transfer  Critical Care Total Time*: 30 Minutes  Kellyn Mccary 02/10/2018  *Care during the described time interval was provided by me and/or other providers on the critical care team.  I have reviewed this patient's available data, including medical history, events of note, physical examination and test results as part of my evaluation.

## 2018-02-11 DIAGNOSIS — D500 Iron deficiency anemia secondary to blood loss (chronic): Secondary | ICD-10-CM

## 2018-02-11 LAB — GLUCOSE, CAPILLARY
GLUCOSE-CAPILLARY: 90 mg/dL (ref 70–99)
Glucose-Capillary: 150 mg/dL — ABNORMAL HIGH (ref 70–99)
Glucose-Capillary: 122 mg/dL — ABNORMAL HIGH (ref 70–99)
Glucose-Capillary: 90 mg/dL (ref 70–99)

## 2018-02-11 LAB — CBC
HCT: 30 % — ABNORMAL LOW (ref 40.0–52.0)
Hemoglobin: 10.2 g/dL — ABNORMAL LOW (ref 13.0–18.0)
MCH: 29.2 pg (ref 26.0–34.0)
MCHC: 34 g/dL (ref 32.0–36.0)
MCV: 86 fL (ref 80.0–100.0)
Platelets: 200 10*3/uL (ref 150–440)
RBC: 3.49 MIL/uL — ABNORMAL LOW (ref 4.40–5.90)
RDW: 16.2 % — ABNORMAL HIGH (ref 11.5–14.5)
WBC: 5.2 10*3/uL (ref 3.8–10.6)

## 2018-02-11 LAB — BASIC METABOLIC PANEL
ANION GAP: 4 — AB (ref 5–15)
BUN: 13 mg/dL (ref 8–23)
CO2: 30 mmol/L (ref 22–32)
Calcium: 8 mg/dL — ABNORMAL LOW (ref 8.9–10.3)
Chloride: 105 mmol/L (ref 98–111)
Creatinine, Ser: 0.72 mg/dL (ref 0.61–1.24)
GFR calc Af Amer: >60 mL/min (ref >60)
GFR calc non Af Amer: >60 mL/min (ref >60)
Glucose, Bld: 172 mg/dL — ABNORMAL HIGH (ref 70–99)
POTASSIUM: 3 mmol/L — AB (ref 3.5–5.1)
SODIUM: 139 mmol/L (ref 135–145)

## 2018-02-11 LAB — ABO/RH: ABO/RH(D): B NEG

## 2018-02-11 LAB — POTASSIUM: Potassium: 3.8 mmol/L (ref 3.5–5.1)

## 2018-02-11 LAB — MAGNESIUM: Magnesium: 1.7 mg/dL (ref 1.7–2.4)

## 2018-02-11 LAB — PROCALCITONIN: Procalcitonin: <0.1 ng/mL

## 2018-02-11 MED ORDER — OCUVITE-LUTEIN PO CAPS
1.0000 | ORAL_CAPSULE | Freq: Every day | ORAL | Status: DC
  Administered 2018-02-11 – 2018-02-12 (×2): 1 via ORAL
  Filled 2018-02-11 (×3): qty 1

## 2018-02-11 MED ORDER — POTASSIUM CHLORIDE CRYS ER 20 MEQ PO TBCR
40.0000 meq | EXTENDED_RELEASE_TABLET | Freq: Two times a day (BID) | ORAL | Status: AC
  Administered 2018-02-11 (×2): 40 meq via ORAL
  Filled 2018-02-11 (×2): qty 2

## 2018-02-11 MED ORDER — PREMIER PROTEIN SHAKE
11.0000 [oz_av] | Freq: Two times a day (BID) | ORAL | Status: DC
  Administered 2018-02-13: 11:00:00 11 [oz_av] via ORAL

## 2018-02-11 MED ORDER — IPRATROPIUM-ALBUTEROL 0.5-2.5 (3) MG/3ML IN SOLN: 3.0000 mL | Freq: Four times a day (QID) | RESPIRATORY_TRACT | Status: DC | PRN

## 2018-02-11 MED ORDER — POTASSIUM CHLORIDE CRYS ER 20 MEQ PO TBCR
40.0000 meq | EXTENDED_RELEASE_TABLET | Freq: Once | ORAL | Status: AC
  Administered 2018-02-11: 08:00:00 40 meq via ORAL
  Filled 2018-02-11: qty 2

## 2018-02-11 NOTE — NC FL2 (Addendum)
[place] MEDICAID FL2 LEVEL OF CARE SCREENING TOOL     IDENTIFICATION  Patient Name: Shawn SchlichterRobert Higgins Birthdate: 1955-08-11 Sex: male Admission Date (Current Location): 02/09/2018  [place]  [place] [id number] [facility] Facility and Address:  [facility], [address], [place], [place] [address]      Provider Number: [id number]  Attending Physician Name and Address:  Delfino LovettShah, Vipul, MD  Relative Name and Phone Number:  [name] (son) [phone]    Current Level of Care: Hospital Recommended Level of Care: [facility] Prior Approval Number:    Date Approved/Denied:   PASRR Number:    Discharge Plan: Domiciliary (Rest home)    Current Diagnoses: Patient Active Problem List   Diagnosis Date Noted  . Iron deficiency anemia due to chronic blood loss   . Pressure injury of skin 03/10/2017  . Hyperkalemia   . Hyponatremia   . Septic shock (HCC)   . AKI (acute kidney injury) (HCC) 03/09/2017    Orientation RESPIRATION BLADDER Height & Weight     Self, Time, Situation, Place  Normal   Weight: 130 lb 4.7 oz (59.1 kg) Height:  5\' 10"  (177.8 cm)  BEHAVIORAL SYMPTOMS/MOOD NEUROLOGICAL BOWEL NUTRITION STATUS      Continent Diet(Heart healthy/Carb modified)  AMBULATORY STATUS COMMUNICATION OF NEEDS Skin   Extensive Assist Verbally PU Stage and Appropriate Care, Wound Vac                       Personal Care Assistance Level of Assistance  Bathing, Feeding, Dressing Bathing Assistance: Limited assistance Feeding assistance: Independent Dressing Assistance: Limited assistance     Functional Limitations Info  Sight, Hearing, Speech Sight Info: Adequate Hearing Info: Adequate Speech Info: Adequate    SPECIAL CARE FACTORS FREQUENCY                       Contractures Contractures Info: Not present    Additional Factors Info  Code Status, Allergies Code Status Info: Full Allergies Info: Cefazolin,  Cephalosporins, Penicillins           TAKE these medications   acetaminophen 325 MG tablet Commonly known as:  TYLENOL Take 650 mg by mouth every 4 (four) hours as needed.   ferrous sulfate 325 (65 FE) MG tablet Take 1 tablet by mouth daily.   folic acid 1 MG tablet Commonly known as:  FOLVITE Take 1 mg by mouth daily.   gabapentin 300 MG capsule Commonly known as:  NEURONTIN Take 300 mg by mouth 3 (three) times daily.   indomethacin 50 MG capsule Commonly known as:  INDOCIN Take 50 mg by mouth 2 (two) times daily with a meal.   multivitamin tablet Take 1 tablet by mouth daily.   nitrofurantoin (macrocrystal-monohydrate) 100 MG capsule Commonly known as:  MACROBID Take 1 capsule (100 mg total) by mouth every 12 (twelve) hours for 3 days.   omeprazole 20 MG capsule Commonly known as:  PRILOSEC Take 1 capsule by mouth daily.   simvastatin 10 MG tablet Commonly known as:  ZOCOR Take 1 tablet by mouth daily.   thiamine 100 MG tablet Commonly known as:  VITAMIN B-1 Take 100 mg by mouth daily.   traMADol 50 MG tablet Commonly known as:  ULTRAM Take 50 mg by mouth 2 (two) times daily as needed.      Discharge Medications: Please see discharge summary for a list of discharge medications.  Relevant Imaging Results:  Relevant  Lab Results:   Additional Information SS# [id number]  Judi Cong, LCSW

## 2018-02-11 NOTE — Progress Notes (Signed)
Midge Minium, MD [facility]   [address], [address] [place], [place] [address] Phone: [phone] Fax : [phone]   Subjective: The patient got 1 unit of blood last night and increase his hemoglobin from 7-10.3.  The patient has had no further sign of any GI bleeding.  The patient has a colostomy with passage of stool.  He denies any abdominal pain nausea vomiting fevers or chills.   Objective: Vital signs in last 24 hours: Vitals:   02/10/18 1900 02/10/18 1951 02/10/18 2204 02/11/18 0424  BP:  95/74 100/77 (!) 124/96  Pulse:  95 93 78  Resp:  17 16 16   Temp: 98.6 F (37 C) 99.5 F (37.5 C) 98.8 F (37.1 C) 98 F (36.7 C)  TempSrc:  Oral Oral Oral  SpO2:  97% 96% 98%  Weight:      Height:       Weight change:   Intake/Output Summary (Last 24 hours) at 02/11/2018 0755 Last data filed at 02/11/2018 0355 Gross per 24 hour  Intake 1110 ml  Output 2300 ml  Net -1190 ml     Exam: Heart:: Regular rate and rhythm, S1S2 present or without murmur or extra heart sounds Lungs: normal and clear to auscultation and percussion Abdomen: Soft nontender nondistended with a colostomy bag filled with air.   Lab Results: @LABTEST2 @ Micro Results: Recent Results (from the past 240 hour(s))  Blood Culture (routine x 2)     Status: None (Preliminary result)   Collection Time: 02/09/18 11:26 AM  Result Value Ref Range Status   Specimen Description BLOOD BLOOD RIGHT FOREARM  Final   Special Requests   Final    BOTTLES DRAWN AEROBIC AND ANAEROBIC Blood Culture adequate volume   Culture   Final    NO GROWTH 2 DAYS Performed at [facility], [address], [place], [place] [address]    Report Status PENDING  Incomplete  Blood Culture (routine x 2)     Status: None (Preliminary result)   Collection Time: 02/09/18 11:26 AM  Result Value Ref Range Status   Specimen Description BLOOD RIGHT ARM  Final   Special Requests   Final    BOTTLES DRAWN AEROBIC AND ANAEROBIC Blood Culture  adequate volume   Culture   Final    NO GROWTH 2 DAYS Performed at [facility], [address], [place], [place] [address]    Report Status PENDING  Incomplete  MRSA PCR Screening     Status: None   Collection Time: 02/09/18  4:25 PM  Result Value Ref Range Status   MRSA by PCR NEGATIVE NEGATIVE Final    Comment:        The GeneXpert MRSA Assay (FDA approved for NASAL specimens only), is one component of a comprehensive MRSA colonization surveillance program. It is not intended to diagnose MRSA infection nor to guide or monitor treatment for MRSA infections. Performed at [facility], [address] [place], [place] [address]   Culture, blood (routine x 2) Call MD if unable to obtain prior to antibiotics being given     Status: None (Preliminary result)   Collection Time: 02/09/18  4:55 PM  Result Value Ref Range Status   Specimen Description BLOOD BLOOD RIGHT WRIST  Final   Special Requests   Final    BOTTLES DRAWN AEROBIC AND ANAEROBIC Blood Culture adequate volume   Culture   Final    NO GROWTH 2 DAYS Performed at [facility], [address]  [place], [place] [address]    Report Status PENDING  Incomplete  Culture, sputum-assessment     Status: None   Collection Time: 02/10/18  3:06 AM  Result Value Ref Range Status   Specimen Description EXPECTORATED SPUTUM  Final   Special Requests NONE  Final   Sputum evaluation   Final    Sputum specimen not acceptable for testing.  Please recollect.   C/RENEE BABB AT 0525 02/10/18.PMH Performed at [facility], [address] [place], [place] [address]    Report Status 02/10/2018 FINAL  Final   Studies/Results: Dg Chest Port 1 View  Result Date: 02/09/2018 CLINICAL DATA:  Hypotension.  Hypoxia. EXAM: PORTABLE CHEST 1 VIEW COMPARISON:  03/12/2017. FINDINGS: Cardiomegaly with mild pulmonary vascular prominence. Left base atelectasis/infiltrate with small left pleural effusion. No  pneumothorax. Plate and screw fixation of the left humerus. Plate and screw fixation of the cervical spine. Stable deformity right humerus. Postsurgical changes left humerus. IMPRESSION: 1. Left base atelectasis/infiltrate with small left pleural effusion. 2.  Cardiomegaly with pulmonary venous congestion. Electronically Signed   By: Maisie Fushomas  Register   On: 02/09/2018 12:05   [place] Ekg Site Rite  Result Date: 02/09/2018 If Site Rite image not attached, placement could not be confirmed due to current cardiac rhythm.  [place] Ekg Site Rite  Result Date: 02/09/2018 If Site Rite image not attached, placement could not be confirmed due to current cardiac rhythm.  Medications: I have reviewed the patient's current medications. Scheduled Meds: . sodium chloride   Intravenous Once  . acetaminophen  650 mg Oral Once  . budesonide (PULMICORT) nebulizer solution  0.5 mg Nebulization BID  . diphenhydrAMINE  25 mg Oral Once  . famotidine  20 mg Oral BID  . ferrous sulfate  325 mg Oral Daily  . folic acid  1 mg Oral Daily  . gabapentin  300 mg Oral TID  . insulin aspart  0-15 Units Subcutaneous TID WC  . insulin aspart  0-5 Units Subcutaneous QHS  . levofloxacin  750 mg Oral Daily  . mouth rinse  15 mL Mouth Rinse BID  . multivitamin with minerals  1 tablet Oral Daily  . potassium chloride  40 mEq Oral BID  . potassium chloride  40 mEq Oral Once  . simvastatin  10 mg Oral Daily  . sodium chloride flush  3 mL Intravenous Q12H  . thiamine  100 mg Oral Daily   Continuous Infusions: PRN Meds:.acetaminophen **OR** acetaminophen, HYDROcodone-acetaminophen, ipratropium-albuterol, ondansetron **OR** ondansetron (ZOFRAN) IV, polyethylene glycol, sodium chloride flush   Assessment: Active Problems:   Septic shock (HCC)    Plan: This patient has heme positive stools and anemia.  The patient has had a blood transfusion and his hemoglobin is now 10.3.  As mentioned in Dr. Johnney KillianAnna's previous note the patient should  have a GI work-up as an outpatient.  Nothing further to do this patient as an inpatient.  If the patient's hemoglobin should drop or any signs of bleeding please contact us. I will sign off.  Please call if any further GI concerns or questions.  We would like to thank you for the opportunity to participate in the care of Collene SchlichterRobert Procell.     LOS: 2 days   Midge MiniumDarren Rosita Guzzetta 02/11/2018, 7:55 AM

## 2018-02-11 NOTE — Clinical Social Work Note (Signed)
Clinical Social Work Assessment  Patient Details  Name: [name] MRN: [id number] Date of Birth: 02-14-1956  Date of referral:  02/11/18               Reason for consult:  Facility Placement                Permission sought to share information with:  Chartered certified accountant granted to share information::  Yes, Verbal Permission Granted  Name::        Agency::  [facility]  Relationship::     Contact Information:     Housing/Transportation Living arrangements for the past 2 months:  [place] Information:  Patient, Medical Team, Facility Patient Interpreter Needed:  None Criminal Activity/Legal Involvement Pertinent to Current Situation/Hospitalization:  No - Comment as needed Significant Relationships:  Adult Children, Warehouse manager Lives with:  Facility Resident Do you feel safe going back to the place where you live?  Yes Need for family participation in patient care:  No (Coment)  Care giving concerns:  Admitted from a [facility]   Social Worker assessment / plan:  The CSW met with the patient at bedside to discuss discharge planning. The patient confirmed that he is a resident of [facility] and has been for "about 10 years". The patient has a AKA and current acute o2 needs, but he denies chronic o2 use. The patient has had a wound vac at the facility, and he has a sacral wound. The patient's mood was elevated, and he seemed to have a good sense of humor.   The patient will most likely discharge back to [facility] on Monday or Tuesday. The CSW will continue to follow for discharge facilitation.  Employment status:  Retired Forensic scientist:  Medicaid In [place] PT Recommendations:  Not assessed at this time Information / Referral to community resources:     Patient/Family's Response to care: The patient thanked the CSW.  Patient/Family's Understanding of and Emotional Response to Diagnosis, Current Treatment,  and Prognosis:  The patient is in agreement with return to his [facility].  Emotional Assessment Appearance:  Appears stated age Attitude/Demeanor/Rapport:  Engaged, Charismatic, Gracious Affect (typically observed):  Appropriate, Pleasant Orientation:  Oriented to Self, Oriented to Place, Oriented to  Time, Oriented to Situation Alcohol / Substance use:  Never Used Psych involvement (Current and /or in the community):  No (Comment)  Discharge Needs  Concerns to be addressed:  Care Coordination, Discharge Planning Concerns Readmission within the last 30 days:  No Current discharge risk:  Chronically ill, Physical Impairment Barriers to Discharge:  Continued Medical Work up   [employer], LCSW 02/11/2018, 10:06 AM

## 2018-02-11 NOTE — Progress Notes (Signed)
Initial Nutrition Assessment  DOCUMENTATION CODES:   Severe malnutrition in context of chronic illness  INTERVENTION:  Recommend liberalizing diet to carbohydrate modified.  Provide Premier Protein po BID, each supplement provides 160 kcal and 30 grams of protein.  Continue MVI daily, thiamine 546 mg daily, folic acid 1 mg daily.  Also provide Ocuvite QHS to promote wound healing.  NUTRITION DIAGNOSIS:   Severe Malnutrition related to chronic illness(diverticulitis s/p colostomy, chronic pressure ulcers) as evidenced by severe fat depletion, moderate muscle depletion, severe muscle depletion.  GOAL:   Patient will meet greater than or equal to 90% of their needs  MONITOR:   PO intake, Supplement acceptance, Labs, Weight trends, Skin, I & O's  REASON FOR ASSESSMENT:   Consult Assessment of nutrition requirement/status  ASSESSMENT:   62 year old male with PMHx of DM, HTN, diverticulitis s/p colostomy, right AKA, chronic stage II pressure ulcers who is admitted from [facility] with acute septic shock now resolved, UTI, acute symptomatic anemia s/p 1 units pRBC .   Met with patient at bedside. He does not seem to be the best historian. He reports his appetite is "about the same." He lives at ALF and is able to eat 3 meals per day. However, he is unable to report how much he typically eats at his meals and what types of foods he eats. He reports he got his colostomy around 1999. Today at his lunch he ate 100% of his yogurt, but only around 50% of his spaghetti. Patient is amenable to drinking ONS to help meet calorie/protein needs.  Patient reports his UBW was 150 lbs. Very limited weight history in chart so unsure when patient lost weight and he is also not sure. Currently 130.3 lbs.  Meal Completion: 60-90% mainly  Medications reviewed and include: famotidine, ferrous sulfate 270 mg daily, folic acid 1 mg daily, Novolog 0-15 units TID, Novolog 0-5 units QHS,  Levaquin, MVI daily, potassium chloride 40 mEq po 2 times today, thiamine 100 mg daily.  Labs reviewed: CBG 106-193, Potassium 3, Anion gap 4.  NUTRITION - FOCUSED PHYSICAL EXAM:    Most Recent Value  Orbital Region  Severe depletion  Upper Arm Region  Severe depletion  Thoracic and Lumbar Region  Severe depletion  Buccal Region  Severe depletion  Temple Region  Severe depletion  Clavicle Bone Region  Severe depletion  Clavicle and Acromion Bone Region  Severe depletion  Scapular Bone Region  Severe depletion  Dorsal Hand  Severe depletion  Patellar Region  Moderate depletion [s/p right AKA]  Anterior Thigh Region  Moderate depletion [s/p right AKA]  Posterior Calf Region  Severe depletion [s/p right AKA]  Edema (RD Assessment)  None  Hair  Reviewed  Eyes  Reviewed  Mouth  Unable to assess  Skin  Reviewed  Nails  Reviewed     Diet Order:   Diet Order           Diet heart healthy/carb modified Room service appropriate? Yes; Fluid consistency: Thin  Diet effective now          EDUCATION NEEDS:   Not appropriate for education at this time  Skin:  Skin Assessment: Skin Integrity Issues: Skin Integrity Issues:: Stage II Stage II: sacrum x 5  Last BM:  02/11/2018 per chart but no BM characteristics documented  Height:   Ht Readings from Last 1 Encounters:  02/09/18 '5\' 10"'$  (1.778 m)    Weight:   Wt Readings from Last 1 Encounters:  02/09/18 130 lb 4.7 oz (59.1 kg)    Ideal Body Weight:  67.2 kg(adjusted for AKA)  BMI:  Body mass index is 18.69 kg/m.  Estimated Nutritional Needs:   Kcal:  1775-2070 (30-35 kcal/kg)  Protein:  90-100 grams (1.5-1.7 grams/kg)  Fluid:  1.8-2 L/day (30-35 mL/kg)  Willey Blade, MS, RD, LDN Office: [phone] Pager: [phone] After Hours/Weekend Pager: [phone]

## 2018-02-11 NOTE — Progress Notes (Signed)
Patient has a K+ of 3.0. MD notified. MD ordered PO K+ replacement.

## 2018-02-11 NOTE — Progress Notes (Signed)
[employer] [facility] [facility] [place] [facility]   PATIENT NAME: [name]    MR#:  [id number]  DATE OF BIRTH:  06-14-1956  SUBJECTIVE:  CHIEF COMPLAINT:   Chief Complaint  Patient presents with  . Hypotension  had some fever (Tmax 100.6) last evening and tylenolol helped. K 3.0, refusing Nebs (would like to change to prn) REVIEW OF SYSTEMS:  Review of Systems  Constitutional: Positive for fever and malaise/fatigue. Negative for chills and weight loss.  HENT: Negative for nosebleeds and sore throat.   Eyes: Negative for blurred vision.  Respiratory: Negative for cough, shortness of breath and wheezing.   Cardiovascular: Negative for chest pain, orthopnea, leg swelling and PND.  Gastrointestinal: Negative for abdominal pain, constipation, diarrhea, heartburn, nausea and vomiting.  Genitourinary: Negative for dysuria and urgency.  Musculoskeletal: Negative for back pain.  Skin: Negative for rash.  Neurological: Negative for dizziness, speech change, focal weakness and headaches.  Endo/Heme/Allergies: Does not bruise/bleed easily.  Psychiatric/Behavioral: Negative for depression.   DRUG ALLERGIES:   Allergies  Allergen Reactions  . Cefazolin Other (See Comments)    Reaction not listed on MAR  . Cephalosporins   . Penicillins Other (See Comments)    Has patient had a PCN reaction causing immediate rash, facial/tongue/throat swelling, SOB or lightheadedness with hypotension: Unknown Has patient had a PCN reaction causing severe rash involving mucus membranes or skin necrosis: Unknown Has patient had a PCN reaction that required hospitalization: Unknown Has patient had a PCN reaction occurring within the last 10 years: Unknown If all of the above answers are "NO", then may proceed with Cephalosporin use.    VITALS:  Blood pressure (!) 124/96, pulse 78, temperature 98 F (36.7 C), temperature source Oral, resp. rate 16, height 5\' 10"  (1.778 m), weight 59.1 kg (130 lb  4.7 oz), SpO2 98 %. PHYSICAL EXAMINATION:  Physical Exam  Constitutional: He is oriented to person, place, and time.  HENT:  Head: Normocephalic and atraumatic.  Eyes: Pupils are equal, round, and reactive to light. Conjunctivae and EOM are normal.  Neck: Normal range of motion. Neck supple. No tracheal deviation present. No thyromegaly present.  Cardiovascular: Normal rate, regular rhythm and normal heart sounds.  Pulmonary/Chest: Effort normal and breath sounds normal. No respiratory distress. He has no wheezes. He exhibits no tenderness.  Abdominal: Soft. Bowel sounds are normal. He exhibits no distension. There is no tenderness.  Musculoskeletal: Normal range of motion.  Neurological: He is alert and oriented to person, place, and time. No cranial nerve deficit.  Skin: Skin is warm and dry. No rash noted.   LABORATORY PANEL:  Male CBC Recent Labs  Lab 02/11/18 0353  WBC 5.2  HGB 10.2*  HCT 30.0*  PLT 200   ------------------------------------------------------------------------------------------------------------------ Chemistries  Recent Labs  Lab 02/09/18 1126  02/11/18 0353  NA 132*   < > 139  K 4.4   < > 3.0*  CL 104   < > 105  CO2 22   < > 30  GLUCOSE 112*   < > 172*  BUN 16   < > 13  CREATININE 1.00   < > 0.72  CALCIUM 7.6*   < > 8.0*  MG  --    < > 1.7  AST 24  --   --   ALT 9  --   --   ALKPHOS 137*  --   --   BILITOT 0.7  --   --    < > =  values in this interval not displayed.   RADIOLOGY:  No results found. ASSESSMENT AND PLAN:  62 y.o. male with a known history per below which also includes diverticulitis, status post colostomy, chronic ischio/sacral wound stage II, right AKA presenting from assisted living facility with generalized weakness, hypotension, shortness of breath O2 saturation on presentation was 85% admitted for septic shock  *Acute septic shock secondary to UTI: resolved now - required pressors initially (needing ICU for a day),  compounded by severe anemia, guaiac positive -  continue Levaquin, vanco stopped as MRSA eng. follow-up on cultures, IV fluids rehydration, Levophed drip stopped  *Acute community-acquired pneumonia: ruled out - Doubt Bacterial as procalcitonin neg.   *Acute UTI: based on UA Urinalysis grossly abnormal - await urine cultures, antibiotics per above  *Acute symptomatic anemia: 8.1 -> 7.3-> 10.2 (was 11.9 in nov 2018) s/p 1 PRBC transfusion - could be slow GI blood loss, patient guaiac positive - gastroenterology c/s - no luminal eval this admission unless frank bleeding - none thus far, on Pepcid BID  *Chronic diabetes mellitus type 2 Sliding scale insulin Accu-Cheks per routine  *Chronic stage II sacral/ischial decubitus wounds - noninfected Wound care nurse consulted to evaluate/treat Continue local wound care  *Acute hyponatremia: resolved with IVFs - BMP in the morning  * Hypokalemia: K 3.0 - replete and recheck  * Leukopenia: resolved - could be due to infection - monitor    All the records are reviewed and case discussed with Care Management/Social Worker. Management plans discussed with the patient, nursing and they are in agreement.  CODE STATUS: Full Code  TOTAL TIME TAKING CARE OF THIS PATIENT: 35 minutes.   More than 50% of the time was spent in counseling/coordination of care: YES  POSSIBLE D/C IN 1-2 DAYS, DEPENDING ON CLINICAL CONDITION.   Delfino LovettVipul Naeem Quillin M.D on 02/11/2018 at 7:26 AM  Between 7am to 6pm - Pager - [phone]  After 6pm go to [email or web address] - Social research officer, [email or web address] [email or web address]  [facility] [place] [facility]  Office  [phone]  CC: Primary care physician; Armando GangLindley, Cheryl P, FNP  Note: This dictation was prepared with Dragon dictation along with smaller phrase technology. Any transcriptional errors that result from this process are unintentional.

## 2018-02-12 DIAGNOSIS — E43 Unspecified severe protein-calorie malnutrition: Secondary | ICD-10-CM

## 2018-02-12 LAB — BASIC METABOLIC PANEL
ANION GAP: 5 (ref 5–15)
BUN: 11 mg/dL (ref 8–23)
CHLORIDE: 99 mmol/L (ref 98–111)
CO2: 33 mmol/L — ABNORMAL HIGH (ref 22–32)
Calcium: 8.1 mg/dL — ABNORMAL LOW (ref 8.9–10.3)
Creatinine, Ser: 0.61 mg/dL (ref 0.61–1.24)
GFR calc Af Amer: >60 mL/min (ref >60)
GFR calc non Af Amer: >60 mL/min (ref >60)
GLUCOSE: 85 mg/dL (ref 70–99)
POTASSIUM: 3.8 mmol/L (ref 3.5–5.1)
Sodium: 137 mmol/L (ref 135–145)

## 2018-02-12 LAB — URINE CULTURE

## 2018-02-12 LAB — GLUCOSE, CAPILLARY
GLUCOSE-CAPILLARY: 85 mg/dL (ref 70–99)
GLUCOSE-CAPILLARY: 102 mg/dL — AB (ref 70–99)
Glucose-Capillary: 73 mg/dL (ref 70–99)
Glucose-Capillary: 97 mg/dL (ref 70–99)

## 2018-02-12 LAB — CBC
HEMATOCRIT: 32.1 % — AB (ref 40.0–52.0)
HEMOGLOBIN: 10.9 g/dL — AB (ref 13.0–18.0)
MCH: 29.1 pg (ref 26.0–34.0)
MCHC: 33.9 g/dL (ref 32.0–36.0)
MCV: 85.7 fL (ref 80.0–100.0)
Platelets: 239 10*3/uL (ref 150–440)
RBC: 3.75 MIL/uL — ABNORMAL LOW (ref 4.40–5.90)
RDW: 16.5 % — ABNORMAL HIGH (ref 11.5–14.5)
WBC: 6.5 10*3/uL (ref 3.8–10.6)

## 2018-02-12 MED ORDER — NITROFURANTOIN MONOHYD MACRO 100 MG PO CAPS
100.0000 mg | ORAL_CAPSULE | Freq: Two times a day (BID) | ORAL | Status: DC
  Administered 2018-02-12 – 2018-02-13 (×3): 100 mg via ORAL
  Filled 2018-02-12 (×4): qty 1

## 2018-02-12 NOTE — Progress Notes (Addendum)
[employer] - [place] at [facility]   PATIENT NAME: [name]    MR#:  [id number]  DATE OF BIRTH:  [birth year]  SUBJECTIVE:  Doing well this morning. No concerns other than his colostomy bag being full. Denies dysuria, fevers, chills. Not having any SOB or cough.  REVIEW OF SYSTEMS:  Review of Systems  Constitutional: Positive for malaise/fatigue. Negative for chills, fever and weight loss.  HENT: Negative for nosebleeds and sore throat.   Eyes: Negative for blurred vision.  Respiratory: Negative for cough, shortness of breath and wheezing.   Cardiovascular: Negative for chest pain, orthopnea, leg swelling and PND.  Gastrointestinal: Negative for abdominal pain, constipation, diarrhea, heartburn, nausea and vomiting.  Genitourinary: Negative for dysuria and urgency.  Musculoskeletal: Negative for back pain.  Skin: Negative for rash.  Neurological: Negative for dizziness, speech change, focal weakness and headaches.  Endo/Heme/Allergies: Does not bruise/bleed easily.  Psychiatric/Behavioral: Negative for depression.   DRUG ALLERGIES:   Allergies  Allergen Reactions  . Cefazolin Other (See Comments)    Reaction not listed on MAR  . Cephalosporins   . Penicillins Other (See Comments)    Has patient had a PCN reaction causing immediate rash, facial/tongue/throat swelling, SOB or lightheadedness with hypotension: Unknown Has patient had a PCN reaction causing severe rash involving mucus membranes or skin necrosis: Unknown Has patient had a PCN reaction that required hospitalization: Unknown Has patient had a PCN reaction occurring within the last 10 years: Unknown If all of the above answers are "NO", then may proceed with Cephalosporin use.    VITALS:  Blood pressure 131/88, pulse 67, temperature 98.1 F (36.7 C), temperature source Oral, resp. rate 17, height 5\' 10"  (1.778 m), weight 59.1 kg (130 lb 4.7 oz), SpO2 92 %. PHYSICAL EXAMINATION:  Physical  Exam  Constitutional: He is oriented to person, place, and time.  HENT:  Head: Normocephalic and atraumatic.  Eyes: Pupils are equal, round, and reactive to light. Conjunctivae and EOM are normal.  Neck: Normal range of motion. Neck supple. No tracheal deviation present. No thyromegaly present.  Cardiovascular: Normal rate, regular rhythm and normal heart sounds.  Pulmonary/Chest: Effort normal and breath sounds normal. No respiratory distress. He has no wheezes. He exhibits no tenderness.  Abdominal: Soft. Bowel sounds are normal. He exhibits no distension. There is no tenderness.  Musculoskeletal: Normal range of motion.  Neurological: He is alert and oriented to person, place, and time. No cranial nerve deficit.  Skin: Skin is warm and dry. No rash noted.   LABORATORY PANEL:  Male CBC Recent Labs  Lab 02/12/18 0444  WBC 6.5  HGB 10.9*  HCT 32.1*  PLT 239   ------------------------------------------------------------------------------------------------------------------ Chemistries  Recent Labs  Lab 02/09/18 1126  02/11/18 0353  02/12/18 0444  NA 132*   < > 139  --  137  K 4.4   < > 3.0*   < > 3.8  CL 104   < > 105  --  99  CO2 22   < > 30  --  33*  GLUCOSE 112*   < > 172*  --  85  BUN 16   < > 13  --  11  CREATININE 1.00   < > 0.72  --  0.61  CALCIUM 7.6*   < > 8.0*  --  8.1*  MG  --    < > 1.7  --   --   AST 24  --   --   --   --  ALT 9  --   --   --   --   ALKPHOS 137*  --   --   --   --   BILITOT 0.7  --   --   --   --    < > = values in this interval not displayed.   RADIOLOGY:  No results found. ASSESSMENT AND PLAN:  62 y.o. male with a known history per below which also includes diverticulitis, status post colostomy, chronic ischio/sacral wound stage II, right AKA presenting from assisted living facility with generalized weakness, hypotension, shortness of breath O2 saturation on presentation was 85% admitted for septic shock  *Acute septic shock secondary  to UTI: resolved now. Urine culture growing >40,000 colonies of ESBL E. Coli, resistant to fluoroquinolones - required pressors initially (needing ICU for a day) but has been stable off levophed - change levaquin to nitrofurantoin based on urine culture susceptibilities  *Acute community-acquired pneumonia: ruled out and PCT was negative. Now on room air. - monitor - will plan to ambulate tomorrow prior to discharge  *Acute symptomatic anemia: 7.3-> 10.2 (was 11.9 in nov 2018) s/p 1 PRBC transfusion. Stable at 10.9 today. - could be slow GI blood loss, patient guaiac positive - gastroenterology c/s - no eval this admission unless frank bleeding - none thus far, on Pepcid BID. Will need to see them as outpatient.  *Chronic diabetes mellitus type 2- blood sugars well-controlled - continue SSI  *Chronic stage II sacral/ischial decubitus wounds - noninfected - wound care consult - continue local wound care  *Acute hyponatremia: resolved with IVFs - repeat BMP in the morning   All the records are reviewed and case discussed with Care Management/Social Worker. Management plans discussed with the patient, nursing and they are in agreement.  CODE STATUS: Full Code  TOTAL TIME TAKING CARE OF THIS PATIENT: 35 minutes.   More than 50% of the time was spent in counseling/coordination of care: YES  POSSIBLE D/C tomorrow, DEPENDING ON CLINICAL CONDITION, if stable on room air.   Jinny BlossomKaty D Kyan Yurkovich M.D on 02/12/2018 at 1:06 PM  Between 7am to 6pm - Pager [phone]- [phone]  After 6pm go to [email or web address] - Social research officer, governmentpassword EPAS [facility]  [facility] [place] [facility]  Office  [phone]  CC: Primary care physician; Armando GangLindley, Cheryl P, FNP  Note: This dictation was prepared with Dragon dictation along with smaller phrase technology. Any transcriptional errors that result from this process are unintentional.

## 2018-02-12 NOTE — Consult Note (Signed)
WOC Nurse wound consult note Assessment [facility].  No family present. Reason for Consult: Chronic pressure injuries Wound type: Stage 2 PIs to the right ischium and right and left sacral borders Pressure Injury POA: Yes Measurements: Right ischium:  2.8 cm x 2 cm x 0.2 cm.  100% pink granulation tissue, no odor, no drainage. Right sacral border: 2.5 cm x 1.8 cm x 0 cm.  100% pink granulation tissue, no odor, no drainage. Left sacral border:  1 cm x 0.8 cm x 0 cm.  100% pink granulation tissue, no odor, no drainage. Periwound for all sites: Hypopigmented, contracted, healed Dressing procedure/placement/frequency: Daily cleansing with saline, pat dry, apply foam dressings.  I have also added an air mattress.  The patient states he has one at home.  I have encouraged him to eat a nutritious diet with protein to enhance healing. Monitor the wound area(s) for worsening of condition such as: Signs/symptoms of infection,  Increase in size,  Development of or worsening of odor, Development of pain, or increased pain at the affected locations.  Notify the medical team if any of these develop.  Thank you for the consult.  Discussed plan of care with the patient and bedside nurse.  WOC nurse will not follow at this time.  Please re-consult the WOC team if needed.  Helmut MusterSherry Hjalmar Ballengee, RN, MSN, CWOCN, CNS-BC, pager [phone]

## 2018-02-12 NOTE — Care Management (Signed)
Patient admitted with sepsis due to uti.  He transferred out of icu to 1C 02/10/2018. He presents from [facility].  Have left a voicemail message at the facility to discuss whether patient has home health providing service as patient also presented with wound vac on a chronic sacral wound.

## 2018-02-13 LAB — CBC
HEMATOCRIT: 33.1 % — AB (ref 40.0–52.0)
HEMOGLOBIN: 11 g/dL — AB (ref 13.0–18.0)
MCH: 28.8 pg (ref 26.0–34.0)
MCHC: 33.2 g/dL (ref 32.0–36.0)
MCV: 86.6 fL (ref 80.0–100.0)
Platelets: 246 10*3/uL (ref 150–440)
RBC: 3.83 MIL/uL — AB (ref 4.40–5.90)
RDW: 16.7 % — ABNORMAL HIGH (ref 11.5–14.5)
WBC: 6.6 10*3/uL (ref 3.8–10.6)

## 2018-02-13 LAB — BASIC METABOLIC PANEL
ANION GAP: 5 (ref 5–15)
BUN: 11 mg/dL (ref 8–23)
CO2: 34 mmol/L — AB (ref 22–32)
Calcium: 8.3 mg/dL — ABNORMAL LOW (ref 8.9–10.3)
Chloride: 97 mmol/L — ABNORMAL LOW (ref 98–111)
Creatinine, Ser: 0.73 mg/dL (ref 0.61–1.24)
GFR calc non Af Amer: >60 mL/min (ref >60)
Glucose, Bld: 80 mg/dL (ref 70–99)
POTASSIUM: 3.7 mmol/L (ref 3.5–5.1)
SODIUM: 136 mmol/L (ref 135–145)

## 2018-02-13 LAB — GLUCOSE, CAPILLARY
GLUCOSE-CAPILLARY: 84 mg/dL (ref 70–99)
Glucose-Capillary: 105 mg/dL — ABNORMAL HIGH (ref 70–99)

## 2018-02-13 MED ORDER — NITROFURANTOIN MONOHYD MACRO 100 MG PO CAPS: 100.0000 mg | ORAL_CAPSULE | Freq: Two times a day (BID) | ORAL | 0 refills | Status: AC

## 2018-02-13 NOTE — Discharge Instructions (Signed)
It was a pleasure taking care of you while you were in the hospital!   You came into the hospital because you were having weakness and low blood pressures. We think this was due to a urinary tract infection. We treated you with antibiotics and your symptoms got much better.  I have prescribed an antibiotic for you to finish at home. You should take this twice a day for 4 more days (including this evening).  If you start having fevers, low blood pressures, or if you start feeling bad again, please come back to see us!

## 2018-02-13 NOTE — Progress Notes (Signed)
[facility]. EMS called for transportation.

## 2018-02-13 NOTE — Clinical Social Work Note (Signed)
Patient is medically ready for discharge today. CSW notified patient of discharge today. CSW also notified Jimmye NormanLawanda Ray, group home director [phone] of discharge today. Patient will be transported by EMS. RN to call for transport.   Ruthe Mannanandace Lamisha Roussell MSW, [address]CSWA [phone]

## 2018-02-13 NOTE — Care Management Note (Signed)
Case Management Note  Patient Details  Name: Shawn SchlichterRobert Higgins MRN: [id number] Date of Birth: [birth year]  Subjective/Objective:  Patient to be discharged per MD order. Orders in place for home health services. Patient previously set up via [facility] for nursing services. Plans to resume these services at discharge. RNCM and CSW spoke with facility regarding discharge planning. We will arrange transport as fit.  [name] RN BSN RNCM [phone]                     Action/Plan:   Expected Discharge Date:  02/13/18               Expected Discharge Plan:  Home w Home Health Services  In-House Referral:     Discharge planning Services  CM Consult  Post Acute Care Choice:  Home Health, Resumption of Svcs/PTA Provider Choice offered to:  Patient  DME Arranged:    DME Agency:     HH Arranged:  RN HH Agency:  [facility]  Status of Service:  Completed, signed off  If discussed at Long Length of Stay Meetings, dates discussed:    Additional Comments:  Arabelle Bollig A Hillary Schwegler, RN 02/13/2018, 11:12 AM

## 2018-02-13 NOTE — Discharge Summary (Addendum)
[employer] - [place] at [facility]   PATIENT NAME: [name]    MR#:  [id number]  DATE OF BIRTH:  1955-09-09  DATE OF ADMISSION:  02/09/2018   ADMITTING PHYSICIAN: Bertrum Sol, MD  DATE OF DISCHARGE:02/13/18  PRIMARY CARE PHYSICIAN: Armando Gang, FNP   ADMISSION DIAGNOSIS:  Sepsis, due to unspecified organism (HCC) [A41.9] DISCHARGE DIAGNOSIS:  Active Problems:   Septic shock (HCC)   Pressure injury of skin   Iron deficiency anemia due to chronic blood loss   Protein-calorie malnutrition, severe  SECONDARY DIAGNOSIS:   Past Medical History:  Diagnosis Date  . Diabetes mellitus without complication (HCC)   . Hypertension    HOSPITAL COURSE:   [name] is a 62 year old male with a PMH of hypertension, diverticulitis status post colostomy, chronic stage II sacral wound, right AKA who presented to the ED from an assisted living facility with generalized weakness, hypotension, and shortness of breath.  On arrival, patient's blood pressures were in the 50s to 60s systolic and his oxygen saturations were 85% on room air.  Code stroke was initiated and patient was started on levophed due to persistent low blood pressures after fluid resuscitation.  Patient was also noted to have a UTI and symptomatic anemia.  He was admitted to the ICU for further management.  Septic shock secondary to UTI (shock resolved) -Required pressors in the ICU for 24 hours, blood pressures improved and he was transferred to the floor -Initially treated with vancomycin and Levaquin, transitioned to oral Levaquin and then nitrofurantoin after urine cultures grew ESBL E. Coli.  Nitrofurantoin continued for a total of 7 days. -Blood cultures without growth -Initially thought to have community-acquired pneumonia due to his oxygen desaturations, but this was thought to be less likely and procalcitonin was negative.  Able to be weaned to room air without any difficulty.  Acute  symptomatic anemia -Initial hemoglobin was 7.3 (previous hemoglobin was 11.9 in November 2018).  Given 1 unit pRBCs and hemoglobin stabilized around 10. -Patient guaiac positive -Gastroenterology consulted.  Felt that this was likely a slow GI bleed and that patient should be evaluated as an outpatient.  No frank bleeding during this admission.  Chronic type 2 diabetes  -Blood sugar is well controlled this admission, treated with SSI  Chronic stage II sacral decubitus ulcer -No signs of infection this admission -Wound care consulted  DISCHARGE CONDITIONS:  Stable, improved CONSULTS OBTAINED:  Gastroenterology DRUG ALLERGIES:   Allergies  Allergen Reactions  . Cefazolin Other (See Comments)    Reaction not listed on MAR  . Cephalosporins   . Penicillins Other (See Comments)    Has patient had a PCN reaction causing immediate rash, facial/tongue/throat swelling, SOB or lightheadedness with hypotension: Unknown Has patient had a PCN reaction causing severe rash involving mucus membranes or skin necrosis: Unknown Has patient had a PCN reaction that required hospitalization: Unknown Has patient had a PCN reaction occurring within the last 10 years: Unknown If all of the above answers are "NO", then may proceed with Cephalosporin use.    DISCHARGE MEDICATIONS:   Allergies as of 02/13/2018      Reactions   Cefazolin Other (See Comments)   Reaction not listed on MAR   Cephalosporins    Penicillins Other (See Comments)   Has patient had a PCN reaction causing immediate rash, facial/tongue/throat swelling, SOB or lightheadedness with hypotension: Unknown Has patient had a PCN reaction causing severe rash involving mucus membranes or skin  necrosis: Unknown Has patient had a PCN reaction that required hospitalization: Unknown Has patient had a PCN reaction occurring within the last 10 years: Unknown If all of the above answers are "NO", then may proceed with Cephalosporin use.        Medication List    STOP taking these medications   amLODipine 10 MG tablet Commonly known as:  NORVASC   lisinopril 30 MG tablet Commonly known as:  PRINIVIL,ZESTRIL     TAKE these medications   acetaminophen 325 MG tablet Commonly known as:  TYLENOL Take 650 mg by mouth every 4 (four) hours as needed.   ferrous sulfate 325 (65 FE) MG tablet Take 1 tablet by mouth daily.   folic acid 1 MG tablet Commonly known as:  FOLVITE Take 1 mg by mouth daily.   gabapentin 300 MG capsule Commonly known as:  NEURONTIN Take 300 mg by mouth 3 (three) times daily.   indomethacin 50 MG capsule Commonly known as:  INDOCIN Take 50 mg by mouth 2 (two) times daily with a meal.   multivitamin tablet Take 1 tablet by mouth daily.   nitrofurantoin (macrocrystal-monohydrate) 100 MG capsule Commonly known as:  MACROBID Take 1 capsule (100 mg total) by mouth every 12 (twelve) hours for 3 days.   omeprazole 20 MG capsule Commonly known as:  PRILOSEC Take 1 capsule by mouth daily.   simvastatin 10 MG tablet Commonly known as:  ZOCOR Take 1 tablet by mouth daily.   thiamine 100 MG tablet Commonly known as:  VITAMIN B-1 Take 100 mg by mouth daily.   traMADol 50 MG tablet Commonly known as:  ULTRAM Take 50 mg by mouth 2 (two) times daily as needed.        DISCHARGE INSTRUCTIONS:  1. Follow-up with PCP in 1-2 weeks 2.  Norvasc and lisinopril stopped due to low blood pressures.  Please monitor blood pressures as an outpatient and restart as needed. 3. Initial hemoglobin was 7.3, status post 1 unit pRBCs.  Felt to be due to slow GI bleed.  Needs GI follow-up as an outpatient. 4.  Urine culture grew ESBL E. coli.  Patient discharged home on nitrofurantoin for a total 7-day course of antibiotics. DIET:  Regular diet DISCHARGE CONDITION:  Stable ACTIVITY:  Activity as tolerated OXYGEN:  Home Oxygen: No.  Oxygen Delivery: room air DISCHARGE LOCATION:  nursing home   If you  experience worsening of your admission symptoms, develop shortness of breath, life threatening emergency, suicidal or homicidal thoughts you must seek medical attention immediately by calling 911 or calling your MD immediately  if symptoms less severe.  You Must read complete instructions/literature along with all the possible adverse reactions/side effects for all the Medicines you take and that have been prescribed to you. Take any new Medicines after you have completely understood and accpet all the possible adverse reactions/side effects.   Please note  You were cared for by a hospitalist during your hospital stay. If you have any questions about your discharge medications or the care you received while you were in the hospital after you are discharged, you can call the unit and asked to speak with the hospitalist on call if the hospitalist that took care of you is not available. Once you are discharged, your primary care physician will handle any further medical issues. Please note that NO REFILLS for any discharge medications will be authorized once you are discharged, as it is imperative that you return to your primary care physician (  or establish a relationship with a primary care physician if you do not have one) for your aftercare needs so that they can reassess your need for medications and monitor your lab values.    On the day of Discharge:  Seen and examined. No acute complaints. Patient states he is ready for discharge. VITAL SIGNS:  Blood pressure 117/88, pulse 74, temperature 98 F (36.7 C), temperature source Oral, resp. rate 18, height 5\' 10"  (1.778 m), weight 59.1 kg (130 lb 4.7 oz), SpO2 90 %. PHYSICAL EXAMINATION:  Constitutional: He is oriented to person, place, and time.  HENT:  Head: Normocephalic and atraumatic.  Eyes: Pupils are equal, round, and reactive to light. Conjunctivae and EOM are normal.  Neck: Normal range of motion. Neck supple. No tracheal deviation  present. No thyromegaly present.  Cardiovascular: Normal rate, regular rhythm and normal heart sounds.  Pulmonary/Chest: Effort normal and breath sounds normal. No respiratory distress. He has no wheezes. He exhibits no tenderness.  Abdominal: Soft. Bowel sounds are normal. He exhibits no distension. There is no tenderness.  Musculoskeletal: Normal range of motion. R AKA. Neurological: He is alert and oriented to person, place, and time. No cranial nerve deficit.  Skin: Skin is warm and dry. No rash noted.  DATA REVIEW:   CBC Recent Labs  Lab 02/13/18 0642  WBC 6.6  HGB 11.0*  HCT 33.1*  PLT 246    Chemistries  Recent Labs  Lab 02/09/18 1126  02/11/18 0353  02/13/18 0642  NA 132*   < > 139   < > 136  K 4.4   < > 3.0*   < > 3.7  CL 104   < > 105   < > 97*  CO2 22   < > 30   < > 34*  GLUCOSE 112*   < > 172*   < > 80  BUN 16   < > 13   < > 11  CREATININE 1.00   < > 0.72   < > 0.73  CALCIUM 7.6*   < > 8.0*   < > 8.3*  MG  --    < > 1.7  --   --   AST 24  --   --   --   --   ALT 9  --   --   --   --   ALKPHOS 137*  --   --   --   --   BILITOT 0.7  --   --   --   --    < > = values in this interval not displayed.     Microbiology Results  Results for orders placed or performed during the hospital encounter of 02/09/18  Blood Culture (routine x 2)     Status: None (Preliminary result)   Collection Time: 02/09/18 11:26 AM  Result Value Ref Range Status   Specimen Description BLOOD BLOOD RIGHT FOREARM  Final   Special Requests   Final    BOTTLES DRAWN AEROBIC AND ANAEROBIC Blood Culture adequate volume   Culture   Final    NO GROWTH 4 DAYS Performed at [facility], [address] [place], [place] [address]    Report Status PENDING  Incomplete  Blood Culture (routine x 2)     Status: None (Preliminary result)   Collection Time: 02/09/18 11:26 AM  Result Value Ref Range Status   Specimen Description BLOOD RIGHT ARM  Final   Special Requests   Final  BOTTLES DRAWN AEROBIC AND ANAEROBIC Blood Culture adequate volume   Culture   Final    NO GROWTH 4 DAYS Performed at [facility], [address] [place], [place] [address]    Report Status PENDING  Incomplete  MRSA PCR Screening     Status: None   Collection Time: 02/09/18  4:25 PM  Result Value Ref Range Status   MRSA by PCR NEGATIVE NEGATIVE Final    Comment:        The GeneXpert MRSA Assay (FDA approved for NASAL specimens only), is one component of a comprehensive MRSA colonization surveillance program. It is not intended to diagnose MRSA infection nor to guide or monitor treatment for MRSA infections. Performed at [facility], [address] [place], [place] [address]   Urine Culture     Status: Abnormal   Collection Time: 02/09/18  4:27 PM  Result Value Ref Range Status   Specimen Description   Final    URINE, CLEAN CATCH Performed at [facility], [address] [place], [place] [address]    Special Requests   Final    NONE Performed at [facility], [address] [place], [place] [address]    Culture (A)  Final    40,000 COLONIES/mL ESCHERICHIA COLI Confirmed Extended Spectrum Beta-Lactamase Producer (ESBL).  In bloodstream infections from ESBL organisms, carbapenems are preferred over piperacillin/tazobactam. They are shown to have a lower risk of mortality.    Report Status 02/12/2018 FINAL  Final   Organism ID, Bacteria ESCHERICHIA COLI (A)  Final      Susceptibility   Escherichia coli - MIC*    AMPICILLIN >=32 RESISTANT Resistant     CEFAZOLIN >=64 RESISTANT Resistant     CEFTRIAXONE RESISTANT Resistant     CIPROFLOXACIN >=4 RESISTANT Resistant     GENTAMICIN <=1 SENSITIVE Sensitive     IMIPENEM <=0.25 SENSITIVE Sensitive     NITROFURANTOIN <=16 SENSITIVE Sensitive     TRIMETH/SULFA >=320 RESISTANT Resistant     AMPICILLIN/SULBACTAM 16 INTERMEDIATE Intermediate     PIP/TAZO 16 SENSITIVE Sensitive      Extended ESBL POSITIVE Resistant     * 40,000 COLONIES/mL ESCHERICHIA COLI  Culture, blood (routine x 2) Call MD if unable to obtain prior to antibiotics being given     Status: None (Preliminary result)   Collection Time: 02/09/18  4:55 PM  Result Value Ref Range Status   Specimen Description BLOOD BLOOD RIGHT WRIST  Final   Special Requests   Final    BOTTLES DRAWN AEROBIC AND ANAEROBIC Blood Culture adequate volume   Culture   Final    NO GROWTH 4 DAYS Performed at [facility], [address], [place], [place] [address]    Report Status PENDING  Incomplete  Culture, sputum-assessment     Status: None   Collection Time: 02/10/18  3:06 AM  Result Value Ref Range Status   Specimen Description EXPECTORATED SPUTUM  Final   Special Requests NONE  Final   Sputum evaluation   Final    Sputum specimen not acceptable for testing.  Please recollect.   C/RENEE BABB AT 0525 02/10/18.PMH Performed at [facility], [address], [place], [place] [address]    Report Status 02/10/2018 FINAL  Final    RADIOLOGY:  No results found.   Management plans discussed with the patient, family and they are in agreement.  CODE STATUS: Full Code   TOTAL TIME TAKING CARE OF THIS PATIENT: 35 minutes.  Jinny BlossomKaty D Mayo M.D on 02/13/2018 at 8:28 AM  Between 7am to 6pm - Pager - [phone]  After 6pm go to [email or web address] - Social research officer, governmentpassword EPAS [facility]  [facility] [place] [facility]  Office  [phone]  CC: Primary care physician; Armando GangLindley, Cheryl P, FNP   Note: This dictation was prepared with Dragon dictation along with smaller phrase technology. Any transcriptional errors that result from this process are unintentional.

## 2018-02-14 LAB — TYPE AND SCREEN
ABO/RH(D): B NEG
Antibody Screen: POSITIVE
DONOR AG TYPE: NEGATIVE
DONOR AG TYPE: NEGATIVE
DONOR AG TYPE: NEGATIVE
PT AG TYPE: POSITIVE
Unit division: 0
Unit division: 0
Unit division: 0

## 2018-02-14 LAB — CULTURE, BLOOD (ROUTINE X 2)
Culture: NO GROWTH
Culture: NO GROWTH
Culture: NO GROWTH
SPECIAL REQUESTS: ADEQUATE
SPECIAL REQUESTS: ADEQUATE
Special Requests: ADEQUATE

## 2018-02-14 LAB — BPAM RBC
BLOOD PRODUCT EXPIRATION DATE: 201909052359
Blood Product Expiration Date: 201908102359
Blood Product Expiration Date: 201908282359
ISSUE DATE / TIME: 201908031758
UNIT TYPE AND RH: 1700
Unit Type and Rh: 1700
Unit Type and Rh: 9500

## 2018-02-18 NOTE — Progress Notes (Signed)
ALY, HAUSER ([id number]) Visit Report for 02/06/2018 Arrival Information Details Patient Name: [name], [name] Date of Service: 02/06/2018 10:15 AM Medical Record Number: [id number] Patient Account Number: [id number] Date of Birth/Sex: Jan 18, 1956 (61 y.o. M) Treating RN: Rema Jasmine Primary Care Aylen Stradford: Franco Nones Other Clinician: Referring Lerin Jech: Franco Nones Treating Carmela Piechowski/Extender: Linwood Dibbles, HOYT Weeks in Treatment: 5 Visit Information History Since Last Visit Added or deleted any medications: No Patient Arrived: Wheel Chair Any new allergies or adverse reactions: No Arrival Time: 10:01 Had a fall or experienced change in No Accompanied By: caregiver activities of daily living that may affect Transfer Assistance: None risk of falls: Patient Identification Verified: Yes Signs or symptoms of abuse/neglect since last visito No Secondary Verification Process Completed: Yes Hospitalized since last visit: No Implantable device outside of the clinic excluding No cellular tissue based products placed in the center since last visit: Has Dressing in Place as Prescribed: Yes Pain Present Now: No Electronic Signature(s) Signed: 02/09/2018 4:42:53 PM By: Rema Jasmine Entered By: Rema Jasmine on 02/06/2018 10:02:31 [name] ([id number]) -------------------------------------------------------------------------------- Clinic Level of Care Assessment Details Patient Name: [name] Date of Service: 02/06/2018 10:15 AM Medical Record Number: [id number] Patient Account Number: [id number] Date of Birth/Sex: July 11, 1956 (61 y.o. M) Treating RN: Huel Coventry Primary Care Illa Enlow: Franco Nones Other Clinician: Referring Syanne Looney: Franco Nones Treating Burrel Legrand/Extender: Linwood Dibbles, HOYT Weeks in Treatment: 5 Clinic Level of Care Assessment Items TOOL 4 Quantity Score []  - Use when only an EandM is performed on FOLLOW-UP visit 0 ASSESSMENTS - Nursing Assessment /  Reassessment []  - Reassessment of Co-morbidities (includes updates in patient status) 0 X- 1 5 Reassessment of Adherence to Treatment Plan ASSESSMENTS - Wound and Skin Assessment / Reassessment []  - Simple Wound Assessment / Reassessment - one wound 0 X- 4 5 Complex Wound Assessment / Reassessment - multiple wounds []  - 0 Dermatologic / Skin Assessment (not related to wound area) ASSESSMENTS - Focused Assessment []  - Circumferential Edema Measurements - multi extremities 0 []  - 0 Nutritional Assessment / Counseling / Intervention []  - 0 Lower Extremity Assessment (monofilament, tuning fork, pulses) []  - 0 Peripheral Arterial Disease Assessment (using hand held doppler) ASSESSMENTS - Ostomy and/or Continence Assessment and Care []  - Incontinence Assessment and Management 0 []  - 0 Ostomy Care Assessment and Management (repouching, etc.) PROCESS - Coordination of Care X - Simple Patient / Family Education for ongoing care 1 15 []  - 0 Complex (extensive) Patient / Family Education for ongoing care X- 1 10 Staff obtains Chiropractor, Records, Test Results / Process Orders []  - 0 Staff telephones HHA, Nursing Homes / Clarify orders / etc []  - 0 Routine Transfer to another Facility (non-emergent condition) []  - 0 Routine Hospital Admission (non-emergent condition) []  - 0 New Admissions / Manufacturing engineer / Ordering NPWT, Apligraf, etc. []  - 0 Emergency Hospital Admission (emergent condition) X- 1 10 Simple Discharge Coordination Odonnell, Quintrell ([id number]) []  - 0 Complex (extensive) Discharge Coordination PROCESS - Special Needs []  - Pediatric / Minor Patient Management 0 []  - 0 Isolation Patient Management []  - 0 Hearing / Language / Visual special needs []  - 0 Assessment of Community assistance (transportation, D/C planning, etc.) []  - 0 Additional assistance / Altered mentation []  - 0 Support Surface(s) Assessment (bed, cushion, seat, etc.) INTERVENTIONS - Wound  Cleansing / Measurement []  - Simple Wound Cleansing - one wound 0 X- 4 5 Complex Wound Cleansing - multiple wounds []  - 0 Wound Imaging (photographs - any number of wounds) []  -  0 Wound Tracing (instead of photographs) []  - 0 Simple Wound Measurement - one wound []  - 0 Complex Wound Measurement - multiple wounds INTERVENTIONS - Wound Dressings []  - Small Wound Dressing one or multiple wounds 0 X- 4 15 Medium Wound Dressing one or multiple wounds []  - 0 Large Wound Dressing one or multiple wounds []  - 0 Application of Medications - topical []  - 0 Application of Medications - injection INTERVENTIONS - Miscellaneous []  - External ear exam 0 []  - 0 Specimen Collection (cultures, biopsies, blood, body fluids, etc.) []  - 0 Specimen(s) / Culture(s) sent or taken to Lab for analysis []  - 0 Patient Transfer (multiple staff / Nurse, adult / Similar devices) []  - 0 Simple Staple / Suture removal (25 or less) []  - 0 Complex Staple / Suture removal (26 or more) []  - 0 Hypo / Hyperglycemic Management (close monitor of Blood Glucose) []  - 0 Ankle / Brachial Index (ABI) - do not check if billed separately []  - 0 Vital Signs Marquard, Cabe ([id number]) Has the patient been seen at the hospital within the last three years: Yes Total Score: 140 Level Of Care: New/Established - Level 4 Electronic Signature(s) Signed: 02/09/2018 6:17:48 PM By: Elliot Gurney, BSN, RN, CWS, Kim RN, BSN Entered By: Elliot Gurney, BSN, RN, CWS, Kim on 02/06/2018 10:50:43 [name] ([id number]) -------------------------------------------------------------------------------- Encounter Discharge Information Details Patient Name: [name] Date of Service: 02/06/2018 10:15 AM Medical Record Number: [id number] Patient Account Number: [id number] Date of Birth/Sex: 01-30-1956 (61 y.o. M) Treating RN: Renne Crigler Primary Care Nhyira Leano: Franco Nones Other Clinician: Referring Evony Rezek: Franco Nones Treating  Tyra Michelle/Extender: Linwood Dibbles, HOYT Weeks in Treatment: 5 Encounter Discharge Information Items Discharge Condition: Stable Ambulatory Status: Wheelchair Discharge Destination: Home Transportation: Private Auto Schedule Follow-up Appointment: Yes Clinical Summary of Care: Electronic Signature(s) Signed: 02/07/2018 5:09:20 PM By: Renne Crigler Entered By: Renne Crigler on 02/06/2018 10:48:48 [name] ([id number]) -------------------------------------------------------------------------------- Lower Extremity Assessment Details Patient Name: [name] Date of Service: 02/06/2018 10:15 AM Medical Record Number: [id number] Patient Account Number: [id number] Date of Birth/Sex: 1956/01/27 (61 y.o. M) Treating RN: Rema Jasmine Primary Care Landers Prajapati: Franco Nones Other Clinician: Referring Anahi Belmar: Franco Nones Treating Jaliya Siegmann/Extender: Linwood Dibbles, HOYT Weeks in Treatment: 5 Electronic Signature(s) Signed: 02/09/2018 4:42:53 PM By: Rema Jasmine Entered By: Rema Jasmine on 02/06/2018 10:04:41 [name] ([id number]) -------------------------------------------------------------------------------- Multi Wound Chart Details Patient Name: [name] Date of Service: 02/06/2018 10:15 AM Medical Record Number: [id number] Patient Account Number: [id number] Date of Birth/Sex: December 12, 1955 (61 y.o. M) Treating RN: Huel Coventry Primary Care Valyncia Wiens: Franco Nones Other Clinician: Referring Shanik Brookshire: Franco Nones Treating Avyn Coate/Extender: STONE III, HOYT Weeks in Treatment: 5 Vital Signs Height(in): 70 Pulse(bpm): 83 Weight(lbs): 155 Blood Pressure(mmHg): 102/69 Body Mass Index(BMI): 22 Temperature(F): 98.2 Respiratory Rate 18 (breaths/min): Photos: [1:No Photos] [2:No Photos] [3:No Photos] Wound Location: [1:Right Gluteus] [2:Left Gluteus] [3:Right Ischium] Wounding Event: [1:Pressure Injury] [2:Pressure Injury] [3:Pressure Injury] Primary Etiology: [1:Pressure  Ulcer] [2:Pressure Ulcer] [3:Pressure Ulcer] Comorbid History: [1:Chronic sinus problems/congestion, Anemia, Arrhythmia, Hepatitis C, Type II Diabetes] [2:Chronic sinus problems/congestion, Anemia, Arrhythmia, Hepatitis C, Type II Diabetes] [3:Chronic sinus problems/congestion, Anemia, Arrhythmia,  Hepatitis C, Type II Diabetes] Date Acquired: [1:12/11/2017] [2:12/15/2017] [3:12/15/2017] Weeks of Treatment: [1:5] [2:5] [3:5] Wound Status: [1:Open] [2:Open] [3:Open] Clustered Wound: [1:No] [2:No] [3:No] Measurements L x W x D [1:2.5x1x0.1] [2:1.9x0.9x0.1] [3:2.6x4x0.1] (cm) Area (cm) : [1:1.963] [2:1.343] [3:8.168] Volume (cm) : [1:0.196] [2:0.134] [3:0.817] % Reduction in Area: [1:40.50%] [2:71.50%] [3:-8.30%] % Reduction in Volume: [1:40.60%] [2:71.50%] [3:-8.40%] Classification: [1:Category/Stage III] [2:Category/Stage  III] [3:Category/Stage III] Exudate Amount: [1:Small] [2:N/A] [3:Small] Exudate Type: [1:Sanguinous] [2:N/A] [3:Sanguinous] Exudate Color: [1:red] [2:N/A] [3:red] Granulation Amount: [1:Small (1-33%)] [2:Large (67-100%)] [3:Large (67-100%)] Granulation Quality: [1:Red, Pink] [2:Red] [3:Red] Necrotic Amount: [1:None Present (0%)] [2:None Present (0%)] [3:N/A] Periwound Skin Texture: [1:No Abnormalities Noted] [2:No Abnormalities Noted] [3:No Abnormalities Noted] Periwound Skin Moisture: [1:No Abnormalities Noted] [2:No Abnormalities Noted] [3:No Abnormalities Noted] Periwound Skin Color: [1:Erythema: Yes] [2:Erythema: Yes] [3:No Abnormalities Noted] Erythema Location: [1:Circumferential] [2:Circumferential] [3:N/A] Tenderness on Palpation: [1:No] [2:No] [3:No] Wound Preparation: [1:Ulcer Cleansing: Rinsed/Irrigated with Saline] [2:Ulcer Cleansing: Rinsed/Irrigated with Saline] [3:Ulcer Cleansing: Rinsed/Irrigated with Saline] Topical Anesthetic Applied: Topical Anesthetic Applied: Topical Anesthetic Applied: Other: lidocaine 4% Other: lidocaine 4% Other: lidocaine  4% Wound Number: 4 5 N/A Weltz, Harveer ([id number]) Photos: No Photos No Photos N/A Wound Location: Left Ischium Right Gluteal fold N/A Wounding Event: Pressure Injury Pressure Injury N/A Primary Etiology: Pressure Ulcer Pressure Ulcer N/A Comorbid History: Chronic sinus Chronic sinus N/A problems/congestion, Anemia, problems/congestion, Anemia, Arrhythmia, Hepatitis C, Type Arrhythmia, Hepatitis C, Type II Diabetes II Diabetes Date Acquired: 12/15/2017 12/15/2017 N/A Weeks of Treatment: 5 5 N/A Wound Status: Healed - Epithelialized Open N/A Clustered Wound: Yes No N/A Measurements L x W x D 0x0x0 1x1.3x0.1 N/A (cm) Area (cm) : 0 1.021 N/A Volume (cm) : 0 0.102 N/A % Reduction in Area: 100.00% 91.90% N/A % Reduction in Volume: 100.00% 91.90% N/A Classification: Category/Stage III Category/Stage III N/A Exudate Amount: None Present N/A N/A Exudate Type: N/A N/A N/A Exudate Color: N/A N/A N/A Granulation Amount: None Present (0%) Medium (34-66%) N/A Granulation Quality: N/A Red N/A Necrotic Amount: None Present (0%) N/A N/A Exposed Structures: Fascia: No Fascia: No N/A Fat Layer (Subcutaneous Fat Layer (Subcutaneous Tissue) Exposed: No Tissue) Exposed: No Tendon: No Tendon: No Muscle: No Muscle: No Joint: No Joint: No Bone: No Bone: No Periwound Skin Texture: No Abnormalities Noted No Abnormalities Noted N/A Periwound Skin Moisture: No Abnormalities Noted No Abnormalities Noted N/A Periwound Skin Color: No Abnormalities Noted Erythema: Yes N/A Erythema Location: N/A Circumferential N/A Tenderness on Palpation: No No N/A Wound Preparation: Ulcer Cleansing: Ulcer Cleansing: N/A Rinsed/Irrigated with Saline Rinsed/Irrigated with Saline Topical Anesthetic Applied: Other: lidocaine 4% Treatment Notes Electronic Signature(s) Signed: 02/06/2018 10:42:29 AM By: Elliot GurneyWoody, BSN, RN, CWS, Kim RN, BSN Entered By: Elliot GurneyWoody, BSN, RN, CWS, Kim on 02/06/2018 10:42:20 Shawn SchlichterANDLES, Brewer  ([id number]) -------------------------------------------------------------------------------- Multi-Disciplinary Care Plan Details Patient Name: Shawn SchlichterANDLES, Filippo Date of Service: 02/06/2018 10:15 AM Medical Record Number: [id number] Patient Account Number: [id number] Date of Birth/Sex: 23-Apr-1956 (61 y.o. M) Treating RN: Huel CoventryWoody, Kim Primary Care Whitnee Orzel: Franco NonesLINDLEY, CHERYL Other Clinician: Referring Briante Loveall: Franco NonesLINDLEY, CHERYL Treating Brandon Wiechman/Extender: STONE III, HOYT Weeks in Treatment: 5 Active Inactive ` Abuse / Safety / Falls / Self Care Management Nursing Diagnoses: Impaired physical mobility Goals: Patient will not develop complications from immobility Date Initiated: 01/02/2018 Target Resolution Date: 02/16/2018 Goal Status: Active Interventions: Assess fall risk on admission and as needed Notes: ` Nutrition Nursing Diagnoses: Potential for alteratiion in Nutrition/Potential for imbalanced nutrition Goals: Patient/caregiver agrees to and verbalizes understanding of need to use nutritional supplements and/or vitamins as prescribed Date Initiated: 01/02/2018 Target Resolution Date: 03/17/2018 Goal Status: Active Interventions: Provide education on nutrition Notes: ` Orientation to the Wound Care Program Nursing Diagnoses: Knowledge deficit related to the wound healing center program Goals: Patient/caregiver will verbalize understanding of the Wound Healing Center Program Date Initiated: 01/02/2018 Target Resolution Date: 02/16/2018 Goal Status: Active Interventions: Shawn SchlichterRANDLES, Alven ([id number]) Provide education on orientation to the wound  center Notes: ` Pressure Nursing Diagnoses: Potential for impaired tissue integrity related to pressure, friction, moisture, and shear Goals: Patient will remain free of pressure ulcers Date Initiated: 01/02/2018 Target Resolution Date: 03/17/2018 Goal Status: Active Interventions: Assess potential for pressure ulcer upon admission and  as needed Notes: ` Wound/Skin Impairment Nursing Diagnoses: Impaired tissue integrity Goals: Ulcer/skin breakdown will heal within 14 weeks Date Initiated: 01/02/2018 Target Resolution Date: 03/16/2018 Goal Status: Active Interventions: Assess patient/caregiver ability to obtain necessary supplies Assess patient/caregiver ability to perform ulcer/skin care regimen upon admission and as needed Assess ulceration(s) every visit Notes: Electronic Signature(s) Signed: 02/06/2018 10:41:52 AM By: Elliot Gurney, BSN, RN, CWS, Kim RN, BSN Entered By: Elliot Gurney, BSN, RN, CWS, Kim on 02/06/2018 10:41:46 [name] ([id number]) -------------------------------------------------------------------------------- Pain Assessment Details Patient Name: [name] Date of Service: 02/06/2018 10:15 AM Medical Record Number: [id number] Patient Account Number: [id number] Date of Birth/Sex: 1956-04-25 (61 y.o. M) Treating RN: Rema Jasmine Primary Care Kalyssa Anker: Franco Nones Other Clinician: Referring Melisia Leming: Franco Nones Treating Kristilyn Coltrane/Extender: STONE III, HOYT Weeks in Treatment: 5 Active Problems Location of Pain Severity and Description of Pain Patient Has Paino No Site Locations Pain Management and Medication Current Pain Management: Goals for Pain Management encouraged pt to see medical Leighla Chestnutt prn for pain Electronic Signature(s) Signed: 02/09/2018 4:42:53 PM By: Rema Jasmine Entered By: Rema Jasmine on 02/06/2018 10:03:39 [name] ([id number]) -------------------------------------------------------------------------------- Patient/Caregiver Education Details Patient Name: [name] Date of Service: 02/06/2018 10:15 AM Medical Record Number: [id number] Patient Account Number: [id number] Date of Birth/Gender: [birth year] (61 y.o. M) Treating RN: Renne Crigler Primary Care Physician: Franco Nones Other Clinician: Referring Physician: Franco Nones Treating Physician/Extender:  Skeet Simmer in Treatment: 5 Education Assessment Education Provided To: Patient Education Topics Provided Wound/Skin Impairment: Handouts: Caring for Your Ulcer Methods: Explain/Verbal Responses: State content correctly Electronic Signature(s) Signed: 02/07/2018 5:09:20 PM By: Renne Crigler Entered By: Renne Crigler on 02/06/2018 10:49:15 [name] ([id number]) -------------------------------------------------------------------------------- Wound Assessment Details Patient Name: [name] Date of Service: 02/06/2018 10:15 AM Medical Record Number: [id number] Patient Account Number: [id number] Date of Birth/Sex: 1955-08-13 (61 y.o. M) Treating RN: Rema Jasmine Primary Care Meshilem Machuca: Franco Nones Other Clinician: Referring Deangleo Passage: Franco Nones Treating Davey Bergsma/Extender: STONE III, HOYT Weeks in Treatment: 5 Wound Status Wound Number: 1 Primary Pressure Ulcer Etiology: Wound Location: Right Gluteus Wound Open Wounding Event: Pressure Injury Status: Date Acquired: 12/11/2017 Comorbid Chronic sinus problems/congestion, Anemia, Weeks Of Treatment: 5 History: Arrhythmia, Hepatitis C, Type II Diabetes Clustered Wound: No Photos Photo Uploaded By: Rema Jasmine on 02/06/2018 10:47:39 Wound Measurements Length: (cm) 2.5 Width: (cm) 1 Depth: (cm) 0.1 Area: (cm) 1.963 Volume: (cm) 0.196 % Reduction in Area: 40.5% % Reduction in Volume: 40.6% Tunneling: No Undermining: No Wound Description Classification: Category/Stage III Foul O Exudate Amount: Small Exudate Type: Sanguinous Exudate Color: red dor After Cleansing: No Wound Bed Granulation Amount: Small (1-33%) Granulation Quality: Red, Pink Necrotic Amount: None Present (0%) Periwound Skin Texture Texture Color No Abnormalities Noted: No No Abnormalities Noted: No Erythema: Yes Moisture Erythema Location: Circumferential No Abnormalities Noted: No Wound Preparation Ulcer Cleansing:  Rinsed/Irrigated with Saline Topical Anesthetic Applied: Other: lidocaine 4%, Mckeithan, Kemani ([id number]) Treatment Notes Wound #1 (Right Gluteus) 1. Cleansed with: Clean wound with Normal Saline 2. Anesthetic Topical Lidocaine 4% cream to wound bed prior to debridement 4. Dressing Applied: Hydrafera Blue 5. Secondary Dressing Applied Bordered Foam Dressing Electronic Signature(s) Signed: 02/09/2018 4:42:53 PM By: Rema Jasmine Entered By: Rema Jasmine on 02/06/2018 10:16:33 Ganoe, Molly Maduro ([id number]) -------------------------------------------------------------------------------- Wound  Assessment Details Patient Name: LYNDOL, VANDERHEIDEN Date of Service: 02/06/2018 10:15 AM Medical Record Number: [id number] Patient Account Number: [id number] Date of Birth/Sex: 11/18/1955 (61 y.o. M) Treating RN: Rema Jasmine Primary Care Jerie Basford: Franco Nones Other Clinician: Referring Anila Bojarski: Franco Nones Treating Rozelle Caudle/Extender: STONE III, HOYT Weeks in Treatment: 5 Wound Status Wound Number: 2 Primary Pressure Ulcer Etiology: Wound Location: Left Gluteus Wound Open Wounding Event: Pressure Injury Status: Date Acquired: 12/15/2017 Comorbid Chronic sinus problems/congestion, Anemia, Weeks Of Treatment: 5 History: Arrhythmia, Hepatitis C, Type II Diabetes Clustered Wound: No Photos Photo Uploaded By: Rema Jasmine on 02/06/2018 10:48:25 Wound Measurements Length: (cm) 1.9 Width: (cm) 0.9 Depth: (cm) 0.1 Area: (cm) 1.343 Volume: (cm) 0.134 % Reduction in Area: 71.5% % Reduction in Volume: 71.5% Tunneling: No Undermining: No Wound Description Classification: Category/Stage III Foul Odor After Cleansing: No Slough/Fibrino No Wound Bed Granulation Amount: Large (67-100%) Granulation Quality: Red Necrotic Amount: None Present (0%) Periwound Skin Texture Texture Color No Abnormalities Noted: No No Abnormalities Noted: No Erythema: Yes Moisture Erythema Location:  Circumferential No Abnormalities Noted: No Wound Preparation Ulcer Cleansing: Rinsed/Irrigated with Saline Topical Anesthetic Applied: Other: lidocaine 4%, Akard, Jatavious ([id number]) Treatment Notes Wound #2 (Left Gluteus) 1. Cleansed with: Clean wound with Normal Saline 2. Anesthetic Topical Lidocaine 4% cream to wound bed prior to debridement 4. Dressing Applied: Hydrafera Blue 5. Secondary Dressing Applied Bordered Foam Dressing Electronic Signature(s) Signed: 02/09/2018 4:42:53 PM By: Rema Jasmine Entered By: Rema Jasmine on 02/06/2018 10:19:58 [name] ([id number]) -------------------------------------------------------------------------------- Wound Assessment Details Patient Name: [name] Date of Service: 02/06/2018 10:15 AM Medical Record Number: [id number] Patient Account Number: [id number] Date of Birth/Sex: 08/26/1955 (61 y.o. M) Treating RN: Rema Jasmine Primary Care Jolinda Pinkstaff: Franco Nones Other Clinician: Referring Lyris Hitchman: Franco Nones Treating Gavynn Duvall/Extender: STONE III, HOYT Weeks in Treatment: 5 Wound Status Wound Number: 3 Primary Pressure Ulcer Etiology: Wound Location: Right Ischium Wound Open Wounding Event: Pressure Injury Status: Date Acquired: 12/15/2017 Comorbid Chronic sinus problems/congestion, Anemia, Weeks Of Treatment: 5 History: Arrhythmia, Hepatitis C, Type II Diabetes Clustered Wound: No Photos Photo Uploaded By: Rema Jasmine on 02/06/2018 10:49:26 Wound Measurements Length: (cm) 2.6 Width: (cm) 4 Depth: (cm) 0.1 Area: (cm) 8.168 Volume: (cm) 0.817 % Reduction in Area: -8.3% % Reduction in Volume: -8.4% Tunneling: No Undermining: No Wound Description Classification: Category/Stage III Foul Od Exudate Amount: Small Slough/ Exudate Type: Sanguinous Exudate Color: red or After Cleansing: No Fibrino No Wound Bed Granulation Amount: Large (67-100%) Exposed Structure Granulation Quality: Red Fascia Exposed: No Fat  Layer (Subcutaneous Tissue) Exposed: No Tendon Exposed: No Muscle Exposed: No Joint Exposed: No Bone Exposed: No Periwound Skin Texture Texture Color No Abnormalities Noted: No No Abnormalities Noted: No Moisture No Abnormalities Noted: No Lomas, Isahia ([id number]) Wound Preparation Ulcer Cleansing: Rinsed/Irrigated with Saline Topical Anesthetic Applied: Other: lidocaine 4%, Treatment Notes Wound #3 (Right Ischium) 1. Cleansed with: Clean wound with Normal Saline 2. Anesthetic Topical Lidocaine 4% cream to wound bed prior to debridement 4. Dressing Applied: Hydrafera Blue 5. Secondary Dressing Applied Bordered Foam Dressing Electronic Signature(s) Signed: 02/09/2018 4:42:53 PM By: Rema Jasmine Entered By: Rema Jasmine on 02/06/2018 10:22:40 [name] ([id number]) -------------------------------------------------------------------------------- Wound Assessment Details Patient Name: [name] Date of Service: 02/06/2018 10:15 AM Medical Record Number: [id number] Patient Account Number: [id number] Date of Birth/Sex: [birth year] (61 y.o. M) Treating RN: Huel Coventry Primary Care Makenah Karas: Franco Nones Other Clinician: Referring Dyquan Minks: Franco Nones Treating Yona Stansbury/Extender: STONE III, HOYT Weeks in Treatment: 5 Wound Status Wound Number: 4 Primary Pressure Ulcer Etiology:  Wound Location: Left Ischium Wound Healed - Epithelialized Wounding Event: Pressure Injury Status: Date Acquired: 12/15/2017 Comorbid Chronic sinus problems/congestion, Anemia, Weeks Of Treatment: 5 History: Arrhythmia, Hepatitis C, Type II Diabetes Clustered Wound: Yes Photos Photo Uploaded By: Rema Jasmine on 02/06/2018 10:51:16 Wound Measurements Length: (cm) 0 % Redu Width: (cm) 0 % Redu Depth: (cm) 0 Underm Area: (cm) 0 Volume: (cm) 0 ction in Area: 100% ction in Volume: 100% ining: No Wound Description Classification: Category/Stage III Exudate Amount: None Present Foul  Odor After Cleansing: No Slough/Fibrino No Wound Bed Granulation Amount: None Present (0%) Exposed Structure Necrotic Amount: None Present (0%) Fascia Exposed: No Fat Layer (Subcutaneous Tissue) Exposed: No Tendon Exposed: No Muscle Exposed: No Joint Exposed: No Bone Exposed: No Periwound Skin Texture Texture Color No Abnormalities Noted: Yes No Abnormalities Noted: Yes Moisture No Abnormalities Noted: No Prasad, Duron ([id number]) Wound Preparation Ulcer Cleansing: Rinsed/Irrigated with Saline Electronic Signature(s) Signed: 02/09/2018 6:17:48 PM By: Elliot Gurney, BSN, RN, CWS, Kim RN, BSN Entered By: Elliot Gurney, BSN, RN, CWS, Kim on 02/06/2018 10:39:18 [name] ([id number]) -------------------------------------------------------------------------------- Wound Assessment Details Patient Name: [name] Date of Service: 02/06/2018 10:15 AM Medical Record Number: [id number] Patient Account Number: [id number] Date of Birth/Sex: Oct 01, 1955 (61 y.o. M) Treating RN: Rema Jasmine Primary Care Farrell Broerman: Franco Nones Other Clinician: Referring Nirvaan Frett: Franco Nones Treating Latoi Giraldo/Extender: STONE III, HOYT Weeks in Treatment: 5 Wound Status Wound Number: 5 Primary Pressure Ulcer Etiology: Wound Location: Right Gluteal fold Wound Open Wounding Event: Pressure Injury Status: Date Acquired: 12/15/2017 Comorbid Chronic sinus problems/congestion, Anemia, Weeks Of Treatment: 5 History: Arrhythmia, Hepatitis C, Type II Diabetes Clustered Wound: No Photos Photo Uploaded By: Rema Jasmine on 02/06/2018 10:52:00 Wound Measurements Length: (cm) 1 Width: (cm) 1.3 Depth: (cm) 0.1 Area: (cm) 1.021 Volume: (cm) 0.102 % Reduction in Area: 91.9% % Reduction in Volume: 91.9% Tunneling: No Undermining: No Wound Description Classification: Category/Stage III Foul Odor After Cleansing: No Slough/Fibrino No Wound Bed Granulation Amount: Medium (34-66%) Exposed Structure Granulation  Quality: Red Fascia Exposed: No Fat Layer (Subcutaneous Tissue) Exposed: No Tendon Exposed: No Muscle Exposed: No Joint Exposed: No Bone Exposed: No Periwound Skin Texture Texture Color No Abnormalities Noted: Yes No Abnormalities Noted: No Erythema: Yes Moisture Erythema Location: Circumferential No Abnormalities Noted: No Viloria, Nayib ([id number]) Wound Preparation Ulcer Cleansing: Rinsed/Irrigated with Saline Topical Anesthetic Applied: Other: lidocaine 4%, Treatment Notes Wound #5 (Right Gluteal fold) 1. Cleansed with: Clean wound with Normal Saline 2. Anesthetic Topical Lidocaine 4% cream to wound bed prior to debridement 4. Dressing Applied: Hydrafera Blue 5. Secondary Dressing Applied Bordered Foam Dressing Electronic Signature(s) Signed: 02/09/2018 4:42:53 PM By: Rema Jasmine Entered By: Rema Jasmine on 02/06/2018 10:28:01 [name] ([id number]) -------------------------------------------------------------------------------- Vitals Details Patient Name: [name] Date of Service: 02/06/2018 10:15 AM Medical Record Number: [id number] Patient Account Number: [id number] Date of Birth/Sex: [birth year] (63 y.o. M) Treating RN: Rema Jasmine Primary Care Raiden Yearwood: Franco Nones Other Clinician: Referring Jeran Hiltz: Franco Nones Treating Dontrelle Mazon/Extender: STONE III, HOYT Weeks in Treatment: 5 Vital Signs Time Taken: 10:03 Temperature (F): 98.2 Height (in): 70 Pulse (bpm): 83 Weight (lbs): 155 Respiratory Rate (breaths/min): 18 Body Mass Index (BMI): 22.2 Blood Pressure (mmHg): 102/69 Reference Range: 80 - 120 mg / dl Electronic Signature(s) Signed: 02/09/2018 4:42:53 PM By: Rema Jasmine Entered By: Rema Jasmine on 02/06/2018 10:04:14

## 2018-02-20 ENCOUNTER — Ambulatory Visit: Payer: Medicaid Other | Admitting: Nurse Practitioner

## 2018-02-21 NOTE — Progress Notes (Signed)
[name], [name] ([id number]) Visit Report for 02/06/2018 Chief Complaint Document Details Patient Name: [name], [name] Date of Service: 02/06/2018 10:15 AM Medical Record Number: [id number] Patient Account Number: [id number] Date of Birth/Sex: [birth year] (62 y.o. M) Treating RN: Phillis Haggis Primary Care Provider: Franco Nones Other Clinician: Referring Provider: Franco Nones Treating Provider/Extender: Linwood Dibbles, HOYT Weeks in Treatment: 5 Information Obtained from: Patient Chief Complaint Bilateral Gluteal Ulcers Electronic Signature(s) Signed: 02/06/2018 9:24:47 PM By: Lenda Kelp PA-C Entered By: Lenda Kelp on 02/06/2018 10:44:46 [name] ([id number]) -------------------------------------------------------------------------------- HPI Details Patient Name: [name] Date of Service: 02/06/2018 10:15 AM Medical Record Number: [id number] Patient Account Number: [id number] Date of Birth/Sex: March 09, 1956 (61 y.o. M) Treating RN: Phillis Haggis Primary Care Provider: Franco Nones Other Clinician: Referring Provider: Franco Nones Treating Provider/Extender: Linwood Dibbles, HOYT Weeks in Treatment: 5 History of Present Illness HPI Description: 09/14/17-he is here for evaluation of the left lower extremity injury. He is a resident of a group home and is accompanied by facility staff. He went to his primary care on 2/12 for a left lower extremity wound. At that appointment a wound culture was taken. A referral for wound care services was ordered on 2/26 and according to facility med rec he was started on Bactrim on 3/5. The patient and facility staff are very poor historians. It is assumed that the injury to the left leg was from scratching. He presents today with no open area, no erythema, no evidence of drainage. He is going to PCP later today for follow up. Readmission: 01/02/18 on evaluation today patient presents for reevaluation in the clinic although this  is for a separate issue. He actually has several states to the pressure ulcers along his bilateral gluteal region which apparently have been present for several weeks now. He did see his primary care provider two weeks ago and they did initiate treatment. Home health was finally obtained although they were having a difficult time getting home health. It sounds like doing is actually used for the wound beds obviously don't think this is gonna be the best treatment option for him at this point. Fortunately there does not appear to be any evidence of infection at this time the patient does have discomfort but not as much as he apparently had in the past. When he was originally dealing with these before going to see his primary care provider the pain was much more significant. He does have a foam cushion for his wheelchair although I think being that he is wheelchair dependent he may benefit from a Roho cushion at this point. He also may benefit from an air mattress in my pinion he does have a hospital bed but again between the bed and his wheelchair he pretty much spends all of his time during the day and one of these two locations. I do think he staying too long in the wheelchair as far as sitting up as well which also I think needs to be addressed I did have a long conversation with him today concerning the fact that he needs to rotate positions and locations at least every two hours. This is something we're going to put on the notes as well to sin with him to the facility. I do think that this is going to be of utmost importance the dressings can be helpful but they are not gonna be able to manage this completely alone. He does have some hyper granular tissue noted. 01/09/18 on evaluation today patient presents for reevaluation concerning  multiple pressure ulcers noted of the bilateral gluteal region. He has been tolerating the dressing changes without complication the good news is he seems to be  making great progress at this point in time. There does not appear to be any evidence of infection at this time. No fevers, chills, nausea, or vomiting noted at this time. 01/23/18 Patient's wounds seem to show signs of good improvement at this point in time. He has been tolerating the dressing changes without complication. With that being said I do believe that he has been making excellent progress over the past several weeks. No fevers, chills, nausea, or vomiting noted at this time. 02/06/18 on evaluation today patient appears to be doing much better in regard to his gluteal and Ischial ulcers. He's been tolerating the dressing changes without complication the Hydrofera Blue Dressing be doing excellent for him. Overall I'm very pleased in this regard. He's having no significant pain. Electronic Signature(s) Signed: 02/06/2018 9:24:47 PM By: Lenda Kelp PA-C Entered By: Lenda Kelp on 02/06/2018 10:45:05 [name] ([id number]) -------------------------------------------------------------------------------- Physical Exam Details Patient Name: [name] Date of Service: 02/06/2018 10:15 AM Medical Record Number: [id number] Patient Account Number: [id number] Date of Birth/Sex: 12/11/1955 (61 y.o. M) Treating RN: Phillis Haggis Primary Care Provider: Franco Nones Other Clinician: Referring Provider: Franco Nones Treating Provider/Extender: STONE III, HOYT Weeks in Treatment: 5 Constitutional Well-nourished and well-hydrated in no acute distress. Respiratory normal breathing without difficulty. clear to auscultation bilaterally. Cardiovascular regular rate and rhythm with normal S1, S2. Psychiatric this patient is able to make decisions and demonstrates good insight into disease process. Alert and Oriented x 3. pleasant and cooperative. Notes Patient's wounds at this point shows excellent granulation there does not appear to be any evidence of infection. The  good news is his wounds are making great progress one of the areas is actually healed as of today. Electronic Signature(s) Signed: 02/06/2018 9:24:47 PM By: Lenda Kelp PA-C Entered By: Lenda Kelp on 02/06/2018 10:46:43 [name] ([id number]) -------------------------------------------------------------------------------- Physician Orders Details Patient Name: [name] Date of Service: 02/06/2018 10:15 AM Medical Record Number: [id number] Patient Account Number: [id number] Date of Birth/Sex: 1955-09-05 (61 y.o. 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 o Cleanse wound with mild soap and water o May Shower, gently pat wound dry prior to applying new dressing. Wound #2 Left Gluteus o Clean wound with Normal Saline. o Cleanse wound with mild soap and water o May Shower, gently pat wound dry prior to applying new dressing. Wound #3 Right Ischium o Clean wound with Normal Saline. o Cleanse wound with mild soap and water o May Shower, gently pat wound dry prior to applying new dressing. Wound #5 Right Gluteal fold o Clean wound with Normal Saline. o Cleanse wound with mild soap and water o May Shower, gently pat wound dry prior to applying new dressing. Anesthetic (add to Medication List) Wound #1 Right Gluteus o Topical Lidocaine 4% cream applied  to wound bed prior to debridement (In Clinic Only).  Wound #2 Left Gluteus o Topical Lidocaine 4% cream applied to wound bed prior to debridement (In Clinic Only). Wound #3 Right Ischium o Topical Lidocaine 4% cream applied to wound bed prior to debridement (In Clinic Only). Wound #5 Right Gluteal fold o Topical Lidocaine 4% cream applied to wound bed prior to debridement (In Clinic Only). [name], [name] ([id number]) Primary Wound Dressing Wound #1 Right Gluteus o Hydrafera Blue Ready Transfer Wound #2 Left Gluteus o Hydrafera Blue Ready Transfer Wound #3 Right Ischium o Hydrafera Blue Ready Transfer Wound #5 Right Gluteal fold o Hydrafera Blue Ready Transfer Secondary Dressing Wound #1 Right Gluteus o Boardered Foam Dressing Wound #2 Left Gluteus o Boardered Foam Dressing Wound #3 Right Ischium o Boardered Foam Dressing Wound #5 Right Gluteal fold o Boardered Foam Dressing Dressing Change Frequency Wound #1 Right Gluteus o Change Dressing Monday, Wednesday, Friday - and as needed Wound #2 Left Gluteus o Change Dressing Monday, Wednesday, Friday - and as needed Wound #3 Right Ischium o Change Dressing Monday, Wednesday, Friday - and as needed Wound #5 Right Gluteal fold o Change Dressing Monday, Wednesday, Friday - and as needed Follow-up Appointments Wound #1 Right Gluteus o Return Appointment in 2 weeks. Wound #2 Left Gluteus o Return Appointment in 2 weeks. Wound #3 Right Ischium o Return Appointment in 2 weeks. Wound #5 Right Gluteal fold o Return Appointment in 2 weeks. Off-Loading Wound #1 Right Gluteus [name], [name] ([id number]) o Roho [facility] o Mattress - [facility] o Turn and reposition every 2 hours Wound #2 Left Gluteus o Roho cushion for wheelchair - [facility] o Matt[facility] o Turn and reposition every 2 hours Wound #3 Right Ischium o Roho cushion for wheelchair - [facility] o Mattress - ordered by [facility] o Turn and reposition every 2 hours Wound #5 Right Gluteal fold o Roho cushion for wheelchair - [facility] o Mattress - [facility] o Turn and reposition every 2 hours Additional Orders / Instructions Wound #1 Right Gluteus o Increase protein intake. o Activity as tolerated Wound #2 Left Gluteus o Increase protein intake. o Activity as tolerated Wound #3 Right Ischium o Increase protein intake. o Activity as tolerated Wound #5 Right Gluteal fold o Increase protein intake. o Activity as tolerated Home Health Wound #1 Right Gluteus o Continue Home Health Visits - [facility] o Home Health Nurse may visit PRN to address patientos wound care needs. o FACE TO FACE ENCOUNTER: MEDICARE and MEDICAID PATIENTS: I certify that this patient is under my care and that I had a face-to-face encounter that meets the physician face-to-face encounter requirements with this patient on this date. The encounter with the patient was in whole or in part for the following MEDICAL CONDITION: (primary reason for Home Healthcare) MEDICAL NECESSITY: I certify, that based on my findings, NURSING services are a medically necessary home health service. HOME BOUND STATUS: I certify that my clinical findings support that this patient is homebound (i.e., Due to illness or injury, pt requires aid of supportive devices such as crutches, cane, wheelchairs, walkers, the use of special transportation or the assistance of another person to leave their place of residence. There is a normal inability to leave the home and doing so requires considerable and taxing effort. Other absences are for  medical reasons / religious services and are infrequent  or of short duration when for other reasons). o If current dressing causes regression in wound condition, may D/C ordered dressing product/s and apply Normal Saline Moist Dressing daily until next Wound Healing Center / Other MD appointment. Notify Wound Healing Center of regression in wound condition at [phone]. o Please direct any NON-WOUND related issues/requests for orders to patient's Primary Care Physician Wound #2 Left Gluteus o Continue Home Health Visits - [facility] [name], Crawford ([id number]) o Home Health Nurse may visit PRN to address patientos wound care needs. o FACE TO FACE ENCOUNTER: MEDICARE and MEDICAID PATIENTS: I certify that this patient is under my care and that I had a face-to-face encounter that meets the physician face-to-face encounter requirements with this patient on this date. The encounter with the patient was in whole or in part for the following MEDICAL CONDITION: (primary reason for Home Healthcare) MEDICAL NECESSITY: I certify, that based on my findings, NURSING services are a medically necessary home health service. HOME BOUND STATUS: I certify that my clinical findings support that this patient is homebound (i.e., Due to illness or injury, pt requires aid of supportive devices such as crutches, cane, wheelchairs, walkers, the use of special transportation or the assistance of another person to leave their place of residence. There is a normal inability to leave the home and doing so requires considerable and taxing effort. Other absences are for medical reasons / religious services and are infrequent or of short duration when for other reasons). o If current dressing causes regression in wound condition, may D/C ordered dressing product/s and apply Normal Saline Moist Dressing daily until next Wound Healing Center / Other MD appointment. Notify Wound Healing Center of regression in wound condition at [phone]. o Please direct any  NON-WOUND related issues/requests for orders to patient's Primary Care Physician Wound #3 Right Ischium o Continue Home Health Visits - [facility] o Home Health Nurse may visit PRN to address patientos wound care needs. o FACE TO FACE ENCOUNTER: MEDICARE and MEDICAID PATIENTS: I certify that this patient is under my care and that I had a face-to-face encounter that meets the physician face-to-face encounter requirements with this patient on this date. The encounter with the patient was in whole or in part for the following MEDICAL CONDITION: (primary reason for Home Healthcare) MEDICAL NECESSITY: I certify, that based on my findings, NURSING services are a medically necessary home health service. HOME BOUND STATUS: I certify that my clinical findings support that this patient is homebound (i.e., Due to illness or injury, pt requires aid of supportive devices such as crutches, cane, wheelchairs, walkers, the use of special transportation or the assistance of another person to leave their place of residence. There is a normal inability to leave the home and doing so requires considerable and taxing effort. Other absences are for medical reasons / religious services and are infrequent or of short duration when for other reasons). o If current dressing causes regression in wound condition, may D/C ordered dressing product/s and apply Normal Saline Moist Dressing daily until next Wound Healing Center / Other MD appointment. Notify Wound Healing Center of regression in wound condition at [phone]. o Please direct any NON-WOUND related issues/requests for orders to patient's Primary Care Physician Wound #5 Right Gluteal fold o Continue Home Health Visits - [facility] o Home Health Nurse may visit PRN to address patientos wound care needs. o FACE TO FACE ENCOUNTER: MEDICARE and MEDICAID PATIENTS: I certify that this  patient is under my care and that I had a face-to-face encounter that  meets the physician face-to-face encounter requirements with this patient on this date. The encounter with the patient was in whole or in part for the following MEDICAL CONDITION: (primary reason for Home Healthcare) MEDICAL NECESSITY: I certify, that based on my findings, NURSING services are a medically necessary home health service. HOME BOUND STATUS: I certify that my clinical findings support that this patient is homebound (i.e., Due to illness or injury, pt requires aid of supportive devices such as crutches, cane, wheelchairs, walkers, the use of special transportation or the assistance of another person to leave their place of residence. There is a normal inability to leave the home and doing so requires considerable and taxing effort. Other absences are for medical reasons / religious services and are infrequent or of short duration when for other reasons). o If current dressing causes regression in wound condition, may D/C ordered dressing product/s and apply Normal Saline Moist Dressing daily until next Wound Healing Center / Other MD appointment. Notify Wound Healing Center of regression in wound condition at [phone]. o Please direct any NON-WOUND related issues/requests for orders to patient's Primary Care Physician Electronic Signature(s) Signed: 02/06/2018 10:44:59 AM By: Elliot Gurney, BSN, RN, CWS, Kim RN, BSN Signed: 02/06/2018 9:24:47 PM By: Lenda Kelp PA-C Entered By: Elliot Gurney, BSN, RN, CWS, Kim on 02/06/2018 10:44:52 [name], [name] ([id number]) [name], [name] ([id number]) -------------------------------------------------------------------------------- Problem List Details Patient Name: [name] Date of Service: 02/06/2018 10:15 AM Medical Record Number: [id number] Patient Account Number: [id number] Date of Birth/Sex: 1955-09-09 (61 y.o. 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 01/02/2018 No Yes F17.210 Nicotine dependence, cigarettes, uncomplicated 01/02/2018 No Yes K70.30 Alcoholic cirrhosis of liver without ascites 01/02/2018 No Yes Inactive Problems Resolved Problems Electronic Signature(s) Signed: 02/06/2018 9:24:47 PM By: Lenda Kelp PA-C Entered By: Lenda Kelp on 02/06/2018 10:44:23 [name] ([id number]) -------------------------------------------------------------------------------- Progress Note Details Patient Name: [name] Date of Service: 02/06/2018 10:15 AM Medical Record Number: [id number] Patient Account Number: [id number] Date of Birth/Sex: Aug 08, 1955 (61 y.o. M) Treating RN: Phillis Haggis Primary Care Provider: Franco Nones Other Clinician: Referring Provider: Franco Nones Treating Provider/Extender: Linwood Dibbles, HOYT Weeks in Treatment: 5 Subjective Chief Complaint Information obtained from Patient Bilateral Gluteal Ulcers History of Present Illness (HPI) 09/14/17-he is here for evaluation of the left lower extremity injury. He is a resident of a group home and is accompanied by facility staff. He went to his primary care on 2/12 for a left lower extremity wound. At that appointment a wound culture was taken. A referral for wound care services was ordered on 2/26 and according to facility med rec he was started on Bactrim on 3/5. The patient and facility staff are very poor historians. It is assumed that the injury to the left leg was from scratching. He presents today with no open area, no  erythema, no evidence of drainage. He is going to PCP later today for follow up. Readmission: 01/02/18 on evaluation today patient presents for reevaluation in the clinic although this is  for a separate issue. He actually has several states to the pressure ulcers along his bilateral gluteal region which apparently have been present for several weeks now. He did see his primary care provider two weeks ago and they did initiate treatment. Home health was finally obtained although they were having a difficult time getting home health. It sounds like doing is actually used for the wound beds obviously don't think this is gonna be the best treatment option for him at this point. Fortunately there does not appear to be any evidence of infection at this time the patient does have discomfort but not as much as he apparently had in the past. When he was originally dealing with these before going to see his primary care provider the pain was much more significant. He does have a foam cushion for his wheelchair although I think being that he is wheelchair dependent he may benefit from a Roho cushion at this point. He also may benefit from an air mattress in my pinion he does have a hospital bed but again between the bed and his wheelchair he pretty much spends all of his time during the day and one of these two locations. I do think he staying too long in the wheelchair as far as sitting up as well which also I think needs to be addressed I did have a long conversation with him today concerning the fact that he needs to rotate positions and locations at least every two hours. This is something we're going to put on the notes as well to sin with him to the facility. I do think that this is going to be of utmost importance the dressings can be helpful but they are not gonna be able to manage this completely alone. He does have some hyper granular tissue noted. 01/09/18 on evaluation today patient presents for  reevaluation concerning multiple pressure ulcers noted of the bilateral gluteal region. He has been tolerating the dressing changes without complication the good news is he seems to be making great progress at this point in time. There does not appear to be any evidence of infection at this time. No fevers, chills, nausea, or vomiting noted at this time. 01/23/18 Patient's wounds seem to show signs of good improvement at this point in time. He has been tolerating the dressing changes without complication. With that being said I do believe that he has been making excellent progress over the past several weeks. No fevers, chills, nausea, or vomiting noted at this time. 02/06/18 on evaluation today patient appears to be doing much better in regard to his gluteal and Ischial ulcers. He's been tolerating the dressing changes without complication the Hydrofera Blue Dressing be doing excellent for him. Overall I'm very pleased in this regard. He's having no significant pain. Patient History Information obtained from Patient. [name], [name] ([id number]) Family History Heart Disease - Siblings, Hypertension - Siblings, Lung Disease - Siblings, No family history of Cancer, Diabetes, Hereditary Spherocytosis, Kidney Disease, Seizures, Stroke, Thyroid Problems, Tuberculosis. Social History Current every day smoker, Marital Status - Single, Alcohol Use - Never - history of ETOH abuse, Drug Use - Prior History, Caffeine Use - Daily. Medical And Surgical History Notes Gastrointestinal Colostomy Musculoskeletal R AKA Review of Systems (ROS) Constitutional Symptoms (General Health) Denies complaints or symptoms of Fever, Chills. Respiratory The patient has no complaints or symptoms. Cardiovascular The patient has no complaints or symptoms. Psychiatric The patient has no complaints or symptoms. Objective Constitutional Well-nourished and well-hydrated in no  acute distress. Vitals Time Taken: 10:03  AM, Height: 70 in, Weight: 155 lbs, BMI: 22.2, Temperature: 98.2 F, Pulse: 83 bpm, Respiratory Rate: 18 breaths/min, Blood Pressure: 102/69 mmHg. Respiratory normal breathing without difficulty. clear to auscultation bilaterally. Cardiovascular regular rate and rhythm with normal S1, S2. Psychiatric this patient is able to make decisions and demonstrates good insight into disease process. Alert and Oriented x 3. pleasant and cooperative. General Notes: Patient's wounds at this point shows excellent granulation there does not appear to be any evidence of infection. The good news is his wounds are making great progress one of the areas is actually healed as of today. Integumentary (Hair, Skin) Wound #1 status is Open. Original cause of wound was Pressure Injury. The wound is located on the Right Gluteus. The wound measures 2.5cm length x 1cm width x 0.1cm depth; 1.963cm^2 area and 0.196cm^3 volume. There is no tunneling or [place]ow, [name] ([id number]) undermining noted. There is a small amount of sanguinous drainage noted. There is small (1-33%) red, pink granulation within the wound bed. There is no necrotic tissue within the wound bed. The periwound skin appearance exhibited: Erythema. The surrounding wound skin color is noted with erythema which is circumferential. Wound #2 status is Open. Original cause of wound was Pressure Injury. The wound is located on the Left Gluteus. The wound measures 1.9cm length x 0.9cm width x 0.1cm depth; 1.343cm^2 area and 0.134cm^3 volume. There is no tunneling or undermining noted. There is large (67-100%) red granulation within the wound bed. There is no necrotic tissue within the wound bed. The periwound skin appearance exhibited: Erythema. The surrounding wound skin color is noted with erythema which is circumferential. Wound #3 status is Open. Original cause of wound was Pressure Injury. The wound is located on the Right Ischium. The wound measures 2.6cm  length x 4cm width x 0.1cm depth; 8.168cm^2 area and 0.817cm^3 volume. There is no tunneling or undermining noted. There is a small amount of sanguinous drainage noted. There is large (67-100%) red granulation within the wound bed. Wound #4 status is Healed - Epithelialized. Original cause of wound was Pressure Injury. The wound is located on the Left Ischium. The wound measures 0cm length x 0cm width x 0cm depth; 0cm^2 area and 0cm^3 volume. There is no undermining noted. There is a none present amount of drainage noted. There is no granulation within the wound bed. There is no necrotic tissue within the wound bed. The periwound skin appearance had no abnormalities noted for texture. The periwound skin appearance had no abnormalities noted for color. Wound #5 status is Open. Original cause of wound was Pressure Injury. The wound is located on the Right Gluteal fold. The wound measures 1cm length x 1.3cm width x 0.1cm depth; 1.021cm^2 area and 0.102cm^3 volume. There is no tunneling or undermining noted. There is medium (34-66%) red granulation within the wound bed. The periwound skin appearance had no abnormalities noted for texture. The periwound skin appearance exhibited: Erythema. The surrounding wound skin color is noted with erythema which is circumferential. 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 Cleanse wound with mild soap and water May Shower, gently pat wound dry prior to applying new dressing. Wound #2 Left Gluteus: Clean wound with Normal Saline.  Cleanse wound with mild soap and water [name], [name] ([id number]) May Shower, gently pat wound dry  prior to applying new dressing. Wound #3 Right Ischium: Clean wound with Normal Saline. Cleanse wound with mild soap and water May Shower, gently pat wound dry prior to applying new dressing. Wound #5 Right Gluteal fold: Clean wound with Normal Saline. Cleanse wound with mild soap and water May Shower, gently pat wound dry prior to applying new dressing. Anesthetic (add to Medication List): Wound #1 Right Gluteus: Topical Lidocaine 4% cream applied to wound bed prior to debridement (In Clinic Only). Wound #2 Left Gluteus: Topical Lidocaine 4% cream applied to wound bed prior to debridement (In Clinic Only). Wound #3 Right Ischium: Topical Lidocaine 4% cream applied to wound bed prior to debridement (In Clinic Only). Wound #5 Right Gluteal fold: Topical Lidocaine 4% cream applied to wound bed prior to debridement (In Clinic Only). Primary Wound Dressing: Wound #1 Right Gluteus: Hydrafera Blue Ready Transfer Wound #2 Left Gluteus: Hydrafera Blue Ready Transfer Wound #3 Right Ischium: Hydrafera Blue Ready Transfer Wound #5 Right Gluteal fold: Hydrafera Blue Ready Transfer Secondary Dressing: Wound #1 Right Gluteus: Boardered Foam Dressing Wound #2 Left Gluteus: Boardered Foam Dressing Wound #3 Right Ischium: Boardered Foam Dressing Wound #5 Right Gluteal fold: Boardered Foam Dressing Dressing Change Frequency: Wound #1 Right Gluteus: Change Dressing Monday, Wednesday, Friday - and as needed Wound #2 Left Gluteus: Change Dressing Monday, Wednesday, Friday - and as needed Wound #3 Right Ischium: Change Dressing Monday, Wednesday, Friday - and as needed Wound #5 Right Gluteal fold: Change Dressing Monday, Wednesday, Friday - and as needed Follow-up Appointments: Wound #1 Right Gluteus: Return Appointment in 2 weeks. Wound #2 Left Gluteus: Return Appointment in 2 weeks. Wound #3 Right Ischium: Return Appointment in 2 weeks. Wound #5 Right Gluteal fold: Return Appointment  in 2 weeks. Off-Loading: Wound #1 Right Gluteus: Roho cushion for wheelchair - ordered by [facility] [place] - ordered by [facility] Turn and reposition every 2 hours Wound #2 Left Gluteus: [name], [name] ([id number]) Roho cushion for wheelchair - ordered by [facility] [place] - ordered by [facility] Turn and reposition every 2 hours Wound #3 Right Ischium: Roho cushion for wheelchair - ordered by [facility] [employer] - ordered by [facility] Turn and reposition every 2 hours Wound #5 Right Gluteal fold: Roho cushion for wheelchair - ordered by [facility] [place] - ordered by [facility] Turn and reposition every 2 hours Additional Orders / Instructions: Wound #1 Right Gluteus: Increase protein intake. Activity as tolerated Wound #2 Left Gluteus: Increase protein intake. Activity as tolerated Wound #3 Right Ischium: Increase protein intake. Activity as tolerated Wound #5 Right Gluteal fold: Increase protein intake. Activity as tolerated Home Health: Wound #1 Right Gluteus: Continue Home Health Visits - [facility] Nurse may visit PRN to address patient s wound care needs. FACE TO FACE ENCOUNTER: MEDICARE and MEDICAID PATIENTS: I certify that this patient is under my care and that I had a face-to-face encounter that meets the physician face-to-face encounter requirements with this patient on this date. The encounter with the patient was in whole or in part for the following MEDICAL CONDITION: (primary reason for Home Healthcare) MEDICAL NECESSITY: I certify, that based on my findings, NURSING services are a medically necessary home health service. HOME BOUND STATUS: I certify that my clinical findings support that this patient is homebound (  i.e., Due to illness or injury, pt requires aid of supportive devices such as crutches, cane, wheelchairs,  walkers, the use of special transportation or the assistance of another person to leave their place of residence. There is a normal inability to leave the home and doing so requires considerable and taxing effort. Other absences are for medical reasons / religious services and are infrequent or of short duration when for other reasons). If current dressing causes regression in wound condition, may D/C ordered dressing product/s and apply Normal Saline Moist Dressing daily until next Wound Healing Center / Other MD appointment. Notify Wound Healing Center of regression in wound condition at [phone]. Please direct any NON-WOUND related issues/requests for orders to patient's Primary Care Physician Wound #2 Left Gluteus: Continue Home Health Visits - [facility] Nurse may visit PRN to address patient s wound care needs. FACE TO FACE ENCOUNTER: MEDICARE and MEDICAID PATIENTS: I certify that this patient is under my care and that I had a face-to-face encounter that meets the physician face-to-face encounter requirements with this patient on this date. The encounter with the patient was in whole or in part for the following MEDICAL CONDITION: (primary reason for Home Healthcare) MEDICAL NECESSITY: I certify, that based on my findings, NURSING services are a medically necessary home health service. HOME BOUND STATUS: I certify that my clinical findings support that this patient is homebound (i.e., Due to illness or injury, pt requires aid of supportive devices such as crutches, cane, wheelchairs, walkers, the use of special transportation or the assistance of another person to leave their place of residence. There is a normal inability to leave the home and doing so requires considerable and taxing effort. Other absences are for medical reasons / religious services and are infrequent or of short duration when for other reasons). If current dressing causes regression in wound condition,  may D/C ordered dressing product/s and apply Normal Saline Moist Dressing daily until next Wound Healing Center / Other MD appointment. Notify Wound Healing Center of regression in wound condition at [phone]. Please direct any NON-WOUND related issues/requests for orders to patient's Primary Care Physician Wound #3 Right Ischium: Continue Home Health Visits - [facility] Nurse may visit PRN to address patient s wound care needs. [name], MIKELSON ([id number]) FACE TO FACE ENCOUNTER: MEDICARE and MEDICAID PATIENTS: I certify that this patient is under my care and that I had a face-to-face encounter that meets the physician face-to-face encounter requirements with this patient on this date. The encounter with the patient was in whole or in part for the following MEDICAL CONDITION: (primary reason for Home Healthcare) MEDICAL NECESSITY: I certify, that based on my findings, NURSING services are a medically necessary home health service. HOME BOUND STATUS: I certify that my clinical findings support that this patient is homebound (i.e., Due to illness or injury, pt requires aid of supportive devices such as crutches, cane, wheelchairs, walkers, the use of special transportation or the assistance of another person to leave their place of residence. There is a normal inability to leave the home and doing so requires considerable and taxing effort. Other absences are for medical reasons / religious services and are infrequent or of short duration when for other reasons). If current dressing causes regression in wound condition, may D/C ordered dressing product/s and apply Normal Saline Moist Dressing daily until next Wound Healing Center / Other MD appointment. Notify Wound Healing Center of regression in wound condition at [phone]. Please direct any NON-WOUND related  issues/requests for orders to patient's Primary Care Physician Wound #5 Right Gluteal fold: Continue Home Health  Visits - [facility] Nurse may visit PRN to address patient s wound care needs. FACE TO FACE ENCOUNTER: MEDICARE and MEDICAID PATIENTS: I certify that this patient is under my care and that I had a face-to-face encounter that meets the physician face-to-face encounter requirements with this patient on this date. The encounter with the patient was in whole or in part for the following MEDICAL CONDITION: (primary reason for Home Healthcare) MEDICAL NECESSITY: I certify, that based on my findings, NURSING services are a medically necessary home health service. HOME BOUND STATUS: I certify that my clinical findings support that this patient is homebound (i.e., Due to illness or injury, pt requires aid of supportive devices such as crutches, cane, wheelchairs, walkers, the use of special transportation or the assistance of another person to leave their place of residence. There is a normal inability to leave the home and doing so requires considerable and taxing effort. Other absences are for medical reasons / religious services and are infrequent or of short duration when for other reasons). If current dressing causes regression in wound condition, may D/C ordered dressing product/s and apply Normal Saline Moist Dressing daily until next Wound Healing Center / Other MD appointment. Notify Wound Healing Center of regression in wound condition at [phone]. Please direct any NON-WOUND related issues/requests for orders to patient's Primary Care Physician I'm gonna suggest that we continue with the Current wound care measures in two weeks time. The patient is in agreement with plan. Otherwise if anything changes the let us know. Please see above for specific wound care orders. We will see patient for re-evaluation in 2 week(s) here in the clinic. If anything worsens or changes patient will contact our office for additional recommendations. Electronic Signature(s) Signed: 02/06/2018 9:24:47  PM By: Lenda Kelp PA-C Entered By: Lenda Kelp on 02/06/2018 10:50:05 [name] ([id number]) -------------------------------------------------------------------------------- ROS/PFSH Details Patient Name: [name] Date of Service: 02/06/2018 10:15 AM Medical Record Number: [id number] Patient Account Number: [id number] Date of Birth/Sex: March 06, 1956 (61 y.o. M) Treating RN: Phillis Haggis Primary Care Provider: Franco Nones Other Clinician: Referring Provider: Franco Nones Treating Provider/Extender: STONE III, HOYT Weeks in Treatment: 5 Information Obtained From Patient Wound History Do you currently have one or more open woundso Yes Approximately how long have you had your woundso 2 weeks How have you been treating your wound(s) until nowo Deuoderm Has your wound(s) ever healed and then re-openedo No Have you had any lab work done in the past montho No Have you tested positive for an antibiotic resistant organism (MRSA, VRE)o No Have you tested positive for osteomyelitis (bone infection)o No Have you had any tests for circulation on your legso No Constitutional Symptoms (General Health) Complaints and Symptoms: Negative for: Fever; Chills Eyes Medical History: Negative for: Cataracts; Glaucoma; Optic Neuritis Ear/Nose/Mouth/Throat Medical History: Positive for: Chronic sinus problems/congestion Negative for: Middle ear problems Hematologic/Lymphatic Medical History: Positive for: Anemia Negative for: Hemophilia; Human Immunodeficiency Virus; Lymphedema; Sickle Cell Disease Respiratory Complaints and Symptoms: No Complaints or Symptoms Medical History: Negative for: Aspiration; Asthma; Chronic Obstructive Pulmonary Disease (COPD); Pneumothorax; Sleep Apnea; Tuberculosis Cardiovascular Complaints and Symptoms: No Complaints or Symptoms Medical History: Positive for: Arrhythmia Peitz, Izaac ([id number]) Negative for: Angina; Congestive Heart  Failure; Coronary Artery Disease; Deep Vein Thrombosis; Hypertension; Hypotension; Myocardial Infarction; Peripheral Arterial Disease; Peripheral Venous Disease; Phlebitis; Vasculitis Gastrointestinal Medical History: Positive for: Hepatitis C Negative for:  Cirrhosis ; Colitis; Crohnos; Hepatitis A; Hepatitis B Past Medical History Notes: Colostomy Endocrine Medical History: Positive for: Type II Diabetes Time with diabetes: 1 year Treated with: Insulin Blood sugar tested every day: Yes Tested : Genitourinary Medical History: Negative for: End Stage Renal Disease Immunological Medical History: Negative for: Lupus Erythematosus; Raynaudos; Scleroderma Musculoskeletal Medical History: Past Medical History Notes: R AKA Neurologic Medical History: Negative for: Dementia; Neuropathy; Quadriplegia; Paraplegia; Seizure Disorder Psychiatric Complaints and Symptoms: No Complaints or Symptoms HBO Extended History Items Ear/Nose/Mouth/Throat: Chronic sinus problems/congestion Immunizations Pneumococcal Vaccine: Received Pneumococcal Vaccination: Yes Immunization Notes: up to date Implantable Devices Family and Social History BARTH, TRELLA ([id number]) Cancer: No; Diabetes: No; Heart Disease: Yes - Siblings; Hereditary Spherocytosis: No; Hypertension: Yes - Siblings; Kidney Disease: No; Lung Disease: Yes - Siblings; Seizures: No; Stroke: No; Thyroid Problems: No; Tuberculosis: No; Current every day smoker; Marital Status - Single; Alcohol Use: Never - history of ETOH abuse; Drug Use: Prior History; Caffeine Use: Daily; Financial Concerns: No; Food, Clothing or Shelter Needs: No; Support System Lacking: No; Transportation Concerns: No; Advanced Directives: No; Patient does not want information on Advanced Directives Physician Affirmation I have reviewed and agree with the above information. Electronic Signature(s) Signed: 02/06/2018 9:24:47 PM By: Lenda Kelp PA-C Signed:  02/12/2018 4:59:33 PM By: Alejandro Mulling Entered By: Lenda Kelp on 02/06/2018 10:45:29 [name] ([id number]) -------------------------------------------------------------------------------- SuperBill Details Patient Name: [name] Date of Service: 02/06/2018 Medical Record Number: [id number] Patient Account Number: [id number] Date of Birth/Sex: 1956/03/19 (61 y.o. M) Treating RN: Phillis Haggis Primary Care Provider: Franco Nones Other Clinician: Referring Provider: Franco Nones Treating Provider/Extender: STONE III, HOYT Weeks in Treatment: 5 Diagnosis Coding ICD-10 Codes Code Description L89.313 Pressure ulcer of right buttock, stage 3 L89.323 Pressure ulcer of left buttock, stage 3 Z99.3 Dependence on wheelchair Z93.3 Colostomy status Z89.611 Acquired absence of right leg above knee F17.210 Nicotine dependence, cigarettes, uncomplicated K70.30 Alcoholic cirrhosis of liver without ascites Facility Procedures CPT4 Code: [id number] Description: 99214 - WOUND CARE VISIT-LEV 4 EST PT Modifier: Quantity: 1 Physician Procedures CPT4 Code: [id number] Description: 99213 - WC PHYS LEVEL 3 - EST PT ICD-10 Diagnosis Description L89.313 Pressure ulcer of right buttock, stage 3 L89.323 Pressure ulcer of left buttock, stage 3 Z99.3 Dependence on wheelchair Z93.3 Colostomy status Modifier: Quantity: 1 Electronic Signature(s) Signed: 02/12/2018 1:43:31 AM By: Lenda Kelp PA-C Previous Signature: 02/06/2018 9:24:47 PM Version By: Lenda Kelp PA-C Entered By: Francie Massing on 02/08/2018 18:06:36

## 2018-03-05 ENCOUNTER — Encounter: Payer: Medicaid Other | Attending: Physician Assistant | Admitting: Physician Assistant

## 2018-03-05 DIAGNOSIS — K7030 Alcoholic cirrhosis of liver without ascites: Secondary | ICD-10-CM | POA: Insufficient documentation

## 2018-03-05 DIAGNOSIS — E119 Type 2 diabetes mellitus without complications: Secondary | ICD-10-CM | POA: Insufficient documentation

## 2018-03-05 DIAGNOSIS — F17210 Nicotine dependence, cigarettes, uncomplicated: Secondary | ICD-10-CM | POA: Insufficient documentation

## 2018-03-05 DIAGNOSIS — L89313 Pressure ulcer of right buttock, stage 3: Secondary | ICD-10-CM | POA: Diagnosis not present

## 2018-03-05 DIAGNOSIS — Z933 Colostomy status: Secondary | ICD-10-CM | POA: Insufficient documentation

## 2018-03-05 DIAGNOSIS — Z993 Dependence on wheelchair: Secondary | ICD-10-CM | POA: Diagnosis not present

## 2018-03-05 DIAGNOSIS — Z89611 Acquired absence of right leg above knee: Secondary | ICD-10-CM | POA: Insufficient documentation

## 2018-03-05 DIAGNOSIS — Z794 Long term (current) use of insulin: Secondary | ICD-10-CM | POA: Diagnosis not present

## 2018-03-22 ENCOUNTER — Encounter: Payer: Medicaid Other | Attending: Physician Assistant | Admitting: Physician Assistant

## 2018-03-22 DIAGNOSIS — E11622 Type 2 diabetes mellitus with other skin ulcer: Secondary | ICD-10-CM | POA: Diagnosis not present

## 2018-03-22 DIAGNOSIS — L89323 Pressure ulcer of left buttock, stage 3: Secondary | ICD-10-CM | POA: Diagnosis not present

## 2018-03-22 DIAGNOSIS — L89313 Pressure ulcer of right buttock, stage 3: Secondary | ICD-10-CM | POA: Diagnosis not present

## 2018-03-22 DIAGNOSIS — L89312 Pressure ulcer of right buttock, stage 2: Secondary | ICD-10-CM | POA: Diagnosis present

## 2018-03-22 DIAGNOSIS — Z89611 Acquired absence of right leg above knee: Secondary | ICD-10-CM | POA: Diagnosis not present

## 2018-03-22 DIAGNOSIS — F17210 Nicotine dependence, cigarettes, uncomplicated: Secondary | ICD-10-CM | POA: Insufficient documentation

## 2018-03-22 DIAGNOSIS — K7030 Alcoholic cirrhosis of liver without ascites: Secondary | ICD-10-CM | POA: Diagnosis not present

## 2018-03-22 DIAGNOSIS — Z993 Dependence on wheelchair: Secondary | ICD-10-CM | POA: Diagnosis not present

## 2018-03-22 DIAGNOSIS — Z933 Colostomy status: Secondary | ICD-10-CM | POA: Diagnosis not present

## 2018-03-27 NOTE — Progress Notes (Signed)
[name], [name] ([id number]) Visit Report for 03/22/2018 Chief Complaint Document Details Patient Name: [name], [name] Date of Service: 03/22/2018 1:30 PM Medical Record Number: [id number] Patient Account Number: [id number] Date of Birth/Sex: Dec 30, 1955 (62 y.o. M) Treating RN: Renne Crigler Primary Care Provider: Franco Nones Other Clinician: Referring Provider: Franco Nones Treating Provider/Extender: Linwood Dibbles, Arletta Lumadue Weeks in Treatment: 11 Information Obtained from: Patient Chief Complaint Bilateral Gluteal Ulcers Electronic Signature(s) Signed: 03/23/2018 8:54:03 AM By: Lenda Kelp PA-C Entered By: Lenda Kelp on 03/22/2018 13:50:54 [name] ([id number]) -------------------------------------------------------------------------------- HPI Details Patient Name: [name] Date of Service: 03/22/2018 1:30 PM Medical Record Number: [id number] Patient Account Number: [id number] Date of Birth/Sex: November 05, 1955 (62 y.o. M) Treating RN: Renne Crigler Primary Care Provider: Franco Nones Other Clinician: Referring Provider: Franco Nones Treating Provider/Extender: Linwood Dibbles, Kel Senn Weeks in Treatment: 11 History of Present Illness HPI Description: 09/14/17-he is here for evaluation of the left lower extremity injury. He is a resident of a group home and is accompanied by facility staff. He went to his primary care on 2/12 for a left lower extremity wound. At that appointment a wound culture was taken. A referral for wound care services was ordered on 2/26 and according to facility med rec he was started on Bactrim on 3/5. The patient and facility staff are very poor historians. It is assumed that the injury to the left leg was from scratching. He presents today with no open area, no erythema, no evidence of drainage. He is going to PCP later today for follow up. Readmission: 01/02/18 on evaluation today patient presents for reevaluation in the clinic although this  is for a separate issue. He actually has several states to the pressure ulcers along his bilateral gluteal region which apparently have been present for several weeks now. He did see his primary care provider two weeks ago and they did initiate treatment. Home health was finally obtained although they were having a difficult time getting home health. It sounds like doing is actually used for the wound beds obviously don't think this is gonna be the best treatment option for him at this point. Fortunately there does not appear to be any evidence of infection at this time the patient does have discomfort but not as much as he apparently had in the past. When he was originally dealing with these before going to see his primary care provider the pain was much more significant. He does have a foam cushion for his wheelchair although I think being that he is wheelchair dependent he may benefit from a Roho cushion at this point. He also may benefit from an air mattress in my pinion he does have a hospital bed but again between the bed and his wheelchair he pretty much spends all of his time during the day and one of these two locations. I do think he staying too long in the wheelchair as far as sitting up as well which also I think needs to be addressed I did have a long conversation with him today concerning the fact that he needs to rotate positions and locations at least every two hours. This is something we're going to put on the notes as well to sin with him to the facility. I do think that this is going to be of utmost importance the dressings can be helpful but they are not gonna be able to manage this completely alone. He does have some hyper granular tissue noted. 01/09/18 on evaluation today patient presents for reevaluation concerning  multiple pressure ulcers noted of the bilateral gluteal region. He has been tolerating the dressing changes without complication the good news is he seems to be  making great progress at this point in time. There does not appear to be any evidence of infection at this time. No fevers, chills, nausea, or vomiting noted at this time. 01/23/18 Patient's wounds seem to show signs of good improvement at this point in time. He has been tolerating the dressing changes without complication. With that being said I do believe that he has been making excellent progress over the past several weeks. No fevers, chills, nausea, or vomiting noted at this time. 02/06/18 on evaluation today patient appears to be doing much better in regard to his gluteal and Ischial ulcers. He's been tolerating the dressing changes without complication the Hydrofera Blue Dressing be doing excellent for him. Overall I'm very pleased in this regard. He's having no significant pain. 03/07/18 on evaluation today patient appears to be doing much better in regard to his ulcers in the sacral, gluteal, Ischial locations. Overall in fact he only has two wounds remaining that appear to be open. This is the right gluteus and right ischial locations. Other than this everything that I have been managing up to this point appears greatly improved. Overall the patient states he's having minimal pain. He does have a foam cushion for his wheelchair. 03/22/18 on evaluation today patient actually appears to be doing much better in regard to his gluteal/Ischial wounds. In fact he just has one area remaining at this point in time in the right gluteal region. Fortunately he has made excellent progress at this point. [name] ([id number]) Electronic Signature(s) Signed: 03/23/2018 8:54:03 AM By: Lenda Kelp PA-C Entered By: Lenda Kelp on 03/22/2018 14:43:48 [name], [name] ([id number]) -------------------------------------------------------------------------------- Physical Exam Details Patient Name: [name] Date of Service: 03/22/2018 1:30 PM Medical Record Number: [id number] Patient Account  Number: [id number] Date of Birth/Sex: 1955/09/27 (62 y.o. M) Treating RN: Renne Crigler Primary Care Provider: Franco Nones Other Clinician: Referring Provider: Franco Nones Treating Provider/Extender: STONE III, Donette Mainwaring Weeks in Treatment: 11 Constitutional Well-nourished and well-hydrated in no acute distress. Respiratory normal breathing without difficulty. clear to auscultation bilaterally. Cardiovascular regular rate and rhythm with normal S1, S2. Psychiatric this patient is able to make decisions and demonstrates good insight into disease process. Alert and Oriented x 3. pleasant and cooperative. Notes Patient's wound bed did have some Slough noted on the surface of the wound which was mechanically debrided away with saline and gauze she tolerated this without complication post debridement the wound bed appears to be doing significantly better. Overall it's healthy I do think the [facility] Dressing for free to continue. Electronic Signature(s) Signed: 03/23/2018 8:54:03 AM By: Lenda Kelp PA-C Entered By: Lenda Kelp on 03/22/2018 14:44:23 [name] ([id number]) -------------------------------------------------------------------------------- Physician Orders Details Patient Name: [name] Date of Service: 03/22/2018 1:30 PM Medical Record Number: [id number] Patient Account Number: [id number] Date of Birth/Sex: 09-Dec-1955 (62 y.o. 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 o Cleanse wound with mild soap and water o May Shower, gently pat wound dry prior to applying new dressing. Anesthetic (add to Medication List) Wound #3 Right Ischium o Topical Lidocaine 4% cream applied to wound bed prior to debridement (In Clinic Only). Primary Wound Dressing Wound #3 Right Ischium o Hydrafera Blue Ready Transfer Secondary Dressing Wound #3 Right Ischium o Boardered Foam Dressing Dressing Change Frequency Wound #3 Right Ischium o Change Dressing Monday, Wednesday, Friday - and as needed Follow-up Appointments Wound #3 Right Ischium o Return Appointment in 2 weeks. Off-Loading Wound #3 Right Ischium o Turn and reposition every 2 hours [name], [name] ([id number]) Additional Orders / Instructions Wound #3 Right Ischium o Increase protein intake. o Activity as tolerated Home Health Wound #3 Right Ischium o Continue Home Health Visits - [facility] o Home Health Nurse may visit PRN to address patientos wound care needs. o FACE TO FACE ENCOUNTER: MEDICARE and MEDICAID PATIENTS: I certify that this patient is under my care and that I had a face-to-face encounter that meets the physician face-to-face encounter requirements with this patient on this date. The encounter with the patient was in whole or in part for the following MEDICAL CONDITION: (primary reason for Home Healthcare) MEDICAL NECESSITY: I certify, that based on my findings, NURSING services are a medically necessary home health service. HOME BOUND STATUS: I certify that my clinical findings support that this patient is homebound (i.e., Due to illness or injury, pt requires aid of supportive devices such as crutches, cane, wheelchairs, walkers, the use of special transportation or the assistance of another person to leave their place of  residence. There is a normal inability to leave the home and doing so requires considerable and taxing effort. Other absences are for medical reasons / religious services and are infrequent or of short duration when for other reasons). o If current dressing causes regression in wound condition, may D/C ordered dressing product/s and apply Normal Saline Moist Dressing daily until next Wound Healing Center / Other MD appointment. Notify Wound Healing Center of regression in wound condition at [phone]. o Please direct any NON-WOUND related issues/requests for orders to patient's Primary Care Physician Electronic Signature(s) Signed: 03/23/2018 8:12:43 AM By: Renne CriglerFlinchum, Cheryl Signed: 03/23/2018 8:54:03 AM By: Lenda KelpStone III, Thad Osoria PA-C Entered By: Renne CriglerFlinchum, Cheryl on 03/22/2018 14:34:36 Shawn SchlichterANDLES, [name] ([id number]) -------------------------------------------------------------------------------- Problem List Details Patient Name: Shawn SchlichterANDLES, [name] Date of Service: 03/22/2018 1:30 PM Medical Record Number: [id number] Patient Account Number: [id number] Date of Birth/Sex: [birth year] (62 y.o. 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 01/02/2018 No Yes F17.210 Nicotine dependence, cigarettes, uncomplicated 01/02/2018 No Yes K70.30 Alcoholic cirrhosis of liver without ascites 01/02/2018 No Yes Inactive Problems Resolved Problems Electronic Signature(s) Signed: 03/23/2018 8:54:03 AM By: Lenda KelpStone III, Edin Kon  PA-C Entered By: Lenda KelpStone III, Waylan Busta on 03/22/2018 13:50:48 Shawn SchlichterANDLES, [name] ([id number]) -------------------------------------------------------------------------------- Progress Note Details Patient Name: Shawn SchlichterANDLES, Musa Date of Service: 03/22/2018 1:30 PM Medical Record Number: [id number] Patient Account Number: [id number] Date of Birth/Sex: [birth year] (62 y.o. M) Treating RN: Renne CriglerFlinchum, Cheryl Primary Care Provider: Franco NonesLINDLEY, CHERYL Other  Clinician: Referring Provider: Franco Nones Treating Provider/Extender: Linwood Dibbles, Aryona Sill Weeks in Treatment: 11 Subjective Chief Complaint Information obtained from Patient Bilateral Gluteal Ulcers History of Present Illness (HPI) 09/14/17-he is here for evaluation of the left lower extremity injury. He is a resident of a group home and is accompanied by facility staff. He went to his primary care on 2/12 for a left lower extremity wound. At that appointment a wound culture was taken. A referral for wound care services was ordered on 2/26 and according to facility med rec he was started on Bactrim on 3/5. The patient and facility staff are very poor historians. It is assumed that the injury to the left leg was from scratching. He presents today with no open area, no erythema, no evidence of drainage. He is going to PCP later today for follow up. Readmission: 01/02/18 on evaluation today patient presents for reevaluation in the clinic although this is for a separate issue. He actually has several states to the pressure ulcers along his bilateral gluteal region which apparently have been present for several weeks now. He did see his primary care provider two weeks ago and they did initiate treatment. Home health was finally obtained although they were having a difficult time getting home health. It sounds like doing is actually used for the wound beds obviously don't think this is gonna be the best treatment option for him at this point. Fortunately there does  not appear to be any evidence of infection at this time the patient does have discomfort but not as much as he apparently had in the past. When he was originally dealing with these before going to see his primary care provider the pain was much more significant. He does have a foam cushion for his wheelchair although I think being that he is wheelchair dependent he may benefit from a Roho cushion at this point. He also may benefit from an air mattress in my pinion he does have a hospital bed but again between the bed and his wheelchair he pretty much spends all of his time during the day and one of these two locations. I do think he staying too long in the wheelchair as far as sitting up as well which also I think needs to be addressed I did have a long conversation with him today concerning the fact that he needs to rotate positions and locations at least every two hours. This is something we're going to put on the notes as well to sin with him to the facility. I do think that this is going to be of utmost importance the dressings can be helpful but they are not gonna be able to manage this completely alone. He does have some hyper granular tissue noted. 01/09/18 on evaluation today patient presents for reevaluation concerning multiple pressure ulcers noted of the bilateral gluteal region. He has been tolerating the dressing changes without complication the good news is he seems to be making great progress at this point in time. There does not appear to be any evidence of infection at this time. No fevers, chills, nausea, or vomiting noted at this time. 01/23/18 Patient's wounds seem to show signs of good improvement at this point in time. He has been tolerating the dressing changes without complication. With that being said I do believe that he has been making excellent progress over the past several weeks. No fevers, chills, nausea, or vomiting noted at this time. 02/06/18 on evaluation today  patient appears to be doing  much better in regard to his gluteal and Ischial ulcers. He's been tolerating the dressing changes without complication the Hydrofera Blue Dressing be doing excellent for him. Overall I'm very pleased in this regard. He's having no significant pain. 03/07/18 on evaluation today patient appears to be doing much better in regard to his ulcers in the sacral, gluteal, Ischial locations. Overall in fact he only has two wounds remaining that appear to be open. This is the right gluteus and right ischial locations. Other than this everything that I have been managing up to this point appears greatly improved. Overall the patient states he's having minimal pain. He does have a foam cushion for his wheelchair. [name], [name] ([id number]) 03/22/18 on evaluation today patient actually appears to be doing much better in regard to his gluteal/Ischial wounds. In fact he just has one area remaining at this point in time in the right gluteal region. Fortunately he has made excellent progress at this point. Patient History Information obtained from Patient. Family History Heart Disease - Siblings, Hypertension - Siblings, Lung Disease - Siblings, No family history of Cancer, Diabetes, Hereditary Spherocytosis, Kidney Disease, Seizures, Stroke, Thyroid Problems, Tuberculosis. Social History Current every day smoker, Marital Status - Single, Alcohol Use - Never - history of ETOH abuse, Drug Use - Prior History, Caffeine Use - Daily. Medical And Surgical History Notes Gastrointestinal Colostomy Musculoskeletal R AKA Review of Systems (ROS) Constitutional Symptoms (General Health) Denies complaints or symptoms of Fever, Chills. Respiratory The patient has no complaints or symptoms. Cardiovascular The patient has no complaints or symptoms. Psychiatric The patient has no complaints or symptoms. Objective Constitutional Well-nourished and well-hydrated in no acute  distress. Vitals Time Taken: 1:40 PM, Height: 70 in, Weight: 155 lbs, BMI: 22.2, Temperature: 98.6 F, Pulse: 81 bpm, Respiratory Rate: 16 breaths/min, Blood Pressure: 153/83 mmHg. Respiratory normal breathing without difficulty. clear to auscultation bilaterally. Cardiovascular regular rate and rhythm with normal S1, S2. Psychiatric this patient is able to make decisions and demonstrates good insight into disease process. Alert and Oriented x 3. pleasant [name], [name] ([id number]) and cooperative. General Notes: Patient's wound bed did have some Slough noted on the surface of the wound which was mechanically debrided away with saline and gauze she tolerated this without complication post debridement the wound bed appears to be doing significantly better. Overall it's healthy I do think the [facility] Dressing for free to continue. Integumentary (Hair, Skin) Wound #1 status is Healed - Epithelialized. Original cause of wound was Pressure Injury. The wound is located on the Right Gluteus. The wound measures 0cm length x 0cm width x 0cm depth; 0cm^2 area and 0cm^3 volume. There is Fat Layer (Subcutaneous Tissue) Exposed exposed. There is no tunneling or undermining noted. There is a medium amount of serous drainage noted. The wound margin is flat and intact. There is no granulation within the wound bed. There is no necrotic tissue within the wound bed. The periwound skin appearance exhibited: Erythema. The periwound skin appearance did not exhibit: Callus, Crepitus, Excoriation, Induration, Rash, Dry/Scaly, Maceration. The surrounding wound skin color is noted with erythema which is circumferential. Wound #3 status is Open. Original cause of wound was Pressure Injury. The wound is located on the Right Ischium. The wound measures 1.1cm length x 1.4cm width x 0.1cm depth; 1.21cm^2 area and 0.121cm^3 volume. There is no tunneling or undermining noted. There is a large amount of serous  drainage noted. The wound margin is flat and intact. There is no granulation within the wound  bed. There is a large (67-100%) amount of necrotic tissue within the wound bed including Adherent Slough. The periwound skin appearance had no abnormalities noted for color. The periwound skin appearance exhibited: Scarring. The periwound skin appearance did not exhibit: Callus, Crepitus, Excoriation, Induration, Rash, Dry/Scaly, Maceration. 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 Cleanse wound with mild soap and water May Shower, gently pat wound dry prior to applying new dressing. Anesthetic (add to Medication List): Wound #3 Right Ischium: Topical Lidocaine 4% cream applied to wound bed prior to debridement (In Clinic Only). Primary Wound Dressing: Wound #3 Right Ischium: [facility] Blue Ready Transfer [name], [name] ([id number]) Secondary Dressing: Wound #3 Right Ischium: Boardered Foam Dressing Dressing Change Frequency: Wound #3 Right Ischium: Change Dressing Monday, Wednesday, Friday - and as needed Follow-up Appointments: Wound #3 Right Ischium: Return Appointment in 2 weeks. Off-Loading: Wound #3 Right Ischium: Turn and reposition every 2 hours Additional Orders / Instructions: Wound #3 Right Ischium: Increase protein intake. Activity as tolerated Home Health: Wound #3 Right Ischium: Continue Home Health Visits - [facility] Nurse may visit PRN to address patient s wound care needs. FACE TO FACE ENCOUNTER: MEDICARE and MEDICAID PATIENTS: I certify that this patient is under my care and that I had a face-to-face encounter that meets the  physician face-to-face encounter requirements with this patient on this date. The encounter with the patient was in whole or in part for the following MEDICAL CONDITION: (primary reason for Home Healthcare) MEDICAL NECESSITY: I certify, that based on my findings, NURSING services are a medically necessary home health service. HOME BOUND STATUS: I certify that my clinical findings support that this patient is homebound (i.e., Due to illness or injury, pt requires aid of supportive devices such as crutches, cane, wheelchairs, walkers, the use of special transportation or the assistance of another person to leave their place of residence. There is a normal inability to leave the home and doing so requires considerable and taxing effort. Other absences are for medical reasons / religious services and are infrequent or of short duration when for other reasons). If current dressing causes regression in wound condition, may D/C ordered dressing product/s and apply Normal Saline Moist Dressing daily until next Wound Healing Center / Other MD appointment. Notify Wound Healing Center of regression in wound condition at [phone]. Please direct any NON-WOUND related issues/requests for orders to patient's Primary Care Physician Currently my suggestion is going to be that we continue with the above wound care measures for the next week he's in agreement that plan. We will see were things stand at follow-up and if he has any concerns meantime he'll let me know. Please see above for specific wound care orders. We will see patient for re-evaluation in 2 week(s) here in the clinic. If anything worsens or changes patient will contact our office for additional recommendations. Electronic Signature(s) Signed: 03/23/2018 8:54:03 AM By: Lenda Kelp PA-C Entered By: Lenda Kelp on 03/22/2018 14:44:46 [name]  ([id number]) -------------------------------------------------------------------------------- ROS/PFSH Details Patient Name: [name] Date of Service: 03/22/2018 1:30 PM Medical Record Number: [id number] Patient Account Number: [id number] Date of Birth/Sex: 20-Jan-1956 (62 y.o. M) Treating RN: Renne Crigler Primary Care Provider: Franco Nones Other Clinician: Referring Provider: Clint Guy  CHERYL Treating Provider/Extender: STONE III, Staci Dack Weeks in Treatment: 11 Information Obtained From Patient Wound History Do you currently have one or more open woundso Yes Approximately how long have you had your woundso 2 weeks How have you been treating your wound(s) until nowo Deuoderm Has your wound(s) ever healed and then re-openedo No Have you had any lab work done in the past montho No Have you tested positive for an antibiotic resistant organism (MRSA, VRE)o No Have you tested positive for osteomyelitis (bone infection)o No Have you had any tests for circulation on your legso No Constitutional Symptoms (General Health) Complaints and Symptoms: Negative for: Fever; Chills Eyes Medical History: Negative for: Cataracts; Glaucoma; Optic Neuritis Ear/Nose/Mouth/Throat Medical History: Positive for: Chronic sinus problems/congestion Negative for: Middle ear problems Hematologic/Lymphatic Medical History: Positive for: Anemia Negative for: Hemophilia; Human Immunodeficiency Virus; Lymphedema; Sickle Cell Disease Respiratory Complaints and Symptoms: No Complaints or Symptoms Medical History: Negative for: Aspiration; Asthma; Chronic Obstructive Pulmonary Disease (COPD); Pneumothorax; Sleep Apnea; Tuberculosis Cardiovascular Complaints and Symptoms: No Complaints or Symptoms Medical History: Positive for: Arrhythmia Tripodi, Nicanor ([id number]) Negative for: Angina; Congestive Heart Failure; Coronary Artery Disease; Deep Vein Thrombosis; Hypertension; Hypotension; Myocardial  Infarction; Peripheral Arterial Disease; Peripheral Venous Disease; Phlebitis; Vasculitis Gastrointestinal Medical History: Positive for: Hepatitis C Negative for: Cirrhosis ; Colitis; Crohnos; Hepatitis A; Hepatitis B Past Medical History Notes: Colostomy Endocrine Medical History: Positive for: Type II Diabetes Time with diabetes: 1 year Treated with: Insulin Blood sugar tested every day: Yes Tested : Genitourinary Medical History: Negative for: End Stage Renal Disease Immunological Medical History: Negative for: Lupus Erythematosus; Raynaudos; Scleroderma Musculoskeletal Medical History: Past Medical History Notes: R AKA Neurologic Medical History: Negative for: Dementia; Neuropathy; Quadriplegia; Paraplegia; Seizure Disorder Psychiatric Complaints and Symptoms: No Complaints or Symptoms HBO Extended History Items Ear/Nose/Mouth/Throat: Chronic sinus problems/congestion Immunizations Pneumococcal Vaccine: Received Pneumococcal Vaccination: Yes Immunization Notes: up to date Implantable Devices Family and Social History [name], [name] ([id number]) Cancer: No; Diabetes: No; Heart Disease: Yes - Siblings; Hereditary Spherocytosis: No; Hypertension: Yes - Siblings; Kidney Disease: No; Lung Disease: Yes - Siblings; Seizures: No; Stroke: No; Thyroid Problems: No; Tuberculosis: No; Current every day smoker; Marital Status - Single; Alcohol Use: Never - history of ETOH abuse; Drug Use: Prior History; Caffeine Use: Daily; Financial Concerns: No; Food, Clothing or Shelter Needs: No; Support System Lacking: No; Transportation Concerns: No; Advanced Directives: No; Patient does not want information on Advanced Directives Physician Affirmation I have reviewed and agree with the above information. Electronic Signature(s) Signed: 03/23/2018 8:12:43 AM By: Renne Crigler Signed: 03/23/2018 8:54:03 AM By: Lenda Kelp PA-C Entered By: Lenda Kelp on 03/22/2018  14:44:04 [name] ([id number]) -------------------------------------------------------------------------------- SuperBill Details Patient Name: [name] Date of Service: 03/22/2018 Medical Record Number: [id number] Patient Account Number: [id number] Date of Birth/Sex: 1956/07/06 (62 y.o. 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

## 2018-03-27 NOTE — Progress Notes (Signed)
Shawn, Higgins ([id number]) Visit Report for 03/22/2018 Arrival Information Details Patient Name: [name], [name] Date of Service: 03/22/2018 1:30 PM Medical Record Number: [id number] Patient Account Number: [id number] Date of Birth/Sex: [birth year] (62 y.o. M) Treating RN: Rema Jasmine Primary Care Kaiulani Sitton: Franco Nones Other Clinician: Referring Chevon Laufer: Franco Nones Treating Noach Calvillo/Extender: Linwood Dibbles, HOYT Weeks in Treatment: 11 Visit Information History Since Last Visit Added or deleted any medications: No Patient Arrived: Wheel Chair Any new allergies or adverse reactions: No Arrival Time: 13:41 Had a fall or experienced change in No activities of daily living that may affect Accompanied By: driver risk of falls: Transfer Assistance: None Signs or symptoms of abuse/neglect since last visito No Patient Identification Verified: Yes Hospitalized since last visit: No Secondary Verification Process Completed: Yes Implantable device outside of the clinic excluding No Patient Requires Transmission-Based No cellular tissue based products placed in the center Precautions: since last visit: Patient Has Alerts: No Pain Present Now: No Electronic Signature(s) Signed: 03/22/2018 4:01:12 PM By: Rema Jasmine Entered By: Rema Jasmine on 03/22/2018 13:42:33 [name] ([id number]) -------------------------------------------------------------------------------- Clinic Level of Care Assessment Details Patient Name: [name] Date of Service: 03/22/2018 1:30 PM Medical Record Number: [id number] Patient Account Number: [id number] Date of Birth/Sex: Apr 03, 1956 (62 y.o. M) Treating RN: Renne Crigler Primary Care Thierry Dobosz: Franco Nones Other Clinician: Referring Alanea Woolridge: Franco Nones Treating Manami Tutor/Extender: STONE III, HOYT Weeks in Treatment: 11 Clinic Level of Care Assessment Items TOOL 4 Quantity Score X - Use when only an EandM is performed on FOLLOW-UP visit 1  0 ASSESSMENTS - Nursing Assessment / Reassessment []  - Reassessment of Co-morbidities (includes updates in patient status) 0 X- 1 5 Reassessment of Adherence to Treatment Plan ASSESSMENTS - Wound and Skin Assessment / Reassessment X - Simple Wound Assessment / Reassessment - one wound 1 5 []  - 0 Complex Wound Assessment / Reassessment - multiple wounds []  - 0 Dermatologic / Skin Assessment (not related to wound area) ASSESSMENTS - Focused Assessment []  - Circumferential Edema Measurements - multi extremities 0 []  - 0 Nutritional Assessment / Counseling / Intervention []  - 0 Lower Extremity Assessment (monofilament, tuning fork, pulses) []  - 0 Peripheral Arterial Disease Assessment (using hand held doppler) ASSESSMENTS - Ostomy and/or Continence Assessment and Care []  - Incontinence Assessment and Management 0 []  - 0 Ostomy Care Assessment and Management (repouching, etc.) PROCESS - Coordination of Care X - Simple Patient / Family Education for ongoing care 1 15 []  - 0 Complex (extensive) Patient / Family Education for ongoing care []  - 0 Staff obtains Chiropractor, Records, Test Results / Process Orders []  - 0 Staff telephones HHA, Nursing Homes / Clarify orders / etc []  - 0 Routine Transfer to another Facility (non-emergent condition) []  - 0 Routine Hospital Admission (non-emergent condition) []  - 0 New Admissions / Manufacturing engineer / Ordering NPWT, Apligraf, etc. []  - 0 Emergency Hospital Admission (emergent condition) X- 1 10 Simple Discharge Coordination Revels, Yovani ([id number]) []  - 0 Complex (extensive) Discharge Coordination PROCESS - Special Needs []  - Pediatric / Minor Patient Management 0 []  - 0 Isolation Patient Management []  - 0 Hearing / Language / Visual special needs []  - 0 Assessment of Community assistance (transportation, D/C planning, etc.) []  - 0 Additional assistance / Altered mentation []  - 0 Support Surface(s) Assessment (bed,  cushion, seat, etc.) INTERVENTIONS - Wound Cleansing / Measurement X - Simple Wound Cleansing - one wound 1 5 []  - 0 Complex Wound Cleansing - multiple wounds X- 1 5 Wound Imaging (photographs -  any number of wounds) []  - 0 Wound Tracing (instead of photographs) X- 1 5 Simple Wound Measurement - one wound []  - 0 Complex Wound Measurement - multiple wounds INTERVENTIONS - Wound Dressings X - Small Wound Dressing one or multiple wounds 1 10 []  - 0 Medium Wound Dressing one or multiple wounds []  - 0 Large Wound Dressing one or multiple wounds []  - 0 Application of Medications - topical []  - 0 Application of Medications - injection INTERVENTIONS - Miscellaneous []  - External ear exam 0 []  - 0 Specimen Collection (cultures, biopsies, blood, body fluids, etc.) []  - 0 Specimen(s) / Culture(s) sent or taken to Lab for analysis []  - 0 Patient Transfer (multiple staff / Nurse, adult / Similar devices) []  - 0 Simple Staple / Suture removal (25 or less) []  - 0 Complex Staple / Suture removal (26 or more) []  - 0 Hypo / Hyperglycemic Management (close monitor of Blood Glucose) []  - 0 Ankle / Brachial Index (ABI) - do not check if billed separately X- 1 5 Vital Signs Ebright, Makai ([id number]) Has the patient been seen at the hospital within the last three years: Yes Total Score: 65 Level Of Care: New/Established - Level 2 Electronic Signature(s) Signed: 03/23/2018 8:12:43 AM By: Renne Crigler Entered By: Renne Crigler on 03/22/2018 14:35:12 [name] ([id number]) -------------------------------------------------------------------------------- Encounter Discharge Information Details Patient Name: [name] Date of Service: 03/22/2018 1:30 PM Medical Record Number: [id number] Patient Account Number: [id number] Date of Birth/Sex: 1956-06-10 (62 y.o. M) Treating RN: Curtis Sites Primary Care Teea Ducey: Franco Nones Other Clinician: Referring Deannie Resetar: Franco Nones Treating Cavin Longman/Extender: Linwood Dibbles, HOYT Weeks in Treatment: 11 Encounter Discharge Information Items Discharge Condition: Stable Ambulatory Status: Wheelchair Discharge Destination: Home Transportation: Private Auto Accompanied By: caregiver Schedule Follow-up Appointment: Yes Clinical Summary of Care: Electronic Signature(s) Signed: 03/22/2018 5:06:40 PM By: Curtis Sites Entered By: Curtis Sites on 03/22/2018 15:01:55 [name] ([id number]) -------------------------------------------------------------------------------- Lower Extremity Assessment Details Patient Name: [name] Date of Service: 03/22/2018 1:30 PM Medical Record Number: [id number] Patient Account Number: [id number] Date of Birth/Sex: 12-15-1955 (62 y.o. M) Treating RN: Rema Jasmine Primary Care Brea Coleson: Franco Nones Other Clinician: Referring Hardie Veltre: Franco Nones Treating Jinan Biggins/Extender: Linwood Dibbles, HOYT Weeks in Treatment: 11 Electronic Signature(s) Signed: 03/22/2018 4:01:12 PM By: Rema Jasmine Entered By: Rema Jasmine on 03/22/2018 13:43:18 [name] ([id number]) -------------------------------------------------------------------------------- Multi Wound Chart Details Patient Name: [name] Date of Service: 03/22/2018 1:30 PM Medical Record Number: [id number] Patient Account Number: [id number] Date of Birth/Sex: Mar 15, 1956 (62 y.o. M) Treating RN: Renne Crigler Primary Care Stone Spirito: Franco Nones Other Clinician: Referring Elaiza Shoberg: Franco Nones Treating Tracyann Duffell/Extender: STONE III, HOYT Weeks in Treatment: 11 Vital Signs Height(in): 70 Pulse(bpm): 81 Weight(lbs): 155 Blood Pressure(mmHg): 153/83 Body Mass Index(BMI): 22 Temperature(F): 98.6 Respiratory Rate 16 (breaths/min): Photos: [N/A:N/A] Wound Location: Right Gluteus Right Ischium N/A Wounding Event: Pressure Injury Pressure Injury N/A Primary Etiology: Pressure Ulcer Pressure Ulcer  N/A Comorbid History: Chronic sinus Chronic sinus N/A problems/congestion, Anemia, problems/congestion, Anemia, Arrhythmia, Hepatitis C, Type Arrhythmia, Hepatitis C, Type II Diabetes II Diabetes Date Acquired: 12/11/2017 12/15/2017 N/A Weeks of Treatment: 11 11 N/A Wound Status: Open Open N/A Measurements L x W x D 0.1x0.1x0.1 1.1x1.4x0.1 N/A (cm) Area (cm) : 0.008 1.21 N/A Volume (cm) : 0.001 0.121 N/A % Reduction in Area: 99.80% 84.00% N/A % Reduction in Volume: 99.70% 84.00% N/A Classification: Category/Stage III Category/Stage III N/A Exudate Amount: Medium Large N/A Exudate Type: Serous Serous N/A Exudate Color: amber amber N/A Wound Margin: Flat and  Intact Flat and Intact N/A Granulation Amount: None Present (0%) None Present (0%) N/A Necrotic Amount: None Present (0%) Large (67-100%) N/A Exposed Structures: Fat Layer (Subcutaneous Fascia: No N/A Tissue) Exposed: Yes Fat Layer (Subcutaneous Fascia: No Tissue) Exposed: No Tendon: No Tendon: No Muscle: No Muscle: No Hagey, Kamil ([id number]) Joint: No Joint: No Bone: No Bone: No Epithelialization: None None N/A Periwound Skin Texture: Excoriation: No Scarring: Yes N/A Induration: No Excoriation: No Callus: No Induration: No Crepitus: No Callus: No Rash: No Crepitus: No Rash: No Periwound Skin Moisture: Maceration: No Maceration: No N/A Dry/Scaly: No Dry/Scaly: No Periwound Skin Color: Erythema: Yes No Abnormalities Noted N/A Erythema Location: Circumferential N/A N/A Tenderness on Palpation: No No N/A Wound Preparation: Ulcer Cleansing: Ulcer Cleansing: N/A Rinsed/Irrigated with Saline Rinsed/Irrigated with Saline Topical Anesthetic Applied: Topical Anesthetic Applied: Other: lidocaine 4% Other: lidocaine 4% Treatment Notes Electronic Signature(s) Signed: 03/23/2018 8:12:43 AM By: Renne Crigler Entered By: Renne Crigler on 03/22/2018 14:32:55 [name]  ([id number]) -------------------------------------------------------------------------------- Multi-Disciplinary Care Plan Details Patient Name: [name] Date of Service: 03/22/2018 1:30 PM Medical Record Number: [id number] Patient Account Number: [id number] Date of Birth/Sex: Oct 29, 1955 (62 y.o. M) Treating RN: Renne Crigler Primary Care Dore Oquin: Franco Nones Other Clinician: Referring Lizzette Carbonell: Franco Nones Treating Osiah Haring/Extender: STONE III, HOYT Weeks in Treatment: 11 Active Inactive ` Abuse / Safety / Falls / Self Care Management Nursing Diagnoses: Impaired physical mobility Goals: Patient will not develop complications from immobility Date Initiated: 01/02/2018 Target Resolution Date: 02/16/2018 Goal Status: Active Interventions: Assess fall risk on admission and as needed Notes: ` Nutrition Nursing Diagnoses: Potential for alteratiion in Nutrition/Potential for imbalanced nutrition Goals: Patient/caregiver agrees to and verbalizes understanding of need to use nutritional supplements and/or vitamins as prescribed Date Initiated: 01/02/2018 Target Resolution Date: 03/17/2018 Goal Status: Active Interventions: Provide education on nutrition Notes: ` Orientation to the Wound Care Program Nursing Diagnoses: Knowledge deficit related to the wound healing center program Goals: Patient/caregiver will verbalize understanding of the Wound Healing Center Program Date Initiated: 01/02/2018 Target Resolution Date: 02/16/2018 Goal Status: Active Interventions: MASAMI, PLATA ([id number]) Provide education on orientation to the wound center Notes: ` Pressure Nursing Diagnoses: Potential for impaired tissue integrity related to pressure, friction, moisture, and shear Goals: Patient will remain free of pressure ulcers Date Initiated: 01/02/2018 Target Resolution Date: 03/17/2018 Goal Status: Active Interventions: Assess potential for pressure ulcer upon admission  and as needed Notes: ` Wound/Skin Impairment Nursing Diagnoses: Impaired tissue integrity Goals: Ulcer/skin breakdown will heal within 14 weeks Date Initiated: 01/02/2018 Target Resolution Date: 03/16/2018 Goal Status: Active Interventions: Assess patient/caregiver ability to obtain necessary supplies Assess patient/caregiver ability to perform ulcer/skin care regimen upon admission and as needed Assess ulceration(s) every visit Notes: Electronic Signature(s) Signed: 03/23/2018 8:12:43 AM By: Renne Crigler Entered By: Renne Crigler on 03/22/2018 14:32:48 [name] ([id number]) -------------------------------------------------------------------------------- Pain Assessment Details Patient Name: [name] Date of Service: 03/22/2018 1:30 PM Medical Record Number: [id number] Patient Account Number: [id number] Date of Birth/Sex: May 28, 1956 (62 y.o. M) Treating RN: Rema Jasmine Primary Care Marjan Rosman: Franco Nones Other Clinician: Referring Jaren Kearn: Franco Nones Treating Izik Bingman/Extender: STONE III, HOYT Weeks in Treatment: 11 Active Problems Location of Pain Severity and Description of Pain Patient Has Paino No Site Locations Pain Management and Medication Current Pain Management: Goals for Pain Management pt denies any pain at this time. Electronic Signature(s) Signed: 03/22/2018 4:01:12 PM By: Rema Jasmine Entered By: Rema Jasmine on 03/22/2018 13:42:56 [name] ([id number]) -------------------------------------------------------------------------------- Patient/Caregiver Education Details Patient Name: [name] Date of Service:  03/22/2018 1:30 PM Medical Record Number: [id number] Patient Account Number: [id number] Date of Birth/Gender: 02-17-1956 (62 y.o. M) Treating RN: Curtis Sitesorthy, Joanna Primary Care Physician: Franco NonesLINDLEY, CHERYL Other Clinician: Referring Physician: Franco NonesLINDLEY, CHERYL Treating Physician/Extender: Skeet SimmerSTONE III, HOYT Weeks in Treatment:  11 Education Assessment Education Provided To: Patient and Caregiver Education Topics Provided Wound/Skin Impairment: Handouts: Other: wound care to continue as ordered Methods: Demonstration, Explain/Verbal Responses: State content correctly Electronic Signature(s) Signed: 03/22/2018 5:06:40 PM By: Curtis Sitesorthy, Joanna Entered By: Curtis Sitesorthy, Joanna on 03/22/2018 15:02:17 Shawn SchlichterANDLES, Wetzel ([id number]) -------------------------------------------------------------------------------- Wound Assessment Details Patient Name: Shawn SchlichterANDLES, Mcarthur Date of Service: 03/22/2018 1:30 PM Medical Record Number: [id number] Patient Account Number: [id number] Date of Birth/Sex: 02-17-1956 (62 y.o. M) Treating RN: Renne CriglerFlinchum, Cheryl Primary Care Atley Neubert: Franco NonesLINDLEY, CHERYL Other Clinician: Referring Kaleb Linquist: Franco NonesLINDLEY, CHERYL Treating Annalucia Laino/Extender: STONE III, HOYT Weeks in Treatment: 11 Wound Status Wound Number: 1 Primary Pressure Ulcer Etiology: Wound Location: Right Gluteus Wound Healed - Epithelialized Wounding Event: Pressure Injury Status: Date Acquired: 12/11/2017 Comorbid Chronic sinus problems/congestion, Anemia, Weeks Of Treatment: 11 History: Arrhythmia, Hepatitis C, Type II Diabetes Clustered Wound: No Photos Photo Uploaded By: Rema JasmineNg, Wendi on 03/22/2018 14:01:40 Wound Measurements Length: (cm) 0 % Reduc Width: (cm) 0 % Reduc Depth: (cm) 0 Epithel Area: (cm) 0 Tunnel Volume: (cm) 0 Underm tion in Area: 100% tion in Volume: 100% ialization: None ing: No ining: No Wound Description Classification: Category/Stage III Wound Margin: Flat and Intact Exudate Amount: Medium Exudate Type: Serous Exudate Color: amber Foul Odor After Cleansing: No Slough/Fibrino No Wound Bed Granulation Amount: None Present (0%) Exposed Structure Necrotic Amount: None Present (0%) Fascia Exposed: No Fat Layer (Subcutaneous Tissue) Exposed: Yes Tendon Exposed: No Muscle Exposed: No Joint Exposed: No Bone  Exposed: No Periwound Skin Texture Harrison, Pepper ([id number]) Texture Color No Abnormalities Noted: No No Abnormalities Noted: No Callus: No Erythema: Yes Crepitus: No Erythema Location: Circumferential Excoriation: No Induration: No Rash: No Moisture No Abnormalities Noted: No Dry / Scaly: No Maceration: No Wound Preparation Ulcer Cleansing: Rinsed/Irrigated with Saline Topical Anesthetic Applied: Other: lidocaine 4%, Electronic Signature(s) Signed: 03/23/2018 8:12:43 AM By: Renne CriglerFlinchum, Cheryl Entered By: Renne CriglerFlinchum, Cheryl on 03/22/2018 14:33:30 Shawn SchlichterANDLES, Yannick ([id number]) -------------------------------------------------------------------------------- Wound Assessment Details Patient Name: Shawn SchlichterANDLES, Arish Date of Service: 03/22/2018 1:30 PM Medical Record Number: [id number] Patient Account Number: [id number] Date of Birth/Sex: 02-17-1956 (62 y.o. M) Treating RN: Rema JasmineNg, Wendi Primary Care Tomeko Scoville: Franco NonesLINDLEY, CHERYL Other Clinician: Referring Loghan Kurtzman: Franco NonesLINDLEY, CHERYL Treating Bhavya Grand/Extender: STONE III, HOYT Weeks in Treatment: 11 Wound Status Wound Number: 3 Primary Pressure Ulcer Etiology: Wound Location: Right Ischium Wound Open Wounding Event: Pressure Injury Status: Date Acquired: 12/15/2017 Comorbid Chronic sinus problems/congestion, Anemia, Weeks Of Treatment: 11 History: Arrhythmia, Hepatitis C, Type II Diabetes Clustered Wound: No Photos Photo Uploaded By: Rema JasmineNg, Wendi on 03/22/2018 14:01:41 Wound Measurements Length: (cm) 1.1 % Reduction Width: (cm) 1.4 % Reduction Depth: (cm) 0.1 Epitheliali Area: (cm) 1.21 Tunneling: Volume: (cm) 0.121 Underminin in Area: 84% in Volume: 84% zation: None No g: No Wound Description Classification: Category/Stage III Foul Odor Wound Margin: Flat and Intact Slough/Fib Exudate Amount: Large Exudate Type: Serous Exudate Color: amber After Cleansing: No rino No Wound Bed Granulation Amount: None Present (0%) Exposed  Structure Necrotic Amount: Large (67-100%) Fascia Exposed: No Necrotic Quality: Adherent Slough Fat Layer (Subcutaneous Tissue) Exposed: No Tendon Exposed: No Muscle Exposed: No Joint Exposed: No Bone Exposed: No Periwound Skin Texture Grandt, Treyshawn ([id number]) Texture Color No Abnormalities Noted: No No Abnormalities Noted: Yes Callus: No Crepitus: No Excoriation: No Induration: No Rash:  No Scarring: Yes Moisture No Abnormalities Noted: No Dry / Scaly: No Maceration: No Wound Preparation Ulcer Cleansing: Rinsed/Irrigated with Saline Topical Anesthetic Applied: Other: lidocaine 4%, Treatment Notes Wound #3 (Right Ischium) 1. Cleansed with: Clean wound with Normal Saline 2. Anesthetic Topical Lidocaine 4% cream to wound bed prior to debridement 3. Peri-wound Care: Skin Prep 4. Dressing Applied: Hydrafera Blue 5. Secondary Dressing Applied Bordered Foam Dressing Electronic Signature(s) Signed: 03/22/2018 4:01:12 PM By: Rema Jasmine Entered By: Rema Jasmine on 03/22/2018 13:59:10 [name] ([id number]) -------------------------------------------------------------------------------- Vitals Details Patient Name: [name] Date of Service: 03/22/2018 1:30 PM Medical Record Number: [id number] Patient Account Number: [id number] Date of Birth/Sex: [birth year] (62 y.o. M) Treating RN: Rema Jasmine Primary Care Valentin Benney: Franco Nones Other Clinician: Referring Gussie Towson: Franco Nones Treating Calleen Alvis/Extender: STONE III, HOYT Weeks in Treatment: 11 Vital Signs Time Taken: 13:40 Temperature (F): 98.6 Height (in): 70 Pulse (bpm): 81 Weight (lbs): 155 Respiratory Rate (breaths/min): 16 Body Mass Index (BMI): 22.2 Blood Pressure (mmHg): 153/83 Reference Range: 80 - 120 mg / dl Electronic Signature(s) Signed: 03/22/2018 4:01:12 PM By: Rema Jasmine Entered By: Rema Jasmine on 03/22/2018 13:45:34

## 2018-04-10 ENCOUNTER — Encounter: Payer: Medicaid Other | Attending: Physician Assistant | Admitting: Physician Assistant

## 2018-04-10 DIAGNOSIS — K7030 Alcoholic cirrhosis of liver without ascites: Secondary | ICD-10-CM | POA: Insufficient documentation

## 2018-04-10 DIAGNOSIS — L89313 Pressure ulcer of right buttock, stage 3: Secondary | ICD-10-CM | POA: Insufficient documentation

## 2018-04-10 DIAGNOSIS — E11622 Type 2 diabetes mellitus with other skin ulcer: Secondary | ICD-10-CM | POA: Diagnosis not present

## 2018-04-10 DIAGNOSIS — F17210 Nicotine dependence, cigarettes, uncomplicated: Secondary | ICD-10-CM | POA: Insufficient documentation

## 2018-04-10 DIAGNOSIS — B1920 Unspecified viral hepatitis C without hepatic coma: Secondary | ICD-10-CM | POA: Diagnosis not present

## 2018-04-10 DIAGNOSIS — Z8249 Family history of ischemic heart disease and other diseases of the circulatory system: Secondary | ICD-10-CM | POA: Insufficient documentation

## 2018-04-10 DIAGNOSIS — L89323 Pressure ulcer of left buttock, stage 3: Secondary | ICD-10-CM | POA: Diagnosis not present

## 2018-04-10 DIAGNOSIS — D649 Anemia, unspecified: Secondary | ICD-10-CM | POA: Diagnosis not present

## 2018-04-10 DIAGNOSIS — Z993 Dependence on wheelchair: Secondary | ICD-10-CM | POA: Insufficient documentation

## 2018-04-14 NOTE — Progress Notes (Signed)
KIRTAN, SADA ([id number]) Visit Report for 04/10/2018 Arrival Information Details Patient Name: [name], [name] Date of Service: 04/10/2018 2:45 PM Medical Record Number: [id number] Patient Account Number: [id number] Date of Birth/Sex: [birth year] (62 y.o. M) Treating RN: Curtis Sites Primary Care Trinika Cortese: Franco Nones Other Clinician: Referring Denita Lun: Franco Nones Treating Kaveh Kissinger/Extender: Linwood Dibbles, HOYT Weeks in Treatment: 14 Visit Information History Since Last Visit Added or deleted any medications: Yes Patient Arrived: Wheel Chair Any new allergies or adverse reactions: No Arrival Time: 14:48 Had a fall or experienced change in No activities of daily living that may affect Accompanied By: caregiver risk of falls: Transfer Assistance: Manual Signs or symptoms of abuse/neglect since last visito No Patient Identification Verified: Yes Hospitalized since last visit: No Secondary Verification Process Completed: Yes Implantable device outside of the clinic excluding No Patient Requires Transmission-Based No cellular tissue based products placed in the center Precautions: since last visit: Patient Has Alerts: No Has Dressing in Place as Prescribed: Yes Pain Present Now: Yes Electronic Signature(s) Signed: 04/10/2018 3:15:29 PM By: Dayton Martes RCP, RRT, CHT Entered By: Dayton Martes on 04/10/2018 14:49:26 [name] ([id number]) -------------------------------------------------------------------------------- Clinic Level of Care Assessment Details Patient Name: [name] Date of Service: 04/10/2018 2:45 PM Medical Record Number: [id number] Patient Account Number: [id number] Date of Birth/Sex: 11-14-1955 (62 y.o. M) Treating RN: Curtis Sites Primary Care Rayaan Lorah: Franco Nones Other Clinician: Referring Malasia Torain: Franco Nones Treating Yer Castello/Extender: Linwood Dibbles, HOYT Weeks in Treatment: 14 Clinic Level of Care  Assessment Items TOOL 4 Quantity Score []  - Use when only an EandM is performed on FOLLOW-UP visit 0 ASSESSMENTS - Nursing Assessment / Reassessment X - Reassessment of Co-morbidities (includes updates in patient status) 1 10 X- 1 5 Reassessment of Adherence to Treatment Plan ASSESSMENTS - Wound and Skin Assessment / Reassessment []  - Simple Wound Assessment / Reassessment - one wound 0 X- 3 5 Complex Wound Assessment / Reassessment - multiple wounds []  - 0 Dermatologic / Skin Assessment (not related to wound area) ASSESSMENTS - Focused Assessment []  - Circumferential Edema Measurements - multi extremities 0 []  - 0 Nutritional Assessment / Counseling / Intervention []  - 0 Lower Extremity Assessment (monofilament, tuning fork, pulses) []  - 0 Peripheral Arterial Disease Assessment (using hand held doppler) ASSESSMENTS - Ostomy and/or Continence Assessment and Care []  - Incontinence Assessment and Management 0 []  - 0 Ostomy Care Assessment and Management (repouching, etc.) PROCESS - Coordination of Care X - Simple Patient / Family Education for ongoing care 1 15 []  - 0 Complex (extensive) Patient / Family Education for ongoing care []  - 0 Staff obtains Chiropractor, Records, Test Results / Process Orders []  - 0 Staff telephones HHA, Nursing Homes / Clarify orders / etc []  - 0 Routine Transfer to another Facility (non-emergent condition) []  - 0 Routine Hospital Admission (non-emergent condition) []  - 0 New Admissions / Manufacturing engineer / Ordering NPWT, Apligraf, etc. []  - 0 Emergency Hospital Admission (emergent condition) X- 1 10 Simple Discharge Coordination [name], [name] ([id number]) []  - 0 Complex (extensive) Discharge Coordination PROCESS - Special Needs []  - Pediatric / Minor Patient Management 0 []  - 0 Isolation Patient Management []  - 0 Hearing / Language / Visual special needs []  - 0 Assessment of Community assistance (transportation, D/C planning,  etc.) []  - 0 Additional assistance / Altered mentation []  - 0 Support Surface(s) Assessment (bed, cushion, seat, etc.) INTERVENTIONS - Wound Cleansing / Measurement []  - Simple Wound Cleansing - one wound 0 X- 3 5 Complex Wound Cleansing -  multiple wounds X- 1 5 Wound Imaging (photographs - any number of wounds) []  - 0 Wound Tracing (instead of photographs) []  - 0 Simple Wound Measurement - one wound X- 3 5 Complex Wound Measurement - multiple wounds INTERVENTIONS - Wound Dressings X - Small Wound Dressing one or multiple wounds 3 10 []  - 0 Medium Wound Dressing one or multiple wounds []  - 0 Large Wound Dressing one or multiple wounds []  - 0 Application of Medications - topical []  - 0 Application of Medications - injection INTERVENTIONS - Miscellaneous []  - External ear exam 0 []  - 0 Specimen Collection (cultures, biopsies, blood, body fluids, etc.) []  - 0 Specimen(s) / Culture(s) sent or taken to Lab for analysis []  - 0 Patient Transfer (multiple staff / Nurse, adult / Similar devices) []  - 0 Simple Staple / Suture removal (25 or less) []  - 0 Complex Staple / Suture removal (26 or more) []  - 0 Hypo / Hyperglycemic Management (close monitor of Blood Glucose) []  - 0 Ankle / Brachial Index (ABI) - do not check if billed separately X- 1 5 Vital Signs [name], [name] ([id number]) Has the patient been seen at the hospital within the last three years: Yes Total Score: 125 Level Of Care: New/Established - Level 4 Electronic Signature(s) Signed: 04/10/2018 5:00:45 PM By: Curtis Sites Entered By: Curtis Sites on 04/10/2018 15:47:37 [name] ([id number]) -------------------------------------------------------------------------------- Encounter Discharge Information Details Patient Name: [name] Date of Service: 04/10/2018 2:45 PM Medical Record Number: [id number] Patient Account Number: [id number] Date of Birth/Sex: 1955-11-13 (62 y.o. M) Treating RN:  Renne Crigler Primary Care Leary Mcnulty: Franco Nones Other Clinician: Referring Katiana Ruland: Franco Nones Treating Dorris Pierre/Extender: Linwood Dibbles, HOYT Weeks in Treatment: 14 Encounter Discharge Information Items Discharge Condition: Stable Ambulatory Status: Ambulatory Discharge Destination: Home Transportation: Private Auto Accompanied By: caregiver Schedule Follow-up Appointment: Yes Clinical Summary of Care: Electronic Signature(s) Signed: 04/10/2018 4:16:12 PM By: Renne Crigler Entered By: Renne Crigler on 04/10/2018 15:58:23 [name] ([id number]) -------------------------------------------------------------------------------- Lower Extremity Assessment Details Patient Name: [name] Date of Service: 04/10/2018 2:45 PM Medical Record Number: [id number] Patient Account Number: [id number] Date of Birth/Sex: March 18, 1956 (62 y.o. M) Treating RN: Huel Coventry Primary Care Zikeria Keough: Franco Nones Other Clinician: Referring Genese Quebedeaux: Franco Nones Treating My Rinke/Extender: Skeet Simmer in Treatment: 14 Electronic Signature(s) Signed: 04/10/2018 4:33:57 PM By: Elliot Gurney, BSN, RN, CWS, Kim RN, BSN Entered By: Elliot Gurney, BSN, RN, CWS, Kim on 04/10/2018 14:59:23 [name] ([id number]) -------------------------------------------------------------------------------- Multi Wound Chart Details Patient Name: [name] Date of Service: 04/10/2018 2:45 PM Medical Record Number: [id number] Patient Account Number: [id number] Date of Birth/Sex: Jul 12, 1955 (62 y.o. M) Treating RN: Curtis Sites Primary Care Basir Niven: Franco Nones Other Clinician: Referring Luberta Grabinski: Franco Nones Treating Wrenna Saks/Extender: STONE III, HOYT Weeks in Treatment: 14 Vital Signs Height(in): 70 Pulse(bpm): 79 Weight(lbs): 155 Blood Pressure(mmHg): 114/82 Body Mass Index(BMI): 22 Temperature(F): 98.3 Respiratory Rate 16 (breaths/min): Photos: [1R:No Photos] [3:No  Photos] [6:No Photos] Wound Location: [1R:Right Gluteus] [3:Right Ischium] [6:Right Ischium - Distal] Wounding Event: [1R:Pressure Injury] [3:Pressure Injury] [6:Shear/Friction] Primary Etiology: [1R:Pressure Ulcer] [3:Pressure Ulcer] [6:Pressure Ulcer] Comorbid History: [1R:Chronic sinus problems/congestion, Anemia, Arrhythmia, Hepatitis C, Type II Diabetes] [3:Chronic sinus problems/congestion, Anemia, Arrhythmia, Hepatitis C, Type II Diabetes] [6:Chronic sinus problems/congestion, Anemia, Arrhythmia,  Hepatitis C, Type II Diabetes] Date Acquired: [1R:12/11/2017] [3:12/15/2017] [6:04/06/2018] Weeks of Treatment: [1R:14] [3:14] [6:0] Wound Status: [1R:Open] [3:Open] [6:Open] Wound Recurrence: [1R:Yes] [3:No] [6:No] Measurements L x W x D [1R:1.2x1.2x0.1] [3:2.5x2.5x0.1] [6:1x1.2x0.1] (cm) Area (cm) : [1R:1.131] [3:4.909] [6:0.942] Volume (cm) : [1R:0.113] [  3:0.491] [6:0.094] % Reduction in Area: [1R:65.70%] [3:34.90%] [6:0.00%] % Reduction in Volume: [1R:65.80%] [3:34.90%] [6:0.00%] Classification: [1R:Category/Stage III] [3:Category/Stage III] [6:Category/Stage II] Exudate Amount: [1R:Medium] [3:Large] [6:Medium] Exudate Type: [1R:Serous] [3:Serous] [6:Serosanguineous] Exudate Color: [1R:amber] [3:amber] [6:red, brown] Wound Margin: [1R:Flat and Intact] [3:Flat and Intact] [6:Flat and Intact] Granulation Amount: [1R:Large (67-100%)] [3:Large (67-100%)] [6:Large (67-100%)] Granulation Quality: [1R:Red] [3:Red] [6:Red] Necrotic Amount: [1R:Small (1-33%)] [3:Small (1-33%)] [6:Small (1-33%)] Exposed Structures: [1R:Fat Layer (Subcutaneous Tissue) Exposed: Yes Fascia: No Tendon: No Muscle: No Joint: No Bone: No] [3:Fat Layer (Subcutaneous Tissue) Exposed: Yes Fascia: No Tendon: No Muscle: No Joint: No Bone: No] [6:Fat Layer (Subcutaneous Tissue) Exposed: Yes] Epithelialization: [1R:None] [3:None] [6:None] Periwound Skin Texture: [1R:Excoriation: No Induration: No Callus: No] [3:Excoriation:  No Induration: No Callus: No] [6:Excoriation: No Induration: No Callus: No] Crepitus: No Crepitus: No Crepitus: No Rash: No Rash: No Rash: No Scarring: No Scarring: No Periwound Skin Moisture: Maceration: No Maceration: No Maceration: No Dry/Scaly: No Dry/Scaly: No Dry/Scaly: No Periwound Skin Color: Erythema: Yes Rubor: Yes Rubor: Yes Rubor: Yes Atrophie Blanche: No Atrophie Blanche: No Cyanosis: No Cyanosis: No Ecchymosis: No Ecchymosis: No Erythema: No Erythema: No Hemosiderin Staining: No Hemosiderin Staining: No Mottled: No Mottled: No Pallor: No Pallor: No Erythema Location: Circumferential N/A N/A Temperature: No Abnormality N/A No Abnormality Tenderness on Palpation: Yes No Yes Wound Preparation: Ulcer Cleansing: Ulcer Cleansing: Ulcer Cleansing: Rinsed/Irrigated with Saline Rinsed/Irrigated with Saline Rinsed/Irrigated with Saline Topical Anesthetic Applied: Topical Anesthetic Applied: Topical Anesthetic Applied: Other: lidocaine 4% Other: lidocaine 4% None Treatment Notes Electronic Signature(s) Signed: 04/10/2018 5:00:45 PM By: Curtis Sites Entered By: Curtis Sites on 04/10/2018 15:41:31 [name] ([id number]) -------------------------------------------------------------------------------- Multi-Disciplinary Care Plan Details Patient Name: [name] Date of Service: 04/10/2018 2:45 PM Medical Record Number: [id number] Patient Account Number: [id number] Date of Birth/Sex: [birth year] (62 y.o. M) Treating RN: Curtis Sites Primary Care Leaner Morici: Franco Nones Other Clinician: Referring Buren Havey: Franco Nones Treating Kadir Azucena/Extender: STONE III, HOYT Weeks in Treatment: 14 Active Inactive ` Abuse / Safety / Falls / Self Care Management Nursing Diagnoses: Impaired physical mobility Goals: Patient will not develop complications from immobility Date Initiated: 01/02/2018 Target Resolution Date: 02/16/2018 Goal Status:  Active Interventions: Assess fall risk on admission and as needed Notes: ` Nutrition Nursing Diagnoses: Potential for alteratiion in Nutrition/Potential for imbalanced nutrition Goals: Patient/caregiver agrees to and verbalizes understanding of need to use nutritional supplements and/or vitamins as prescribed Date Initiated: 01/02/2018 Target Resolution Date: 03/17/2018 Goal Status: Active Interventions: Provide education on nutrition Notes: ` Orientation to the Wound Care Program Nursing Diagnoses: Knowledge deficit related to the wound healing center program Goals: Patient/caregiver will verbalize understanding of the Wound Healing Center Program Date Initiated: 01/02/2018 Target Resolution Date: 02/16/2018 Goal Status: Active Interventions: [name], [name] ([id number]) Provide education on orientation to the wound center Notes: ` Pressure Nursing Diagnoses: Potential for impaired tissue integrity related to pressure, friction, moisture, and shear Goals: Patient will remain free of pressure ulcers Date Initiated: 01/02/2018 Target Resolution Date: 03/17/2018 Goal Status: Active Interventions: Assess potential for pressure ulcer upon admission and as needed Notes: ` Wound/Skin Impairment Nursing Diagnoses: Impaired tissue integrity Goals: Ulcer/skin breakdown will heal within 14 weeks Date Initiated: 01/02/2018 Target Resolution Date: 03/16/2018 Goal Status: Active Interventions: Assess patient/caregiver ability to obtain necessary supplies Assess patient/caregiver ability to perform ulcer/skin care regimen upon admission and as needed Assess ulceration(s) every visit Notes: Electronic Signature(s) Signed: 04/10/2018 5:00:45 PM By: Curtis Sites Entered By: Curtis Sites on 04/10/2018 15:41:23 [name], Shawn Maduro ([id number]) -------------------------------------------------------------------------------- Pain Assessment Details  Patient Name: [name], [name] Date of  Service: 04/10/2018 2:45 PM Medical Record Number: [id number] Patient Account Number: [id number] Date of Birth/Sex: 1955-10-07 (62 y.o. M) Treating RN: Huel Coventry Primary Care Magalene Mclear: Franco Nones Other Clinician: Referring Sabeen Piechocki: Franco Nones Treating Naly Schwanz/Extender: Linwood Dibbles, HOYT Weeks in Treatment: 14 Active Problems Location of Pain Severity and Description of Pain Patient Has Paino No Site Locations Pain Management and Medication Current Pain Management: Electronic Signature(s) Signed: 04/10/2018 4:33:57 PM By: Elliot Gurney, BSN, RN, CWS, Kim RN, BSN Entered By: Elliot Gurney, BSN, RN, CWS, Kim on 04/10/2018 14:49:58 [name] ([id number]) -------------------------------------------------------------------------------- Patient/Caregiver Education Details Patient Name: [name] Date of Service: 04/10/2018 2:45 PM Medical Record Number: [id number] Patient Account Number: [id number] Date of Birth/Gender: Mar 02, 1956 (62 y.o. M) Treating RN: Curtis Sites Primary Care Physician: Franco Nones Other Clinician: Referring Physician: Franco Nones Treating Physician/Extender: Skeet Simmer in Treatment: 14 Education Assessment Education Provided To: Patient Education Topics Provided Pressure: Handouts: Other: repositioning and offloading Methods: Explain/Verbal Responses: State content correctly Wound/Skin Impairment: Handouts: Caring for Your Ulcer Methods: Explain/Verbal Responses: State content correctly Electronic Signature(s) Signed: 04/10/2018 4:16:12 PM By: Renne Crigler Entered By: Renne Crigler on 04/10/2018 15:58:35 [name] ([id number]) -------------------------------------------------------------------------------- Wound Assessment Details Patient Name: [name] Date of Service: 04/10/2018 2:45 PM Medical Record Number: [id number] Patient Account Number: [id number] Date of Birth/Sex: Oct 27, 1955 (62 y.o. M) Treating RN:  Huel Coventry Primary Care Harman Langhans: Franco Nones Other Clinician: Referring Vidhi Delellis: Franco Nones Treating Sinan Tuch/Extender: STONE III, HOYT Weeks in Treatment: 14 Wound Status Wound Number: 1R Primary Pressure Ulcer Etiology: Wound Location: Right Gluteus Wound Open Wounding Event: Pressure Injury Status: Date Acquired: 12/11/2017 Comorbid Chronic sinus problems/congestion, Anemia, Weeks Of Treatment: 14 History: Arrhythmia, Hepatitis C, Type II Diabetes Clustered Wound: No Photos Photo Uploaded By: Elliot Gurney, BSN, RN, CWS, Kim on 04/10/2018 16:25:03 Wound Measurements Length: (cm) 1.2 Width: (cm) 1.2 Depth: (cm) 0.1 Area: (cm) 1.131 Volume: (cm) 0.113 % Reduction in Area: 65.7% % Reduction in Volume: 65.8% Epithelialization: None Tunneling: No Undermining: No Wound Description Classification: Category/Stage III Foul Odor Wound Margin: Flat and Intact Slough/Fi Exudate Amount: Medium Exudate Type: Serous Exudate Color: amber After Cleansing: No brino No Wound Bed Granulation Amount: Large (67-100%) Exposed Structure Granulation Quality: Red Fascia Exposed: No Necrotic Amount: Small (1-33%) Fat Layer (Subcutaneous Tissue) Exposed: Yes Necrotic Quality: Adherent Slough Tendon Exposed: No Muscle Exposed: No Joint Exposed: No Bone Exposed: No Periwound Skin Texture Gunnels, [name] ([id number]) Texture Color No Abnormalities Noted: No No Abnormalities Noted: No Callus: No Erythema: Yes Crepitus: No Erythema Location: Circumferential Excoriation: No Rubor: Yes Induration: No Temperature / Pain Rash: No Temperature: No Abnormality Moisture Tenderness on Palpation: Yes No Abnormalities Noted: No Dry / Scaly: No Maceration: No Wound Preparation Ulcer Cleansing: Rinsed/Irrigated with Saline Topical Anesthetic Applied: Other: lidocaine 4%, Treatment Notes Wound #1R (Right Gluteus) 1. Cleansed with: Clean wound with Normal Saline 2.  Anesthetic Topical Lidocaine 4% cream to wound bed prior to debridement 4. Dressing Applied: Hydrafera Blue 5. Secondary Dressing Applied Bordered Foam Dressing Electronic Signature(s) Signed: 04/10/2018 4:33:57 PM By: Elliot Gurney, BSN, RN, CWS, Kim RN, BSN Entered By: Elliot Gurney, BSN, RN, CWS, Kim on 04/10/2018 15:00:05 [name] ([id number]) -------------------------------------------------------------------------------- Wound Assessment Details Patient Name: [name] Date of Service: 04/10/2018 2:45 PM Medical Record Number: [id number] Patient Account Number: [id number] Date of Birth/Sex: 1956-02-15 (62 y.o. M) Treating RN: Huel Coventry Primary Care Garrie Woodin: Franco Nones Other Clinician: Referring Alga Southall: Franco Nones Treating Chestina Komatsu/Extender: STONE III, HOYT Weeks in Treatment: 14  Wound Status Wound Number: 3 Primary Pressure Ulcer Etiology: Wound Location: Right Ischium Wound Open Wounding Event: Pressure Injury Status: Date Acquired: 12/15/2017 Comorbid Chronic sinus problems/congestion, Anemia, Weeks Of Treatment: 14 History: Arrhythmia, Hepatitis C, Type II Diabetes Clustered Wound: No Photos Photo Uploaded By: Elliot Gurney, BSN, RN, CWS, Kim on 04/10/2018 16:25:03 Wound Measurements Length: (cm) 2.5 Width: (cm) 2.5 Depth: (cm) 0.1 Area: (cm) 4.909 Volume: (cm) 0.491 % Reduction in Area: 34.9% % Reduction in Volume: 34.9% Epithelialization: None Tunneling: No Undermining: No Wound Description Classification: Category/Stage III Foul Odor Wound Margin: Flat and Intact Slough/Fi Exudate Amount: Large Exudate Type: Serous Exudate Color: amber After Cleansing: No brino No Wound Bed Granulation Amount: Large (67-100%) Exposed Structure Granulation Quality: Red Fascia Exposed: No Necrotic Amount: Small (1-33%) Fat Layer (Subcutaneous Tissue) Exposed: Yes Necrotic Quality: Adherent Slough Tendon Exposed: No Muscle Exposed: No Joint Exposed: No Bone  Exposed: No Periwound Skin Texture [name], Ferdie ([id number]) Texture Color No Abnormalities Noted: No No Abnormalities Noted: No Callus: No Atrophie Blanche: No Crepitus: No Cyanosis: No Excoriation: No Ecchymosis: No Induration: No Erythema: No Rash: No Hemosiderin Staining: No Scarring: No Mottled: No Pallor: No Moisture Rubor: Yes No Abnormalities Noted: No Dry / Scaly: No Maceration: No Wound Preparation Ulcer Cleansing: Rinsed/Irrigated with Saline Topical Anesthetic Applied: Other: lidocaine 4%, Treatment Notes Wound #3 (Right Ischium) 1. Cleansed with: Clean wound with Normal Saline 2. Anesthetic Topical Lidocaine 4% cream to wound bed prior to debridement 4. Dressing Applied: Hydrafera Blue 5. Secondary Dressing Applied Bordered Foam Dressing Electronic Signature(s) Signed: 04/10/2018 4:33:57 PM By: Elliot Gurney, BSN, RN, CWS, Kim RN, BSN Entered By: Elliot Gurney, BSN, RN, CWS, Kim on 04/10/2018 15:00:46 [name] ([id number]) -------------------------------------------------------------------------------- Wound Assessment Details Patient Name: [name] Date of Service: 04/10/2018 2:45 PM Medical Record Number: [id number] Patient Account Number: [id number] Date of Birth/Sex: [birth year] (62 y.o. M) Treating RN: Huel Coventry Primary Care Teirra Carapia: Franco Nones Other Clinician: Referring Yazmyne Sara: Franco Nones Treating Anwyn Kriegel/Extender: STONE III, HOYT Weeks in Treatment: 14 Wound Status Wound Number: 6 Primary Pressure Ulcer Etiology: Wound Location: Right Ischium - Distal Wound Open Wounding Event: Shear/Friction Status: Date Acquired: 04/06/2018 Comorbid Chronic sinus problems/congestion, Anemia, Weeks Of Treatment: 0 History: Arrhythmia, Hepatitis C, Type II Diabetes Clustered Wound: No Photos Photo Uploaded By: Elliot Gurney, BSN, RN, CWS, Kim on 04/10/2018 08:65:78 Wound Measurements Length: (cm) 1 Width: (cm) 1.2 Depth: (cm) 0.1 Area: (cm)  0.942 Volume: (cm) 0.094 % Reduction in Area: 0% % Reduction in Volume: 0% Epithelialization: None Tunneling: No Undermining: No Wound Description Classification: Category/Stage II Wound Margin: Flat and Intact Exudate Amount: Medium Exudate Type: Serosanguineous Exudate Color: red, brown Foul Odor After Cleansing: No Slough/Fibrino Yes Wound Bed Granulation Amount: Large (67-100%) Exposed Structure Granulation Quality: Red Fat Layer (Subcutaneous Tissue) Exposed: Yes Necrotic Amount: Small (1-33%) Necrotic Quality: Adherent Slough Periwound Skin Texture Texture Color No Abnormalities Noted: No No Abnormalities Noted: No Callus: No Atrophie Blanche: No [name], [name] ([id number]) Crepitus: No Cyanosis: No Excoriation: No Ecchymosis: No Induration: No Erythema: No Rash: No Hemosiderin Staining: No Scarring: No Mottled: No Pallor: No Moisture Rubor: Yes No Abnormalities Noted: No Dry / Scaly: No Temperature / Pain Maceration: No Temperature: No Abnormality Tenderness on Palpation: Yes Wound Preparation Ulcer Cleansing: Rinsed/Irrigated with Saline Topical Anesthetic Applied: None Treatment Notes Wound #6 (Right, Distal Ischium) 1. Cleansed with: Clean wound with Normal Saline 2. Anesthetic Topical Lidocaine 4% cream to wound bed prior to debridement 4. Dressing Applied: Hydrafera Blue 5. Secondary Dressing Applied Bordered Foam  Dressing Electronic Signature(s) Signed: 04/10/2018 4:33:57 PM By: Elliot Gurney, BSN, RN, CWS, Kim RN, BSN Entered By: Elliot Gurney, BSN, RN, CWS, Kim on 04/10/2018 15:01:39 [name] ([id number]) -------------------------------------------------------------------------------- Vitals Details Patient Name: [name] Date of Service: 04/10/2018 2:45 PM Medical Record Number: [id number] Patient Account Number: [id number] Date of Birth/Sex: 1955/09/13 (62 y.o. M) Treating RN: Huel Coventry Primary Care Shaneika Rossa: Franco Nones Other  Clinician: Referring Kavir Savoca: Franco Nones Treating Marley Charlot/Extender: STONE III, HOYT Weeks in Treatment: 14 Vital Signs Time Taken: 14:50 Temperature (F): 98.3 Height (in): 70 Pulse (bpm): 79 Weight (lbs): 155 Respiratory Rate (breaths/min): 16 Body Mass Index (BMI): 22.2 Blood Pressure (mmHg): 114/82 Reference Range: 80 - 120 mg / dl Electronic Signature(s) Signed: 04/10/2018 4:33:57 PM By: Elliot Gurney, BSN, RN, CWS, Kim RN, BSN Entered By: Elliot Gurney, BSN, RN, CWS, Kim on 04/10/2018 14:50:23

## 2018-04-14 NOTE — Progress Notes (Signed)
[name] ([id number]) Visit Report for 04/10/2018 Chief Complaint Document Details Patient Name: [name] Date of Service: 04/10/2018 2:45 PM Medical Record Number: [id number] Patient Account Number: [id number] Date of Birth/Sex: 05/30/1956 (62 y.o. M) Treating RN: Curtis Sites Primary Care Provider: Franco Nones Other Clinician: Referring Provider: Franco Nones Treating Provider/Extender: Linwood Dibbles, Sherrita Riederer Weeks in Treatment: 14 Information Obtained from: Patient Chief Complaint Bilateral Gluteal Ulcers Electronic Signature(s) Signed: 04/11/2018 8:03:16 PM By: Lenda Kelp PA-C Entered By: Lenda Kelp on 04/10/2018 15:25:32 [name] ([id number]) -------------------------------------------------------------------------------- HPI Details Patient Name: [name] Date of Service: 04/10/2018 2:45 PM Medical Record Number: [id number] Patient Account Number: [id number] Date of Birth/Sex: August 09, 1955 (62 y.o. M) Treating RN: Curtis Sites Primary Care Provider: Franco Nones Other Clinician: Referring Provider: Franco Nones Treating Provider/Extender: Linwood Dibbles, Mandie Crabbe Weeks in Treatment: 14 History of Present Illness HPI Description: 09/14/17-he is here for evaluation of the left lower extremity injury. He is a resident of a group home and is accompanied by facility staff. He went to his primary care on 2/12 for a left lower extremity wound. At that appointment a wound culture was taken. A referral for wound care services was ordered on 2/26 and according to facility med rec he was started on Bactrim on 3/5. The patient and facility staff are very poor historians. It is assumed that the injury to the left leg was from scratching. He presents today with no open area, no erythema, no evidence of drainage. He is going to PCP later today for follow up. Readmission: 01/02/18 on evaluation today patient presents for reevaluation in the clinic although this is  for a separate issue. He actually has several states to the pressure ulcers along his bilateral gluteal region which apparently have been present for several weeks now. He did see his primary care provider two weeks ago and they did initiate treatment. Home health was finally obtained although they were having a difficult time getting home health. It sounds like doing is actually used for the wound beds obviously don't think this is gonna be the best treatment option for him at this point. Fortunately there does not appear to be any evidence of infection at this time the patient does have discomfort but not as much as he apparently had in the past. When he was originally dealing with these before going to see his primary care provider the pain was much more significant. He does have a foam cushion for his wheelchair although I think being that he is wheelchair dependent he may benefit from a Roho cushion at this point. He also may benefit from an air mattress in my pinion he does have a hospital bed but again between the bed and his wheelchair he pretty much spends all of his time during the day and one of these two locations. I do think he staying too long in the wheelchair as far as sitting up as well which also I think needs to be addressed I did have a long conversation with him today concerning the fact that he needs to rotate positions and locations at least every two hours. This is something we're going to put on the notes as well to sin with him to the facility. I do think that this is going to be of utmost importance the dressings can be helpful but they are not gonna be able to manage this completely alone. He does have some hyper granular tissue noted. 01/09/18 on evaluation today patient presents for reevaluation concerning  multiple pressure ulcers noted of the bilateral gluteal region. He has been tolerating the dressing changes without complication the good news is he seems to be making  great progress at this point in time. There does not appear to be any evidence of infection at this time. No fevers, chills, nausea, or vomiting noted at this time. 01/23/18 Patient's wounds seem to show signs of good improvement at this point in time. He has been tolerating the dressing changes without complication. With that being said I do believe that he has been making excellent progress over the past several weeks. No fevers, chills, nausea, or vomiting noted at this time. 02/06/18 on evaluation today patient appears to be doing much better in regard to his gluteal and Ischial ulcers. He's been tolerating the dressing changes without complication the Hydrofera Blue Dressing be doing excellent for him. Overall I'm very pleased in this regard. He's having no significant pain. 03/07/18 on evaluation today patient appears to be doing much better in regard to his ulcers in the sacral, gluteal, Ischial locations. Overall in fact he only has two wounds remaining that appear to be open. This is the right gluteus and right ischial locations. Other than this everything that I have been managing up to this point appears greatly improved. Overall the patient states he's having minimal pain. He does have a foam cushion for his wheelchair. 03/22/18 on evaluation today patient actually appears to be doing much better in regard to his gluteal/Ischial wounds. In fact he just has one area remaining at this point in time in the right gluteal region. Fortunately he has made excellent progress at this point. [name], [name] ([id number]) 04/10/18 on evaluation today patient actually appears to be doing worse compared to his previous evaluation. He tells me unfortunately that he has been doing a lot more sitting and getting in the bed last. I think this has directly led to a breakdown in the overall status of his wounds as well is pressure injury surrounding the wounds in the gluteal/sacral region in general. Obviously  this is not good and definitely something that we have been wanting to try to avoid. Subsequently he also tells me the home health is not been out changes dressings according to the patient since last Wednesday. Electronic Signature(s) Signed: 04/11/2018 8:03:16 PM By: Lenda Kelp PA-C Entered By: Lenda Kelp on 04/11/2018 19:56:22 [name], [name] ([id number]) -------------------------------------------------------------------------------- Physical Exam Details Patient Name: [name] Date of Service: 04/10/2018 2:45 PM Medical Record Number: [id number] Patient Account Number: [id number] Date of Birth/Sex: [birth year] (62 y.o. M) Treating RN: Curtis Sites Primary Care Provider: Franco Nones Other Clinician: Referring Provider: Franco Nones Treating Provider/Extender: STONE III, Nathalee Smarr Weeks in Treatment: 14 Constitutional Well-nourished and well-hydrated in no acute distress. Respiratory normal breathing without difficulty. clear to auscultation bilaterally. Cardiovascular regular rate and rhythm with normal S1, S2. Psychiatric this patient is able to make decisions and demonstrates good insight into disease process. Alert and Oriented x 3. pleasant and cooperative. Notes Patient's wound beds did not appear to show any signs of necrotic material the surface of the wounds. He has seem to tolerate the dressings currently without complication. Unfortunately the biggest issue I see is that he is likely getting pressure to the area which is causing the main issue at this time. Electronic Signature(s) Signed: 04/11/2018 8:03:16 PM By: Lenda Kelp PA-C Entered By: Lenda Kelp on 04/11/2018 19:57:02 [name] ([id number]) -------------------------------------------------------------------------------- Physician Orders Details Patient Name: [name] Date of Service:  04/10/2018 2:45 PM Medical Record Number: [id number] Patient Account Number:  [id number] Date of Birth/Sex: 1956/04/04 (62 y.o. 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 o Cleanse wound with mild soap and water o May Shower, gently pat wound dry prior to applying new dressing. Wound #3 Right Ischium o Clean wound with Normal Saline. o Cleanse wound with mild soap and water o May Shower, gently pat wound dry prior to applying new dressing. Wound #6 Right,Distal Ischium o Clean wound with Normal Saline. o Cleanse wound with mild soap and water o May Shower, gently pat wound dry prior to applying new dressing. Anesthetic (add to Medication List) Wound #1R Right Gluteus o Topical Lidocaine 4% cream applied to wound bed prior to debridement (In Clinic Only). Wound #3 Right Ischium o Topical Lidocaine 4% cream applied to wound bed prior to debridement (In Clinic Only). Wound #6 Right,Distal Ischium o Topical Lidocaine 4% cream applied to wound bed prior to debridement (In Clinic Only). Primary Wound Dressing Wound #1R Right Gluteus o Hydrafera Blue Ready Transfer Wound #3 Right Ischium o Hydrafera Blue Ready Transfer [name], [name] ([id number]) Wound #6 Right,Distal Ischium o Hydrafera Blue Ready Transfer Secondary Dressing Wound #1R Right Gluteus o Boardered Foam Dressing Wound #3 Right Ischium o Boardered Foam  Dressing Wound #6 Right,Distal Ischium o Boardered Foam Dressing Dressing Change Frequency Wound #1R Right Gluteus o Change Dressing Monday, Wednesday, Friday - an as needed.Marland KitchenMarland KitchenHHRN to change the dressing for patient 3 times weekly because patient cannot change the dressing himself. Wound #3 Right Ischium o Change Dressing Monday, Wednesday, Friday - an as needed.Marland KitchenMarland KitchenHHRN to change the dressing for patient 3 times weekly because patient cannot change the dressing himself. Wound #6 Right,Distal Ischium o Change Dressing Monday, Wednesday, Friday - an as needed.Marland KitchenMarland KitchenHHRN to change the dressing for patient 3 times weekly because patient cannot change the dressing himself. Follow-up Appointments Wound #3 Right Ischium o Return Appointment in 2 weeks. Off-Loading Wound #1R Right Gluteus o Turn and reposition every 2 hours Wound #3 Right Ischium o Turn and reposition every 2 hours Wound #6 Right,Distal Ischium o Turn and reposition every 2 hours Additional Orders / Instructions Wound #1R Right Gluteus o Increase protein intake. o Activity as tolerated Wound #3 Right Ischium o Increase protein intake. o Activity as tolerated Wound #6 Right,Distal Ischium o Increase protein intake. o Activity as tolerated Home Health [name], [name] ([id number]) Wound #1R Right Gluteus o Continue Home Health Visits - [facility] to change the dressing for patient 3 times weekly because patient cannot change the dressing himself. o Home Health Nurse may visit PRN to address patientos wound care needs. o FACE TO FACE ENCOUNTER: MEDICARE and MEDICAID PATIENTS: I certify that this patient is under my care and that I had a face-to-face encounter that meets the physician face-to-face encounter requirements with this patient on this date. The encounter with the patient was in whole or in part for the following MEDICAL CONDITION: (primary reason for Home Healthcare) MEDICAL  NECESSITY: I  certify, that based on my findings, NURSING services are a medically necessary home health service. HOME BOUND STATUS: I certify that my clinical findings support that this patient is homebound (i.e., Due to illness or injury, pt requires aid of supportive devices such as crutches, cane, wheelchairs, walkers, the use of special transportation or the assistance of another person to leave their place of residence. There is a normal inability to leave the home and doing so requires considerable and taxing effort. Other absences are for medical reasons / religious services and are infrequent or of short duration when for other reasons). o If current dressing causes regression in wound condition, may D/C ordered dressing product/s and apply Normal Saline Moist Dressing daily until next Wound Healing Center / Other MD appointment. Notify Wound Healing Center of regression in wound condition at [phone]. o Please direct any NON-WOUND related issues/requests for orders to patient's Primary Care Physician Wound #3 Right Ischium o Continue Home Health Visits - [facility] to change the dressing for patient 3 times weekly because patient cannot change the dressing himself. o Home Health Nurse may visit PRN to address patientos wound care needs. o FACE TO FACE ENCOUNTER: MEDICARE and MEDICAID PATIENTS: I certify that this patient is under my care and that I had a face-to-face encounter that meets the physician face-to-face encounter requirements with this patient on this date. The encounter with the patient was in whole or in part for the following MEDICAL CONDITION: (primary reason for Home Healthcare) MEDICAL NECESSITY: I certify, that based on my findings, NURSING services are a medically necessary home health service. HOME BOUND STATUS: I certify that my clinical findings support that this patient is homebound (i.e., Due to illness or injury, pt requires aid of supportive  devices such as crutches, cane, wheelchairs, walkers, the use of special transportation or the assistance of another person to leave their place of residence. There is a normal inability to leave the home and doing so requires considerable and taxing effort. Other absences are for medical reasons / religious services and are infrequent or of short duration when for other reasons). o If current dressing causes regression in wound condition, may D/C ordered dressing product/s and apply Normal Saline Moist Dressing daily until next Wound Healing Center / Other MD appointment. Notify Wound Healing Center of regression in wound condition at [phone]. o Please direct any NON-WOUND related issues/requests for orders to patient's Primary Care Physician Wound #6 Right,Distal Ischium o Continue Home Health Visits - [facility] to change the dressing for patient 3 times weekly because patient cannot change the dressing himself. o Home Health Nurse may visit PRN to address patientos wound care needs. o FACE TO FACE ENCOUNTER: MEDICARE and MEDICAID PATIENTS: I certify that this patient is under my care and that I had a face-to-face encounter that meets the physician face-to-face encounter requirements with this patient on this date. The encounter with the patient was in whole or in part for the following MEDICAL CONDITION: (primary reason for Home Healthcare) MEDICAL NECESSITY: I certify, that based on my findings, NURSING services are a medically necessary home health service. HOME BOUND STATUS: I certify that my clinical findings support that this patient is homebound (i.e., Due to illness or injury, pt requires aid of supportive devices such as crutches, cane, wheelchairs, walkers, the use of special transportation or the assistance of another person to leave their place of residence. There is a normal inability to leave the home and doing so  requires considerable and taxing effort.  Other absences are for medical reasons / religious services and are infrequent or of short duration when for other reasons). o If current dressing causes regression in wound condition, may D/C ordered dressing product/s and apply Normal Saline Moist Dressing daily until next Wound Healing Center / Other MD appointment. Notify Wound Healing Center of regression in wound condition at [phone]. o Please direct any NON-WOUND related issues/requests for orders to patient's Primary Care Physician [name], [name] ([id number]) Electronic Signature(s) Signed: 04/10/2018 5:00:45 PM By: Curtis Sites Signed: 04/11/2018 8:03:16 PM By: Lenda Kelp PA-C Entered By: Curtis Sites on 04/10/2018 15:44:58 [name] ([id number]) -------------------------------------------------------------------------------- Problem List Details Patient Name: [name] Date of Service: 04/10/2018 2:45 PM Medical Record Number: [id number] Patient Account Number: [id number] Date of Birth/Sex: 1956-03-12 (62 y.o. 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 01/02/2018 No Yes F17.210 Nicotine dependence, cigarettes, uncomplicated 01/02/2018 No Yes K70.30 Alcoholic cirrhosis of liver without ascites 01/02/2018 No Yes Inactive Problems Resolved Problems Electronic Signature(s) Signed: 04/11/2018 8:03:16 PM By: Lenda Kelp PA-C Entered By: Lenda Kelp on 04/10/2018 15:25:27 [name]  ([id number]) -------------------------------------------------------------------------------- Progress Note Details Patient Name: [name] Date of Service: 04/10/2018 2:45 PM Medical Record Number: [id number] Patient Account Number: [id number] Date of Birth/Sex: 16-May-1956 (62 y.o. M) Treating RN: Curtis Sites Primary Care Provider: Franco Nones Other Clinician: Referring Provider: Franco Nones Treating Provider/Extender: Linwood Dibbles, Kailand Seda Weeks in Treatment: 14 Subjective Chief Complaint Information obtained from Patient Bilateral Gluteal Ulcers History of Present Illness (HPI) 09/14/17-he is here for evaluation of the left lower extremity injury. He is a resident of a group home and is accompanied by facility staff. He went to his primary care on 2/12 for a left lower extremity wound. At that appointment a wound culture was taken. A referral for wound care services was ordered on 2/26 and according to facility med rec he was started on Bactrim on 3/5. The patient and facility staff are very poor historians. It is assumed that the injury to the left leg was from scratching. He presents today with no open area, no erythema, no evidence of drainage. He is going to PCP later today for follow up. Readmission: 01/02/18 on evaluation today patient presents for reevaluation in the clinic although this is for a separate issue. He actually has several states to the pressure ulcers along his bilateral gluteal region which apparently have been present for several weeks now. He did see his primary care provider two weeks ago and they did initiate treatment. Home health was finally obtained although they were having a difficult time getting home health. It sounds like doing is actually used for the wound beds obviously don't think this is gonna be the best treatment option for him at this point. Fortunately there does not appear to be any evidence of infection at this time the patient does have  discomfort but not as much as he apparently had in the past. When he was originally dealing with these before going to see his primary care provider the pain was much more  significant. He does have a foam cushion for his wheelchair although I think being that he is wheelchair dependent he may benefit from a Roho cushion at this point. He also may benefit from an air mattress in my pinion he does have a hospital bed but again between the bed and his wheelchair he pretty much spends all of his time during the day and one of these two locations. I do think he staying too long in the wheelchair as far as sitting up as well which also I think needs to be addressed I did have a long conversation with him today concerning the fact that he needs to rotate positions and locations at least every two hours. This is something we're going to put on the notes as well to sin with him to the facility. I do think that this is going to be of utmost importance the dressings can be helpful but they are not gonna be able to manage this completely alone. He does have some hyper granular tissue noted. 01/09/18 on evaluation today patient presents for reevaluation concerning multiple pressure ulcers noted of the bilateral gluteal region. He has been tolerating the dressing changes without complication the good news is he seems to be making great progress at this point in time. There does not appear to be any evidence of infection at this time. No fevers, chills, nausea, or vomiting noted at this time. 01/23/18 Patient's wounds seem to show signs of good improvement at this point in time. He has been tolerating the dressing changes without complication. With that being said I do believe that he has been making excellent progress over the past several weeks. No fevers, chills, nausea, or vomiting noted at this time. 02/06/18 on evaluation today patient appears to be doing much better in regard to his gluteal and Ischial ulcers.  He's been tolerating the dressing changes without complication the Hydrofera Blue Dressing be doing excellent for him. Overall I'm very pleased in this regard. He's having no significant pain. 03/07/18 on evaluation today patient appears to be doing much better in regard to his ulcers in the sacral, gluteal, Ischial locations. Overall in fact he only has two wounds remaining that appear to be open. This is the right gluteus and right ischial locations. Other than this everything that I have been managing up to this point appears greatly improved. Overall the patient states he's having minimal pain. He does have a foam cushion for his wheelchair. [name], [name] ([id number]) 03/22/18 on evaluation today patient actually appears to be doing much better in regard to his gluteal/Ischial wounds. In fact he just has one area remaining at this point in time in the right gluteal region. Fortunately he has made excellent progress at this point. 04/10/18 on evaluation today patient actually appears to be doing worse compared to his previous evaluation. He tells me unfortunately that he has been doing a lot more sitting and getting in the bed last. I think this has directly led to a breakdown in the overall status of his wounds as well is pressure injury surrounding the wounds in the gluteal/sacral region in general. Obviously this is not good and definitely something that we have been wanting to try to avoid. Subsequently he also tells me the home health is not been out changes dressings according to the patient since last Wednesday. Patient History Information obtained from Patient. Family History Heart Disease - Siblings, Hypertension - Siblings, Lung Disease - Siblings, No family history of Cancer, Diabetes, Hereditary  Spherocytosis, Kidney Disease, Seizures, Stroke, Thyroid Problems, Tuberculosis. Social History Current every day smoker, Marital Status - Single, Alcohol Use - Never - history of ETOH  abuse, Drug Use - Prior History, Caffeine Use - Daily. Medical And Surgical History Notes Gastrointestinal Colostomy Musculoskeletal R AKA Review of Systems (ROS) Constitutional Symptoms (General Health) Denies complaints or symptoms of Fever, Chills. Respiratory The patient has no complaints or symptoms. Cardiovascular The patient has no complaints or symptoms. Psychiatric The patient has no complaints or symptoms. Objective Constitutional Well-nourished and well-hydrated in no acute distress. Vitals Time Taken: 2:50 PM, Height: 70 in, Weight: 155 lbs, BMI: 22.2, Temperature: 98.3 F, Pulse: 79 bpm, Respiratory Rate: 16 breaths/min, Blood Pressure: 114/82 mmHg. Respiratory normal breathing without difficulty. clear to auscultation bilaterally. [name], [name] ([id number]) Cardiovascular regular rate and rhythm with normal S1, S2. Psychiatric this patient is able to make decisions and demonstrates good insight into disease process. Alert and Oriented x 3. pleasant and cooperative. General Notes: Patient's wound beds did not appear to show any signs of necrotic material the surface of the wounds. He has seem to tolerate the dressings currently without complication. Unfortunately the biggest issue I see is that he is likely getting pressure to the area which is causing the main issue at this time. Integumentary (Hair, Skin) Wound #1R status is Open. Original cause of wound was Pressure Injury. The wound is located on the Right Gluteus. The wound measures 1.2cm length x 1.2cm width x 0.1cm depth; 1.131cm^2 area and 0.113cm^3 volume. There is Fat Layer (Subcutaneous Tissue) Exposed exposed. There is no tunneling or undermining noted. There is a medium amount of serous drainage noted. The wound margin is flat and intact. There is large (67-100%) red granulation within the wound bed. There is a small (1-33%) amount of necrotic tissue within the wound bed including Adherent Slough. The  periwound skin appearance exhibited: Rubor, Erythema. The periwound skin appearance did not exhibit: Callus, Crepitus, Excoriation, Induration, Rash, Dry/Scaly, Maceration. The surrounding wound skin color is noted with erythema which is circumferential. Periwound temperature was noted as No Abnormality. The periwound has tenderness on palpation. Wound #3 status is Open. Original cause of wound was Pressure Injury. The wound is located on the Right Ischium. The wound measures 2.5cm length x 2.5cm width x 0.1cm depth; 4.909cm^2 area and 0.491cm^3 volume. There is Fat Layer (Subcutaneous Tissue) Exposed exposed. There is no tunneling or undermining noted. There is a large amount of serous drainage noted. The wound margin is flat and intact. There is large (67-100%) red granulation within the wound bed. There is a small (1-33%) amount of necrotic tissue within the wound bed including Adherent Slough. The periwound skin appearance exhibited: Rubor. The periwound skin appearance did not exhibit: Callus, Crepitus, Excoriation, Induration, Rash, Scarring, Dry/Scaly, Maceration, Atrophie Blanche, Cyanosis, Ecchymosis, Hemosiderin Staining, Mottled, Pallor, Erythema. Wound #6 status is Open. Original cause of wound was Shear/Friction. The wound is located on the Right,Distal Ischium. The wound measures 1cm length x 1.2cm width x 0.1cm depth; 0.942cm^2 area and 0.094cm^3 volume. There is Fat Layer (Subcutaneous Tissue) Exposed exposed. There is no tunneling or undermining noted. There is a medium amount of serosanguineous drainage noted. The wound margin is flat and intact. There is large (67-100%) red granulation within the wound bed. There is a small (1-33%) amount of necrotic tissue within the wound bed including Adherent Slough. The periwound skin appearance exhibited: Rubor. The periwound skin appearance did not exhibit: Callus, Crepitus, Excoriation, Induration, Rash, Scarring, Dry/Scaly, Maceration,  Atrophie Blanche, Cyanosis, Ecchymosis, Hemosiderin Staining, Mottled, Pallor, Erythema. Periwound temperature was noted as No Abnormality. The periwound has tenderness on palpation. 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 Cleanse wound with mild soap and water May Shower, gently pat wound dry prior to applying new dressing. Wound #3 Right Ischium: Clean wound with Normal Saline. Cleanse wound with mild soap and water May Shower, gently pat wound dry prior to applying new dressing. Wound #6 Right,Distal Ischium: Clean wound with Normal Saline. Cleanse wound with mild soap and water May Shower, gently pat wound dry prior to applying new dressing. Anesthetic (add to Medication List): Wound #1R Right Gluteus: Topical Lidocaine 4% cream applied to wound bed prior to debridement (In Clinic Only). Wound #3 Right Ischium: Topical Lidocaine 4% cream applied to wound bed prior to debridement (In Clinic Only). Wound #6 Right,Distal Ischium: Topical Lidocaine 4% cream applied to wound bed prior to debridement (In Clinic Only). Primary Wound Dressing: Wound #1R Right Gluteus: Hydrafera Blue Ready Transfer Wound #3 Right Ischium: Hydrafera Blue Ready Transfer Wound #6 Right,Distal Ischium: Hydrafera Blue Ready Transfer Secondary Dressing: Wound #1R Right Gluteus: Boardered Foam Dressing Wound #3 Right Ischium: Boardered Foam Dressing Wound #6 Right,Distal Ischium: Boardered Foam Dressing Dressing Change Frequency: Wound #1R Right Gluteus: Change Dressing Monday, Wednesday, Friday - an as needed.Marland KitchenMarland KitchenHHRN to change the dressing for  patient 3 times weekly because patient cannot change the dressing himself. Wound #3 Right Ischium: Change Dressing Monday, Wednesday, Friday - an as needed.Marland KitchenMarland KitchenHHRN to change the dressing for patient 3 times weekly because patient cannot change the dressing himself. Wound #6 Right,Distal Ischium: Change Dressing Monday, Wednesday, Friday - an as needed.Marland KitchenMarland KitchenHHRN to change the dressing for patient 3 times weekly because patient cannot change the dressing himself. Follow-up Appointments: Wound #3 Right Ischium: Return Appointment in 2 weeks. Off-Loading: Wound #1R Right Gluteus: Turn and reposition every 2 hours Wound #3 Right Ischium: Turn and reposition every 2 hours Wound #6 Right,Distal Ischium: Turn and reposition every 2 hours [name], [name] ([id number]) Additional Orders / Instructions: Wound #1R Right Gluteus: Increase protein intake. Activity as tolerated Wound #3 Right Ischium: Increase protein intake. Activity as tolerated Wound #6 Right,Distal Ischium: Increase protein intake. Activity as tolerated Home Health: Wound #1R Right Gluteus: Continue Home Health Visits - [facility] - H[facility]N to change the dressing for patient 3 times weekly because patient cannot change the dressing himself. Home Health Nurse may visit PRN to address patient s wound care needs. FACE TO FACE ENCOUNTER: MEDICARE and MEDICAID PATIENTS: I certify that this patient is under my care and that I had a face-to-face encounter that meets the physician face-to-face encounter requirements with this patient on this date. The encounter with the patient was in whole or in part for the following MEDICAL CONDITION: (primary reason for Home Healthcare) MEDICAL NECESSITY: I certify, that based on my findings, NURSING services are a medically necessary home health service. HOME BOUND STATUS: I certify that my clinical findings support that this patient is homebound (i.e., Due to illness or injury, pt requires aid of  supportive devices such as crutches, cane, wheelchairs, walkers, the use of special transportation or the assistance of another person to leave their place of residence. There is  a normal inability to leave the home and doing so requires considerable and taxing effort. Other absences are for medical reasons / religious services and are infrequent or of short duration when for other reasons). If current dressing causes regression in wound condition, may D/C ordered dressing product/s and apply Normal Saline Moist Dressing daily until next Wound Healing Center / Other MD appointment. Notify Wound Healing Center of regression in wound condition at [phone]. Please direct any NON-WOUND related issues/requests for orders to patient's Primary Care Physician Wound #3 Right Ischium: Continue Home Health Visits - [facility] - H[facility] to change the dressing for patient 3 times weekly because patient cannot change the dressing himself. Home Health Nurse may visit PRN to address patient s wound care needs. FACE TO FACE ENCOUNTER: MEDICARE and MEDICAID PATIENTS: I certify that this patient is under my care and that I had a face-to-face encounter that meets the physician face-to-face encounter requirements with this patient on this date. The encounter with the patient was in whole or in part for the following MEDICAL CONDITION: (primary reason for Home Healthcare) MEDICAL NECESSITY: I certify, that based on my findings, NURSING services are a medically necessary home health service. HOME BOUND STATUS: I certify that my clinical findings support that this patient is homebound (i.e., Due to illness or injury, pt requires aid of supportive devices such as crutches, cane, wheelchairs, walkers, the use of special transportation or the assistance of another person to leave their place of residence. There is a normal inability to leave the home and doing so requires considerable and taxing effort. Other absences are  for medical reasons / religious services and are infrequent or of short duration when for other reasons). If current dressing causes regression in wound condition, may D/C ordered dressing product/s and apply Normal Saline Moist Dressing daily until next Wound Healing Center / Other MD appointment. Notify Wound Healing Center of regression in wound condition at [phone]. Please direct any NON-WOUND related issues/requests for orders to patient's Primary Care Physician Wound #6 Right,Distal Ischium: Continue Home Health Visits - [facility] - HHRN to change the dressing for patient 3 times weekly because patient cannot change the dressing himself. Home Health Nurse may visit PRN to address patient s wound care needs. FACE TO FACE ENCOUNTER: MEDICARE and MEDICAID PATIENTS: I certify that this patient is under my care and that I had a face-to-face encounter that meets the physician face-to-face encounter requirements with this patient on this date. The encounter with the patient was in whole or in part for the following MEDICAL CONDITION: (primary reason for Home Healthcare) MEDICAL NECESSITY: I certify, that based on my findings, NURSING services are a medically necessary home health service. HOME BOUND STATUS: I certify that my clinical findings support that this patient is homebound (i.e., Due to illness or injury, pt requires aid of supportive devices such as crutches, cane, wheelchairs, walkers, the use of special transportation or the assistance of another person to leave their place of residence. There is a normal inability to leave the home and doing so requires considerable and taxing effort. Other absences are for medical reasons / religious services and are infrequent or of short duration when for other reasons). If current dressing causes regression in wound condition, may D/C ordered dressing product/s and apply Normal Saline Moist Dressing daily until next Wound Healing Center /  Other MD appointment. Notify Wound Healing Center of regression in [name], [name] ([id number]) wound condition at [phone]. Please direct any NON-WOUND related issues/requests  for orders to patient's Primary Care Physician My suggestion currently is going to be that we continue with the above wound care measures for the next week. The patient is in agreement the plan he also notes he's gonna need to stay off of his backside in order to offload we previously discussed this as well. Patient's caregiver from the group home was also present during the evaluation and discussion today as well. At this point we will initiate and continue the above wound care measures for the next period of time until follow-up. The patient is in agreement with plan. Anything changes or worsens in the interim he will contact our office for additional recommendations. Please see above for specific wound care orders. We will see patient for re-evaluation in 2 week(s) here in the clinic. If anything worsens or changes patient will contact our office for additional recommendations. Electronic Signature(s) Signed: 04/11/2018 8:03:16 PM By: Lenda Kelp PA-C Entered By: Lenda Kelp on 04/11/2018 19:57:53 [name] ([id number]) -------------------------------------------------------------------------------- ROS/PFSH Details Patient Name: [name] Date of Service: 04/10/2018 2:45 PM Medical Record Number: [id number] Patient Account Number: [id number] Date of Birth/Sex: [birth year] (62 y.o. M) Treating RN: Curtis Sites Primary Care Provider: Franco Nones Other Clinician: Referring Provider: Franco Nones Treating Provider/Extender: STONE III, Dhillon [place] Weeks in Treatment: 14 Information Obtained From Patient Wound History Do you currently have one or more open woundso Yes Approximately how long have you had your woundso 2 weeks How have you been treating your wound(s) until nowo Deuoderm Has your  wound(s) ever healed and then re-openedo No Have you had any lab work done in the past montho No Have you tested positive for an antibiotic resistant organism (MRSA, VRE)o No Have you tested positive for osteomyelitis (bone infection)o No Have you had any tests for circulation on your legso No Constitutional Symptoms (General Health) Complaints and Symptoms: Negative for: Fever; Chills Eyes Medical History: Negative for: Cataracts; Glaucoma; Optic Neuritis Ear/Nose/Mouth/Throat Medical History: Positive for: Chronic sinus problems/congestion Negative for: Middle ear problems Hematologic/Lymphatic Medical History: Positive for: Anemia Negative for: Hemophilia; Human Immunodeficiency Virus; Lymphedema; Sickle Cell Disease Respiratory Complaints and Symptoms: No Complaints or Symptoms Medical History: Negative for: Aspiration; Asthma; Chronic Obstructive Pulmonary Disease (COPD); Pneumothorax; Sleep Apnea; Tuberculosis Cardiovascular Complaints and Symptoms: No Complaints or Symptoms Medical History: Positive for: Arrhythmia [name], [name] ([id number]) Negative for: Angina; Congestive Heart Failure; Coronary Artery Disease; Deep Vein Thrombosis; Hypertension; Hypotension; Myocardial Infarction; Peripheral Arterial Disease; Peripheral Venous Disease; Phlebitis; Vasculitis Gastrointestinal Medical History: Positive for: Hepatitis C Negative for: Cirrhosis ; Colitis; Crohnos; Hepatitis A; Hepatitis B Past Medical History Notes: Colostomy Endocrine Medical History: Positive for: Type II Diabetes Time with diabetes: 1 year Treated with: Insulin Blood sugar tested every day: Yes Tested : Genitourinary Medical History: Negative for: End Stage Renal Disease Immunological Medical History: Negative for: Lupus Erythematosus; Raynaudos; Scleroderma Musculoskeletal Medical History: Past Medical History Notes: R AKA Neurologic Medical History: Negative for: Dementia;  Neuropathy; Quadriplegia; Paraplegia; Seizure Disorder Psychiatric Complaints and Symptoms: No Complaints or Symptoms HBO Extended History Items Ear/Nose/Mouth/Throat: Chronic sinus problems/congestion Immunizations Pneumococcal Vaccine: Received Pneumococcal Vaccination: Yes Immunization Notes: up to date Implantable Devices Family and Social History RONNELL, [name] ([id number]) Cancer: No; Diabetes: No; Heart Disease: Yes - Siblings; Hereditary Spherocytosis: No; Hypertension: Yes - Siblings; Kidney Disease: No; Lung Disease: Yes - Siblings; Seizures: No; Stroke: No; Thyroid Problems: No; Tuberculosis: No; Current every day smoker; Marital Status - Single; Alcohol Use: Never - history of ETOH abuse; Drug Use: Prior  History; Caffeine Use: Daily; Financial Concerns: No; Food, Clothing or Shelter Needs: No; Support System Lacking: No; Transportation Concerns: No; Advanced Directives: No; Patient does not want information on Advanced Directives Physician Affirmation I have reviewed and agree with the above information. Electronic Signature(s) Signed: 04/11/2018 8:03:16 PM By: Lenda Kelp PA-C Signed: 04/12/2018 5:10:46 PM By: Curtis Sites Entered By: Lenda Kelp on 04/11/2018 19:56:43 [name] ([id number]) -------------------------------------------------------------------------------- SuperBill Details Patient Name: [name] Date of Service: 04/10/2018 Medical Record Number: [id number] Patient Account Number: [id number] Date of Birth/Sex: 30-Jun-1956 (62 y.o. M) Treating RN: Curtis Sites Primary Care Provider: Franco Nones Other Clinician: Referring Provider: Franco Nones Treating Provider/Extender: STONE III, Jisel Fleet Weeks in Treatment: 14 Diagnosis Coding ICD-10 Codes Code Description L89.313 Pressure ulcer of right buttock, stage 3 L89.323 Pressure ulcer of left buttock, stage 3 Z99.3 Dependence on wheelchair Z93.3 Colostomy status Z89.611 Acquired  absence of right leg above knee F17.210 Nicotine dependence, cigarettes, uncomplicated K70.30 Alcoholic cirrhosis of liver without ascites Facility Procedures CPT4 Code: 08657846 Description: 99214 - WOUND CARE VISIT-LEV 4 EST PT Modifier: Quantity: 1 Physician Procedures CPT4 Code: [id number] Description: 99213 - WC PHYS LEVEL 3 - EST PT ICD-10 Diagnosis Description L89.313 Pressure ulcer of right buttock, stage 3 L89.323 Pressure ulcer of left buttock, stage 3 Z99.3 Dependence on wheelchair Z93.3 Colostomy status Modifier: Quantity: 1 Electronic Signature(s) Signed: 04/11/2018 8:03:16 PM By: Lenda Kelp PA-C Entered By: Lenda Kelp on 04/11/2018 19:58:13

## 2018-04-26 ENCOUNTER — Encounter: Payer: Medicaid Other | Admitting: Physician Assistant

## 2018-04-26 DIAGNOSIS — E11622 Type 2 diabetes mellitus with other skin ulcer: Secondary | ICD-10-CM | POA: Diagnosis not present

## 2018-04-28 NOTE — Progress Notes (Signed)
[name], [name] ([id number]) Visit Report for 04/26/2018 Arrival Information Details Patient Name: [name], [name] Date of Service: 04/26/2018 2:45 PM Medical Record Number: [id number] Patient Account Number: [id number] Date of Birth/Sex: [birth year] (62 y.o. M) Treating RN: Huel Coventry Primary Care Harding Thomure: Franco Nones Other Clinician: Referring Darneisha Windhorst: Franco Nones Treating Marquice Uddin/Extender: Linwood Dibbles, HOYT Weeks in Treatment: 16 Visit Information History Since Last Visit Added or deleted any medications: No Patient Arrived: Wheel Chair Any new allergies or adverse reactions: No Arrival Time: 15:05 Had a fall or experienced change in No activities of daily living that may affect Accompanied By: caregiver risk of falls: Transfer Assistance: None Signs or symptoms of abuse/neglect since last visito No Patient Identification Verified: Yes Hospitalized since last visit: No Secondary Verification Process Completed: Yes Implantable device outside of the clinic excluding No Patient Requires Transmission-Based No cellular tissue based products placed in the center Precautions: since last visit: Patient Has Alerts: No Has Dressing in Place as Prescribed: Yes Pain Present Now: No Electronic Signature(s) Signed: 04/26/2018 4:34:18 PM By: Dayton Martes RCP, RRT, CHT Entered By: Dayton Martes on 04/26/2018 15:06:32 [name] ([id number]) -------------------------------------------------------------------------------- Clinic Level of Care Assessment Details Patient Name: [name] Date of Service: 04/26/2018 2:45 PM Medical Record Number: [id number] Patient Account Number: [id number] Date of Birth/Sex: 10-24-1955 (62 y.o. M) Treating RN: Huel Coventry Primary Care Kalanie Fewell: Franco Nones Other Clinician: Referring Nissa Stannard: Franco Nones Treating Remy Dia/Extender: Linwood Dibbles, HOYT Weeks in Treatment: 16 Clinic Level of Care Assessment  Items TOOL 4 Quantity Score []  - Use when only an EandM is performed on FOLLOW-UP visit 0 ASSESSMENTS - Nursing Assessment / Reassessment []  - Reassessment of Co-morbidities (includes updates in patient status) 0 X- 1 5 Reassessment of Adherence to Treatment Plan ASSESSMENTS - Wound and Skin Assessment / Reassessment []  - Simple Wound Assessment / Reassessment - one wound 0 X- 1 5 Complex Wound Assessment / Reassessment - multiple wounds []  - 0 Dermatologic / Skin Assessment (not related to wound area) ASSESSMENTS - Focused Assessment []  - Circumferential Edema Measurements - multi extremities 0 []  - 0 Nutritional Assessment / Counseling / Intervention []  - 0 Lower Extremity Assessment (monofilament, tuning fork, pulses) []  - 0 Peripheral Arterial Disease Assessment (using hand held doppler) ASSESSMENTS - Ostomy and/or Continence Assessment and Care []  - Incontinence Assessment and Management 0 []  - 0 Ostomy Care Assessment and Management (repouching, etc.) PROCESS - Coordination of Care X - Simple Patient / Family Education for ongoing care 1 15 []  - 0 Complex (extensive) Patient / Family Education for ongoing care X- 1 10 Staff obtains Chiropractor, Records, Test Results / Process Orders []  - 0 Staff telephones HHA, Nursing Homes / Clarify orders / etc []  - 0 Routine Transfer to another Facility (non-emergent condition) []  - 0 Routine Hospital Admission (non-emergent condition) []  - 0 New Admissions / Manufacturing engineer / Ordering NPWT, Apligraf, etc. []  - 0 Emergency Hospital Admission (emergent condition) X- 1 10 Simple Discharge Coordination [name], [name] ([id number]) []  - 0 Complex (extensive) Discharge Coordination PROCESS - Special Needs []  - Pediatric / Minor Patient Management 0 []  - 0 Isolation Patient Management []  - 0 Hearing / Language / Visual special needs []  - 0 Assessment of Community assistance (transportation, D/C planning, etc.) []  -  0 Additional assistance / Altered mentation []  - 0 Support Surface(s) Assessment (bed, cushion, seat, etc.) INTERVENTIONS - Wound Cleansing / Measurement []  - Simple Wound Cleansing - one wound 0 X- 3 5 Complex Wound Cleansing -  multiple wounds X- 1 5 Wound Imaging (photographs - any number of wounds) []  - 0 Wound Tracing (instead of photographs) []  - 0 Simple Wound Measurement - one wound X- 3 5 Complex Wound Measurement - multiple wounds INTERVENTIONS - Wound Dressings []  - Small Wound Dressing one or multiple wounds 0 X- 1 15 Medium Wound Dressing one or multiple wounds X- 1 20 Large Wound Dressing one or multiple wounds []  - 0 Application of Medications - topical []  - 0 Application of Medications - injection INTERVENTIONS - Miscellaneous []  - External ear exam 0 []  - 0 Specimen Collection (cultures, biopsies, blood, body fluids, etc.) []  - 0 Specimen(s) / Culture(s) sent or taken to Lab for analysis []  - 0 Patient Transfer (multiple staff / Nurse, adult / Similar devices) []  - 0 Simple Staple / Suture removal (25 or less) []  - 0 Complex Staple / Suture removal (26 or more) []  - 0 Hypo / Hyperglycemic Management (close monitor of Blood Glucose) []  - 0 Ankle / Brachial Index (ABI) - do not check if billed separately X- 1 5 Vital Signs [name], [name] ([id number]) Has the patient been seen at the hospital within the last three years: Yes Total Score: 120 Level Of Care: New/Established - Level 4 Electronic Signature(s) Signed: 04/26/2018 5:03:58 PM By: Elliot Gurney, BSN, RN, CWS, Kim RN, BSN Entered By: Elliot Gurney, BSN, RN, CWS, Kim on 04/26/2018 16:24:34 [name] ([id number]) -------------------------------------------------------------------------------- Encounter Discharge Information Details Patient Name: [name] Date of Service: 04/26/2018 2:45 PM Medical Record Number: [id number] Patient Account Number: [id number] Date of Birth/Sex: [birth year] (62 y.o.  M) Treating RN: Huel Coventry Primary Care Mathis Cashman: Franco Nones Other Clinician: Referring Rhondalyn Clingan: Franco Nones Treating Nyeli Holtmeyer/Extender: Linwood Dibbles, HOYT Weeks in Treatment: 16 Encounter Discharge Information Items Discharge Condition: Stable Ambulatory Status: Wheelchair Discharge Destination: Home Transportation: Private Auto Accompanied By: caregiver Schedule Follow-up Appointment: Yes Clinical Summary of Care: Electronic Signature(s) Signed: 04/26/2018 4:25:37 PM By: Elliot Gurney, BSN, RN, CWS, Kim RN, BSN Entered By: Elliot Gurney, BSN, RN, CWS, Kim on 04/26/2018 16:25:37 [name] ([id number]) -------------------------------------------------------------------------------- Lower Extremity Assessment Details Patient Name: [name] Date of Service: 04/26/2018 2:45 PM Medical Record Number: [id number] Patient Account Number: [id number] Date of Birth/Sex: 06-19-1956 (62 y.o. M) Treating RN: Rema Jasmine Primary Care Cagney Steenson: Franco Nones Other Clinician: Referring Savi Lastinger: Franco Nones Treating Mutasim Tuckey/Extender: Linwood Dibbles, HOYT Weeks in Treatment: 16 Electronic Signature(s) Signed: 04/26/2018 4:02:04 PM By: Rema Jasmine Entered By: Rema Jasmine on 04/26/2018 15:21:34 [name] ([id number]) -------------------------------------------------------------------------------- Multi Wound Chart Details Patient Name: [name] Date of Service: 04/26/2018 2:45 PM Medical Record Number: [id number] Patient Account Number: [id number] Date of Birth/Sex: 07-17-1955 (62 y.o. M) Treating RN: Huel Coventry Primary Care Malikah Principato: Franco Nones Other Clinician: Referring Shakesha Soltau: Franco Nones Treating Arnesha Schiraldi/Extender: STONE III, HOYT Weeks in Treatment: 16 Vital Signs Height(in): 70 Pulse(bpm): 72 Weight(lbs): 155 Blood Pressure(mmHg): 136/81 Body Mass Index(BMI): 22 Temperature(F): 98.1 Respiratory Rate 16 (breaths/min): Photos: [1R:No Photos] [3:No Photos]  [6:No Photos] Wound Location: [1R:Right Gluteus] [3:Right Ischium] [6:Right, Distal Ischium] Wounding Event: [1R:Pressure Injury] [3:Pressure Injury] [6:Shear/Friction] Primary Etiology: [1R:Pressure Ulcer] [3:Pressure Ulcer] [6:Pressure Ulcer] Comorbid History: [1R:Chronic sinus problems/congestion, Anemia, Arrhythmia, Hepatitis C, Type II Diabetes] [3:Chronic sinus problems/congestion, Anemia, Arrhythmia, Hepatitis C, Type II Diabetes] [6:Chronic sinus problems/congestion, Anemia, Arrhythmia,  Hepatitis C, Type II Diabetes] Date Acquired: [1R:12/11/2017] [3:12/15/2017] [6:04/06/2018] Weeks of Treatment: [1R:16] [3:16] [6:2] Wound Status: [1R:Open] [3:Open] [6:Healed - Epithelialized] Wound Recurrence: [1R:Yes] [3:No] [6:No] Measurements L x W x D [1R:1x0.5x0.1] [3:1.4x1.4x0.1] [6:0x0x0] (cm) Area (cm) : [  1R:0.393] [3:1.539] [6:0] Volume (cm) : [1R:0.039] [3:0.154] [6:0] % Reduction in Area: [1R:88.10%] [3:79.60%] [6:100.00%] % Reduction in Volume: [1R:88.20%] [3:79.60%] [6:100.00%] Classification: [1R:Category/Stage III] [3:Category/Stage III] [6:Category/Stage II] Exudate Amount: [1R:Small] [3:Large] [6:None Present] Exudate Type: [1R:Serosanguineous] [3:Serous] [6:N/A] Exudate Color: [1R:red, brown] [3:amber] [6:N/A] Wound Margin: [1R:Flat and Intact] [3:Flat and Intact] [6:Flat and Intact] Granulation Amount: [1R:Medium (34-66%)] [3:Large (67-100%)] [6:None Present (0%)] Granulation Quality: [1R:Red] [3:Red] [6:N/A] Necrotic Amount: [1R:Small (1-33%)] [3:Small (1-33%)] [6:None Present (0%)] Exposed Structures: [1R:Fat Layer (Subcutaneous Tissue) Exposed: Yes Fascia: No Tendon: No Muscle: No Joint: No Bone: No] [3:Fat Layer (Subcutaneous Tissue) Exposed: Yes Fascia: No Tendon: No Muscle: No Joint: No Bone: No] [6:Fat Layer (Subcutaneous Tissue) Exposed: Yes] Epithelialization: [1R:None] [3:None] [6:None] Periwound Skin Texture: [1R:Excoriation: No Induration: No Callus: No]  [3:Excoriation: No Induration: No Callus: No] [6:Excoriation: No Induration: No Callus: No] Crepitus: No Crepitus: No Crepitus: No Rash: No Rash: No Rash: No Scarring: No Scarring: No Periwound Skin Moisture: Maceration: No Maceration: No Maceration: No Dry/Scaly: No Dry/Scaly: No Dry/Scaly: No Periwound Skin Color: Rubor: Yes Rubor: Yes Rubor: Yes Erythema: No Atrophie Blanche: No Atrophie Blanche: No Cyanosis: No Cyanosis: No Ecchymosis: No Ecchymosis: No Erythema: No Erythema: No Hemosiderin Staining: No Hemosiderin Staining: No Mottled: No Mottled: No Pallor: No Pallor: No Temperature: No Abnormality N/A No Abnormality Tenderness on Palpation: Yes No Yes Wound Preparation: Ulcer Cleansing: Ulcer Cleansing: Ulcer Cleansing: Rinsed/Irrigated with Saline Rinsed/Irrigated with Saline Rinsed/Irrigated with Saline Topical Anesthetic Applied: Topical Anesthetic Applied: Topical Anesthetic Applied: Other: lidocaine 4% Other: lidocaine 4% None Treatment Notes Electronic Signature(s) Signed: 04/26/2018 5:03:58 PM By: Elliot Gurney, BSN, RN, CWS, Kim RN, BSN Entered By: Elliot Gurney, BSN, RN, CWS, Kim on 04/26/2018 15:35:06 [name] ([id number]) -------------------------------------------------------------------------------- Multi-Disciplinary Care Plan Details Patient Name: [name] Date of Service: 04/26/2018 2:45 PM Medical Record Number: [id number] Patient Account Number: [id number] Date of Birth/Sex: 1956-02-06 (62 y.o. M) Treating RN: Huel Coventry Primary Care Micael Barb: Franco Nones Other Clinician: Referring Ival Pacer: Franco Nones Treating Nazyia Gaugh/Extender: Linwood Dibbles, HOYT Weeks in Treatment: 16 Active Inactive ` Abuse / Safety / Falls / Self Care Management Nursing Diagnoses: Impaired physical mobility Goals: Patient will not develop complications from immobility Date Initiated: 01/02/2018 Target Resolution Date: 02/16/2018 Goal Status:  Active Interventions: Assess fall risk on admission and as needed Notes: ` Nutrition Nursing Diagnoses: Potential for alteratiion in Nutrition/Potential for imbalanced nutrition Goals: Patient/caregiver agrees to and verbalizes understanding of need to use nutritional supplements and/or vitamins as prescribed Date Initiated: 01/02/2018 Target Resolution Date: 03/17/2018 Goal Status: Active Interventions: Provide education on nutrition Notes: ` Orientation to the Wound Care Program Nursing Diagnoses: Knowledge deficit related to the wound healing center program Goals: Patient/caregiver will verbalize understanding of the Wound Healing Center Program Date Initiated: 01/02/2018 Target Resolution Date: 02/16/2018 Goal Status: Active Interventions: [name], [name] ([id number]) Provide education on orientation to the wound center Notes: ` Pressure Nursing Diagnoses: Potential for impaired tissue integrity related to pressure, friction, moisture, and shear Goals: Patient will remain free of pressure ulcers Date Initiated: 01/02/2018 Target Resolution Date: 03/17/2018 Goal Status: Active Interventions: Assess potential for pressure ulcer upon admission and as needed Notes: ` Wound/Skin Impairment Nursing Diagnoses: Impaired tissue integrity Goals: Ulcer/skin breakdown will heal within 14 weeks Date Initiated: 01/02/2018 Target Resolution Date: 03/16/2018 Goal Status: Active Interventions: Assess patient/caregiver ability to obtain necessary supplies Assess patient/caregiver ability to perform ulcer/skin care regimen upon admission and as needed Assess ulceration(s) every visit Notes: Electronic Signature(s) Signed: 04/26/2018 5:03:58 PM By: Elliot Gurney, BSN, RN,  CWS, Kim RN, BSN Entered By: Elliot Gurney, BSN, RN, CWS, Kim on 04/26/2018 15:34:59 [name] ([id number]) -------------------------------------------------------------------------------- Pain Assessment Details Patient Name:  [name] Date of Service: 04/26/2018 2:45 PM Medical Record Number: [id number] Patient Account Number: [id number] Date of Birth/Sex: January 20, 1956 (62 y.o. M) Treating RN: Huel Coventry Primary Care Kendra Grissett: Franco Nones Other Clinician: Referring Issabela Lesko: Franco Nones Treating Malachai Schalk/Extender: Linwood Dibbles, HOYT Weeks in Treatment: 16 Active Problems Location of Pain Severity and Description of Pain Patient Has Paino No Site Locations Pain Management and Medication Current Pain Management: Electronic Signature(s) Signed: 04/26/2018 4:34:18 PM By: Dayton Martes RCP, RRT, CHT Signed: 04/26/2018 5:03:58 PM By: Elliot Gurney, BSN, RN, CWS, Kim RN, BSN Entered By: Dayton Martes on 04/26/2018 15:06:39 [name] ([id number]) -------------------------------------------------------------------------------- Patient/Caregiver Education Details Patient Name: [name] Date of Service: 04/26/2018 2:45 PM Medical Record Number: [id number] Patient Account Number: [id number] Date of Birth/Gender: August 27, 1955 (62 y.o. M) Treating RN: Huel Coventry Primary Care Physician: Franco Nones Other Clinician: Referring Physician: Franco Nones Treating Physician/Extender: Skeet Simmer in Treatment: 16 Education Assessment Education Provided To: Patient Education Topics Provided Pressure: Handouts: Pressure Ulcers: Care and Offloading Methods: Demonstration, Explain/Verbal Responses: State content correctly Wound/Skin Impairment: Handouts: Caring for Your Ulcer Methods: Demonstration Responses: State content correctly Electronic Signature(s) Signed: 04/26/2018 5:03:58 PM By: Elliot Gurney, BSN, RN, CWS, Kim RN, BSN Entered By: Elliot Gurney, BSN, RN, CWS, Kim on 04/26/2018 16:24:59 [name] ([id number]) -------------------------------------------------------------------------------- Wound Assessment Details Patient Name: [name] Date of Service:  04/26/2018 2:45 PM Medical Record Number: [id number] Patient Account Number: [id number] Date of Birth/Sex: 1956/05/12 (62 y.o. M) Treating RN: Rema Jasmine Primary Care Coline Calkin: Franco Nones Other Clinician: Referring Leonidas Boateng: Franco Nones Treating Arlina Sabina/Extender: STONE III, HOYT Weeks in Treatment: 16 Wound Status Wound Number: 1R Primary Pressure Ulcer Etiology: Wound Location: Right Gluteus Wound Open Wounding Event: Pressure Injury Status: Date Acquired: 12/11/2017 Comorbid Chronic sinus problems/congestion, Anemia, Weeks Of Treatment: 16 History: Arrhythmia, Hepatitis C, Type II Diabetes Clustered Wound: No Photos Photo Uploaded By: Rema Jasmine on 04/26/2018 15:59:44 Wound Measurements Length: (cm) 1 % Reductio Width: (cm) 0.5 % Reductio Depth: (cm) 0.1 Epithelial Area: (cm) 0.393 Tunneling Volume: (cm) 0.039 Undermini n in Area: 88.1% n in Volume: 88.2% ization: None : No ng: No Wound Description Classification: Category/Stage III Foul Odor Wound Margin: Flat and Intact Slough/Fi Exudate Amount: Small Exudate Type: Serosanguineous Exudate Color: red, brown After Cleansing: No brino No Wound Bed Granulation Amount: Medium (34-66%) Exposed Structure Granulation Quality: Red Fascia Exposed: No Necrotic Amount: Small (1-33%) Fat Layer (Subcutaneous Tissue) Exposed: Yes Necrotic Quality: Adherent Slough Tendon Exposed: No Muscle Exposed: No Joint Exposed: No Bone Exposed: No Periwound Skin Texture [name], [name] ([id number]) Texture Color No Abnormalities Noted: No No Abnormalities Noted: No Callus: No Erythema: No Crepitus: No Rubor: Yes Excoriation: No Temperature / Pain Induration: No Temperature: No Abnormality Rash: No Tenderness on Palpation: Yes Moisture No Abnormalities Noted: No Dry / Scaly: No Maceration: No Wound Preparation Ulcer Cleansing: Rinsed/Irrigated with Saline Topical Anesthetic Applied: Other: lidocaine  4%, Treatment Notes Wound #1R (Right Gluteus) 1. Cleansed with: Clean wound with Normal Saline 2. Anesthetic Topical Lidocaine 4% cream to wound bed prior to debridement 4. Dressing Applied: Hydrafera Blue 5. Secondary Dressing Applied Bordered Foam Dressing Electronic Signature(s) Signed: 04/26/2018 4:02:04 PM By: Rema Jasmine Entered By: Rema Jasmine on 04/26/2018 15:18:03 [name] ([id number]) -------------------------------------------------------------------------------- Wound Assessment Details Patient Name: [name] Date of Service: 04/26/2018 2:45 PM Medical Record Number: [id number] Patient Account Number:  [id number] Date of Birth/Sex: 28-Aug-1955 (62 y.o. M) Treating RN: Rema Jasmine Primary Care Laysa Kimmey: Franco Nones Other Clinician: Referring Kayna Suppa: Franco Nones Treating Breshay Ilg/Extender: STONE III, HOYT Weeks in Treatment: 16 Wound Status Wound Number: 3 Primary Pressure Ulcer Etiology: Wound Location: Right Ischium Wound Open Wounding Event: Pressure Injury Status: Date Acquired: 12/15/2017 Comorbid Chronic sinus problems/congestion, Anemia, Weeks Of Treatment: 16 History: Arrhythmia, Hepatitis C, Type II Diabetes Clustered Wound: No Photos Photo Uploaded By: Rema Jasmine on 04/26/2018 15:59:45 Wound Measurements Length: (cm) 1.4 % Reductio Width: (cm) 1.4 % Reductio Depth: (cm) 0.1 Epithelial Area: (cm) 1.539 Tunneling Volume: (cm) 0.154 Undermini n in Area: 79.6% n in Volume: 79.6% ization: None : No ng: No Wound Description Classification: Category/Stage III Foul Odor Wound Margin: Flat and Intact Slough/Fi Exudate Amount: Large Exudate Type: Serous Exudate Color: amber After Cleansing: No brino No Wound Bed Granulation Amount: Large (67-100%) Exposed Structure Granulation Quality: Red Fascia Exposed: No Necrotic Amount: Small (1-33%) Fat Layer (Subcutaneous Tissue) Exposed: Yes Necrotic Quality: Adherent Slough Tendon  Exposed: No Muscle Exposed: No Joint Exposed: No Bone Exposed: No Periwound Skin Texture Higgins, Shawn ([id number]) Texture Color No Abnormalities Noted: No No Abnormalities Noted: No Callus: No Atrophie Blanche: No Crepitus: No Cyanosis: No Excoriation: No Ecchymosis: No Induration: No Erythema: No Rash: No Hemosiderin Staining: No Scarring: No Mottled: No Pallor: No Moisture Rubor: Yes No Abnormalities Noted: No Dry / Scaly: No Maceration: No Wound Preparation Ulcer Cleansing: Rinsed/Irrigated with Saline Topical Anesthetic Applied: Other: lidocaine 4%, Treatment Notes Wound #3 (Right Ischium) 1. Cleansed with: Clean wound with Normal Saline 2. Anesthetic Topical Lidocaine 4% cream to wound bed prior to debridement 4. Dressing Applied: Hydrafera Blue 5. Secondary Dressing Applied Bordered Foam Dressing Electronic Signature(s) Signed: 04/26/2018 4:02:04 PM By: Rema Jasmine Entered By: Rema Jasmine on 04/26/2018 15:19:43 [name] ([id number]) -------------------------------------------------------------------------------- Wound Assessment Details Patient Name: [name] Date of Service: 04/26/2018 2:45 PM Medical Record Number: [id number] Patient Account Number: [id number] Date of Birth/Sex: 1955-09-11 (62 y.o. M) Treating RN: Huel Coventry Primary Care Jonell Brumbaugh: Franco Nones Other Clinician: Referring Trevionne Advani: Franco Nones Treating Sharief Wainwright/Extender: STONE III, HOYT Weeks in Treatment: 16 Wound Status Wound Number: 6 Primary Pressure Ulcer Etiology: Wound Location: Right, Distal Ischium Wound Healed - Epithelialized Wounding Event: Shear/Friction Status: Date Acquired: 04/06/2018 Comorbid Chronic sinus problems/congestion, Anemia, Weeks Of Treatment: 2 History: Arrhythmia, Hepatitis C, Type II Diabetes Clustered Wound: No Photos Photo Uploaded By: Rema Jasmine on 04/26/2018 16:00:32 Wound Measurements Length: (cm) 0 % Redu Width: (cm) 0 %  Redu Depth: (cm) 0 Epithe Area: (cm) 0 Tunne Volume: (cm) 0 Under ction in Area: 100% ction in Volume: 100% lialization: None ling: No mining: No Wound Description Classification: Category/Stage II Wound Margin: Flat and Intact Exudate Amount: None Present Foul Odor After Cleansing: No Slough/Fibrino Yes Wound Bed Granulation Amount: None Present (0%) Exposed Structure Necrotic Amount: None Present (0%) Fat Layer (Subcutaneous Tissue) Exposed: Yes Periwound Skin Texture Texture Color No Abnormalities Noted: No No Abnormalities Noted: No Callus: No Atrophie Blanche: No Crepitus: No Cyanosis: No Excoriation: No Ecchymosis: No Induration: No Erythema: No Rash: No Hemosiderin Staining: No [name], [name] ([id number]) Scarring: No Mottled: No Pallor: No Moisture Rubor: Yes No Abnormalities Noted: No Dry / Scaly: No Temperature / Pain Maceration: No Temperature: No Abnormality Tenderness on Palpation: Yes Wound Preparation Ulcer Cleansing: Rinsed/Irrigated with Saline Topical Anesthetic Applied: None Electronic Signature(s) Signed: 04/26/2018 5:03:58 PM By: Elliot Gurney, BSN, RN, CWS, Kim RN, BSN Entered By: Elliot Gurney, BSN, RN, CWS,  Kim on 04/26/2018 15:34:25 [name], [name] ([id number]) -------------------------------------------------------------------------------- Vitals Details Patient Name: [name] Date of Service: 04/26/2018 2:45 PM Medical Record Number: [id number] Patient Account Number: [id number] Date of Birth/Sex: 07-Feb-1956 (62 y.o. M) Treating RN: Huel Coventry Primary Care Sebastiano Luecke: Franco Nones Other Clinician: Referring Juanluis Guastella: Franco Nones Treating Jacarri Gesner/Extender: STONE III, HOYT Weeks in Treatment: 16 Vital Signs Time Taken: 15:03 Temperature (F): 98.1 Height (in): 70 Pulse (bpm): 72 Weight (lbs): 155 Respiratory Rate (breaths/min): 16 Body Mass Index (BMI): 22.2 Blood Pressure (mmHg): 136/81 Reference Range: 80 - 120 mg /  dl Electronic Signature(s) Signed: 04/26/2018 4:34:18 PM By: Dayton Martes RCP, RRT, CHT Entered By: Dayton Martes on 04/26/2018 15:07:37

## 2018-04-28 NOTE — Progress Notes (Signed)
GREY, RAKESTRAW ([id number]) Visit Report for 04/26/2018 Chief Complaint Document Details Patient Name: [name], [name] Date of Service: 04/26/2018 2:45 PM Medical Record Number: [id number] Patient Account Number: [id number] Date of Birth/Sex: Nov 27, 1955 (62 y.o. M) Treating RN: Huel Coventry Primary Care Provider: Franco Nones Other Clinician: Referring Provider: Franco Nones Treating Provider/Extender: Linwood Dibbles, Pearlean Sabina Weeks in Treatment: 16 Information Obtained from: Patient Chief Complaint Bilateral Gluteal Ulcers Electronic Signature(s) Signed: 04/26/2018 5:08:41 PM By: Lenda Kelp PA-C Entered By: Lenda Kelp on 04/26/2018 14:58:38 [name] ([id number]) -------------------------------------------------------------------------------- HPI Details Patient Name: [name] Date of Service: 04/26/2018 2:45 PM Medical Record Number: [id number] Patient Account Number: [id number] Date of Birth/Sex: [birth year] (62 y.o. M) Treating RN: Huel Coventry Primary Care Provider: Franco Nones Other Clinician: Referring Provider: Franco Nones Treating Provider/Extender: Linwood Dibbles, Harlem Thresher Weeks in Treatment: 16 History of Present Illness HPI Description: 09/14/17-he is here for evaluation of the left lower extremity injury. He is a resident of a group home and is accompanied by facility staff. He went to his primary care on 2/12 for a left lower extremity wound. At that appointment a wound culture was taken. A referral for wound care services was ordered on 2/26 and according to facility med rec he was started on Bactrim on 3/5. The patient and facility staff are very poor historians. It is assumed that the injury to the left leg was from scratching. He presents today with no open area, no erythema, no evidence of drainage. He is going to PCP later today for follow up. Readmission: 01/02/18 on evaluation today patient presents for reevaluation in the clinic although this is for  a separate issue. He actually has several states to the pressure ulcers along his bilateral gluteal region which apparently have been present for several weeks now. He did see his primary care provider two weeks ago and they did initiate treatment. Home health was finally obtained although they were having a difficult time getting home health. It sounds like doing is actually used for the wound beds obviously don't think this is gonna be the best treatment option for him at this point. Fortunately there does not appear to be any evidence of infection at this time the patient does have discomfort but not as much as he apparently had in the past. When he was originally dealing with these before going to see his primary care provider the pain was much more significant. He does have a foam cushion for his wheelchair although I think being that he is wheelchair dependent he may benefit from a Roho cushion at this point. He also may benefit from an air mattress in my pinion he does have a hospital bed but again between the bed and his wheelchair he pretty much spends all of his time during the day and one of these two locations. I do think he staying too long in the wheelchair as far as sitting up as well which also I think needs to be addressed I did have a long conversation with him today concerning the fact that he needs to rotate positions and locations at least every two hours. This is something we're going to put on the notes as well to sin with him to the facility. I do think that this is going to be of utmost importance the dressings can be helpful but they are not gonna be able to manage this completely alone. He does have some hyper granular tissue noted. 01/09/18 on evaluation today patient presents for reevaluation concerning  multiple pressure ulcers noted of the bilateral gluteal region. He has been tolerating the dressing changes without complication the good news is he seems to be making  great progress at this point in time. There does not appear to be any evidence of infection at this time. No fevers, chills, nausea, or vomiting noted at this time. 01/23/18 Patient's wounds seem to show signs of good improvement at this point in time. He has been tolerating the dressing changes without complication. With that being said I do believe that he has been making excellent progress over the past several weeks. No fevers, chills, nausea, or vomiting noted at this time. 02/06/18 on evaluation today patient appears to be doing much better in regard to his gluteal and Ischial ulcers. He's been tolerating the dressing changes without complication the Hydrofera Blue Dressing be doing excellent for him. Overall I'm very pleased in this regard. He's having no significant pain. 03/07/18 on evaluation today patient appears to be doing much better in regard to his ulcers in the sacral, gluteal, Ischial locations. Overall in fact he only has two wounds remaining that appear to be open. This is the right gluteus and right ischial locations. Other than this everything that I have been managing up to this point appears greatly improved. Overall the patient states he's having minimal pain. He does have a foam cushion for his wheelchair. 03/22/18 on evaluation today patient actually appears to be doing much better in regard to his gluteal/Ischial wounds. In fact he just has one area remaining at this point in time in the right gluteal region. Fortunately he has made excellent progress at this point. [name], [name] ([id number]) 04/10/18 on evaluation today patient actually appears to be doing worse compared to his previous evaluation. He tells me unfortunately that he has been doing a lot more sitting and getting in the bed last. I think this has directly led to a breakdown in the overall status of his wounds as well is pressure injury surrounding the wounds in the gluteal/sacral region in general. Obviously  this is not good and definitely something that we have been wanting to try to avoid. Subsequently he also tells me the home health is not been out changes dressings according to the patient since last Wednesday. 04/26/18 on evaluation today patient actually appears to be doing better in regard to his gluteal and Ischial wounds. He has been tolerating the dressing changes without complication. With that being said I do feel like he has been staying off of his bottom more at this point. It also does appear that he's in the process of potentially getting a new electric wheelchair and they are also looking for a potential better cushion for him as far as pressure relief is concerned at this point. Nonetheless I do believe that the patient has made excellent progress up to this point in the past two weeks period of Time. Electronic Signature(s) Signed: 04/26/2018 5:08:41 PM By: Lenda Kelp PA-C Entered By: Lenda Kelp on 04/26/2018 15:44:50 [name] ([id number]) -------------------------------------------------------------------------------- Physical Exam Details Patient Name: [name] Date of Service: 04/26/2018 2:45 PM Medical Record Number: [id number] Patient Account Number: [id number] Date of Birth/Sex: [birth year] (62 y.o. M) Treating RN: Huel Coventry Primary Care Provider: Franco Nones Other Clinician: Referring Provider: Franco Nones Treating Provider/Extender: STONE III, Antoni Stefan Weeks in Treatment: 16 Constitutional Well-nourished and well-hydrated in no acute distress. Respiratory normal breathing without difficulty. clear to auscultation bilaterally. Cardiovascular regular rate and rhythm with normal S1, S2. Psychiatric  this patient is able to make decisions and demonstrates good insight into disease process. Alert and Oriented x 3. pleasant and cooperative. Notes Patient's wounds currently did not require any sharp debridement and appear to be doing very well  especially when compared to two weeks ago. Overall I think she's making good progress and he definitely has been better about staying off of it I can tell that just from the overall appearance of the wounds. No sharp debridement was necessary today. Electronic Signature(s) Signed: 04/26/2018 5:08:41 PM By: Lenda Kelp PA-C Entered By: Lenda Kelp on 04/26/2018 15:45:25 [name] ([id number]) -------------------------------------------------------------------------------- Physician Orders Details Patient Name: [name] Date of Service: 04/26/2018 2:45 PM Medical Record Number: [id number] Patient Account Number: [id number] Date of Birth/Sex: 11/03/1955 (62 y.o. 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 o Cleanse wound with mild soap and water o May Shower, gently pat wound dry prior to applying new dressing. Wound #3 Right Ischium o Clean wound with Normal Saline. o Cleanse wound with mild soap and water o May Shower, gently pat wound dry prior to applying new dressing. Anesthetic (add to Medication List) Wound #1R Right Gluteus o Topical Lidocaine 4% cream applied to wound bed prior to debridement (In Clinic Only). Wound #3 Right Ischium o Topical Lidocaine 4% cream applied to wound bed prior to debridement (In Clinic Only). Primary Wound Dressing Wound  #1R Right Gluteus o Hydrafera Blue Ready Transfer Wound #3 Right Ischium o Hydrafera Blue Ready Transfer Secondary Dressing Wound #1R Right Gluteus o Boardered Foam Dressing Wound #3 Right Ischium o Boardered Foam Dressing [name], [name] ([id number]) Dressing Change Frequency Wound #1R Right Gluteus o Change Dressing Monday, Wednesday, Friday - an as needed.Marland KitchenMarland KitchenHHRN to change the dressing for patient 3 times weekly because patient cannot change the dressing himself. Wound #3 Right Ischium o Change Dressing Monday, Wednesday, Friday - an as needed.Marland KitchenMarland KitchenHHRN to change the dressing for patient 3 times weekly because patient cannot change the dressing himself. Follow-up Appointments Wound #1R Right Gluteus o Return Appointment in 2 weeks. Wound #3 Right Ischium o Return Appointment in 2 weeks. Off-Loading Wound #1R Right Gluteus o Turn and reposition every 2 hours Wound #3 Right Ischium o Turn and reposition every 2 hours Additional Orders / Instructions Wound #1R Right Gluteus o Increase protein intake. o Activity as tolerated Wound #3 Right Ischium o Increase protein intake. o Activity as tolerated Home Health Wound #1R Right Gluteus o Continue Home Health Visits - [facility] - [facility] to change the dressing for patient 3 times weekly because patient cannot change the dressing himself. o Home Health Nurse may visit PRN to address patientos wound care needs. o FACE TO FACE ENCOUNTER: MEDICARE and MEDICAID PATIENTS: I certify that this patient is under my care and that I had a face-to-face encounter that meets the physician face-to-face encounter requirements with this patient on this date. The encounter with the patient was in whole or in part for the following MEDICAL CONDITION: (primary reason for Home Healthcare) MEDICAL  NECESSITY: I certify, that based on my findings, NURSING services are a medically necessary home health service. HOME BOUND  STATUS: I certify that my clinical findings support that this patient is homebound (i.e., Due to illness or injury, pt requires aid of supportive devices such as crutches, cane, wheelchairs, walkers, the use of special transportation or the assistance of another person to leave their place of residence. There is a normal inability to leave the home and doing so requires considerable and taxing effort. Other absences are for medical reasons / religious services and are infrequent or of short duration when for other reasons). o If current dressing causes regression in wound condition, may D/C ordered dressing product/s and apply Normal Saline Moist Dressing daily until next Wound Healing Center / Other MD appointment. Notify Wound Healing Center of regression in wound condition at [phone]. o Please direct any NON-WOUND related issues/requests for orders to patient's Primary Care Physician Wound #3 Right Ischium o Continue Home Health Visits - [facility] - [facility] to change the dressing for patient 3 times weekly because patient cannot change the dressing himself. SWADE, SHONKA ([id number]) o Home Health Nurse may visit PRN to address patientos wound care needs. o FACE TO FACE ENCOUNTER: MEDICARE and MEDICAID PATIENTS: I certify that this patient is under my care and that I had a face-to-face encounter that meets the physician face-to-face encounter requirements with this patient on this date. The encounter with the patient was in whole or in part for the following MEDICAL CONDITION: (primary reason for Home Healthcare) MEDICAL NECESSITY: I certify, that based on my findings, NURSING services are a medically necessary home health service. HOME BOUND STATUS: I certify that my clinical findings support that this patient is homebound (i.e., Due to illness or injury, pt requires aid of supportive devices such as crutches, cane, wheelchairs, walkers, the use of special transportation or  the assistance of another person to leave their place of residence. There is a normal inability to leave the home and doing so requires considerable and taxing effort. Other absences are for medical reasons / religious services and are infrequent or of short duration when for other reasons). o If current dressing causes regression in wound condition, may D/C ordered dressing product/s and apply Normal Saline Moist Dressing daily until next Wound Healing Center / Other MD appointment. Notify Wound Healing Center of regression in wound condition at [phone]. o Please direct any NON-WOUND related issues/requests for orders to patient's Primary Care Physician Electronic Signature(s) Signed: 04/26/2018 5:03:58 PM By: Elliot Gurney, BSN, RN, CWS, Kim RN, BSN Signed: 04/26/2018 5:08:41 PM By: Lenda Kelp PA-C Entered By: Elliot Gurney, BSN, RN, CWS, Kim on 04/26/2018 15:36:08 [name] ([id number]) -------------------------------------------------------------------------------- Problem List Details Patient Name: [name] Date of Service: 04/26/2018 2:45 PM Medical Record Number: [id number] Patient Account Number: [id number] Date of Birth/Sex: [birth year] (62 y.o. 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 01/02/2018 No Yes F17.210 Nicotine dependence, cigarettes, uncomplicated 01/02/2018 No Yes K70.30 Alcoholic cirrhosis of liver without ascites 01/02/2018 No Yes Inactive Problems Resolved  Problems Electronic Signature(s) Signed: 04/26/2018 5:08:41 PM  By: Lenda Kelp PA-C Entered By: Lenda Kelp on 04/26/2018 14:58:34 [name] ([id number]) -------------------------------------------------------------------------------- Progress Note Details Patient Name: [name] Date of Service: 04/26/2018 2:45 PM Medical Record Number: [id number] Patient Account Number: [id number] Date of Birth/Sex: June 30, 1956 (62 y.o. M) Treating RN: Huel Coventry Primary Care Provider: Franco Nones Other Clinician: Referring Provider: Franco Nones Treating Provider/Extender: Linwood Dibbles, Laverda Stribling Weeks in Treatment: 16 Subjective Chief Complaint Information obtained from Patient Bilateral Gluteal Ulcers History of Present Illness (HPI) 09/14/17-he is here for evaluation of the left lower extremity injury. He is a resident of a group home and is accompanied by facility staff. He went to his primary care on 2/12 for a left lower extremity wound. At that appointment a wound culture was taken. A referral for wound care services was ordered on 2/26 and according to facility med rec he was started on Bactrim on 3/5. The patient and facility staff are very poor historians. It is assumed that the injury to the left leg was from scratching. He presents today with no open area, no erythema, no evidence of drainage. He is going to PCP later today for follow up. Readmission: 01/02/18 on evaluation today patient presents for reevaluation in the clinic although this is for a separate issue. He actually has several states to the pressure ulcers along his bilateral gluteal region which apparently have been present for several weeks now. He did see his primary care provider two weeks ago and they did initiate treatment. Home health was finally obtained although they were having a difficult time getting home health. It sounds like doing is actually used for the wound beds obviously don't think this is  gonna be the best treatment option for him at this point. Fortunately there does not appear to be any evidence of infection at this time the patient does have discomfort but not as much as he apparently had in the past. When he was originally dealing with these before going to see his primary care provider the pain was much more significant. He does have a foam cushion for his wheelchair although I think being that he is wheelchair dependent he may benefit from a Roho cushion at this point. He also may benefit from an air mattress in my pinion he does have a hospital bed but again between the bed and his wheelchair he pretty much spends all of his time during the day and one of these two locations. I do think he staying too long in the wheelchair as far as sitting up as well which also I think needs to be addressed I did have a long conversation with him today concerning the fact that he needs to rotate positions and locations at least every two hours. This is something we're going to put on the notes as well to sin with him to the facility. I do think that this is going to be of utmost importance the dressings can be helpful but they are not gonna be able to manage this completely alone. He does have some hyper granular tissue noted. 01/09/18 on evaluation today patient presents for reevaluation concerning multiple pressure ulcers noted of the bilateral gluteal region. He has been tolerating the dressing changes without complication the good news is he seems to be making great progress at this point in time. There does not appear to be any evidence of infection at this time. No fevers, chills, nausea, or vomiting noted at this time. 01/23/18 Patient's wounds seem to show signs of  good improvement at this point in time. He has been tolerating the dressing changes without complication. With that being said I do believe that he has been making excellent progress over the past several weeks. No fevers,  chills, nausea, or vomiting noted at this time. 02/06/18 on evaluation today patient appears to be doing much better in regard to his gluteal and Ischial ulcers. He's been tolerating the dressing changes without complication the Hydrofera Blue Dressing be doing excellent for him. Overall I'm very pleased in this regard. He's having no significant pain. 03/07/18 on evaluation today patient appears to be doing much better in regard to his ulcers in the sacral, gluteal, Ischial locations. Overall in fact he only has two wounds remaining that appear to be open. This is the right gluteus and right ischial locations. Other than this everything that I have been managing up to this point appears greatly improved. Overall the patient states he's having minimal pain. He does have a foam cushion for his wheelchair. [name], [name] ([id number]) 03/22/18 on evaluation today patient actually appears to be doing much better in regard to his gluteal/Ischial wounds. In fact he just has one area remaining at this point in time in the right gluteal region. Fortunately he has made excellent progress at this point. 04/10/18 on evaluation today patient actually appears to be doing worse compared to his previous evaluation. He tells me unfortunately that he has been doing a lot more sitting and getting in the bed last. I think this has directly led to a breakdown in the overall status of his wounds as well is pressure injury surrounding the wounds in the gluteal/sacral region in general. Obviously this is not good and definitely something that we have been wanting to try to avoid. Subsequently he also tells me the home health is not been out changes dressings according to the patient since last Wednesday. 04/26/18 on evaluation today patient actually appears to be doing better in regard to his gluteal and Ischial wounds. He has been tolerating the dressing changes without complication. With that being said I do feel like he  has been staying off of his bottom more at this point. It also does appear that he's in the process of potentially getting a new electric wheelchair and they are also looking for a potential better cushion for him as far as pressure relief is concerned at this point. Nonetheless I do believe that the patient has made excellent progress up to this point in the past two weeks period of Time. Patient History Information obtained from Patient. Family History Heart Disease - Siblings, Hypertension - Siblings, Lung Disease - Siblings, No family history of Cancer, Diabetes, Hereditary Spherocytosis, Kidney Disease, Seizures, Stroke, Thyroid Problems, Tuberculosis. Social History Current every day smoker, Marital Status - Single, Alcohol Use - Never - history of ETOH abuse, Drug Use - Prior History, Caffeine Use - Daily. Medical And Surgical History Notes Gastrointestinal Colostomy Musculoskeletal R AKA Review of Systems (ROS) Constitutional Symptoms (General Health) Denies complaints or symptoms of Fever, Chills. Respiratory The patient has no complaints or symptoms. Cardiovascular The patient has no complaints or symptoms. Psychiatric The patient has no complaints or symptoms. Objective Constitutional Well-nourished and well-hydrated in no acute distress. [name], [name] ([id number]) Vitals Time Taken: 3:03 PM, Height: 70 in, Weight: 155 lbs, BMI: 22.2, Temperature: 98.1 F, Pulse: 72 bpm, Respiratory Rate: 16 breaths/min, Blood Pressure: 136/81 mmHg. Respiratory normal breathing without difficulty. clear to auscultation bilaterally. Cardiovascular regular rate and rhythm with normal S1,  S2. Psychiatric this patient is able to make decisions and demonstrates good insight into disease process. Alert and Oriented x 3. pleasant and cooperative. General Notes: Patient's wounds currently did not require any sharp debridement and appear to be doing very well especially when compared to  two weeks ago. Overall I think she's making good progress and he definitely has been better about staying off of it I can tell that just from the overall appearance of the wounds. No sharp debridement was necessary today. Integumentary (Hair, Skin) Wound #1R status is Open. Original cause of wound was Pressure Injury. The wound is located on the Right Gluteus. The wound measures 1cm length x 0.5cm width x 0.1cm depth; 0.393cm^2 area and 0.039cm^3 volume. There is Fat Layer (Subcutaneous Tissue) Exposed exposed. There is no tunneling or undermining noted. There is a small amount of serosanguineous drainage noted. The wound margin is flat and intact. There is medium (34-66%) red granulation within the wound bed. There is a small (1-33%) amount of necrotic tissue within the wound bed including Adherent Slough. The periwound skin appearance exhibited: Rubor. The periwound skin appearance did not exhibit: Callus, Crepitus, Excoriation, Induration, Rash, Dry/Scaly, Maceration, Erythema. Periwound temperature was noted as No Abnormality. The periwound has tenderness on palpation. Wound #3 status is Open. Original cause of wound was Pressure Injury. The wound is located on the Right Ischium. The wound measures 1.4cm length x 1.4cm width x 0.1cm depth; 1.539cm^2 area and 0.154cm^3 volume. There is Fat Layer (Subcutaneous Tissue) Exposed exposed. There is no tunneling or undermining noted. There is a large amount of serous drainage noted. The wound margin is flat and intact. There is large (67-100%) red granulation within the wound bed. There is a small (1-33%) amount of necrotic tissue within the wound bed including Adherent Slough. The periwound skin appearance exhibited: Rubor. The periwound skin appearance did not exhibit: Callus, Crepitus, Excoriation, Induration, Rash, Scarring, Dry/Scaly, Maceration, Atrophie Blanche, Cyanosis, Ecchymosis, Hemosiderin Staining, Mottled, Pallor, Erythema. Wound #6  status is Healed - Epithelialized. Original cause of wound was Shear/Friction. The wound is located on the Right,Distal Ischium. The wound measures 0cm length x 0cm width x 0cm depth; 0cm^2 area and 0cm^3 volume. There is Fat Layer (Subcutaneous Tissue) Exposed exposed. There is no tunneling or undermining noted. There is a none present amount of drainage noted. The wound margin is flat and intact. There is no granulation within the wound bed. There is no necrotic tissue within the wound bed. The periwound skin appearance exhibited: Rubor. The periwound skin appearance did not exhibit: Callus, Crepitus, Excoriation, Induration, Rash, Scarring, Dry/Scaly, Maceration, Atrophie Blanche, Cyanosis, Ecchymosis, Hemosiderin Staining, Mottled, Pallor, Erythema. Periwound temperature was noted as No Abnormality. The periwound has tenderness on palpation. 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 Cleanse wound with mild soap and water May Shower, gently pat wound dry prior to applying new dressing. Wound #3 Right Ischium: Clean wound with Normal Saline. Cleanse wound with mild soap and water May Shower, gently pat wound dry prior to applying new dressing. Anesthetic (add to Medication List): Wound #1R Right Gluteus: Topical Lidocaine 4% cream applied to wound bed prior to debridement (In Clinic Only). Wound #3 Right Ischium: Topical Lidocaine 4% cream applied  to wound bed prior to debridement (In Clinic Only). Primary Wound Dressing: Wound #1R Right Gluteus: Hydrafera Blue Ready Transfer Wound #3 Right Ischium: Hydrafera Blue Ready  Transfer Secondary Dressing: Wound #1R Right Gluteus: Boardered Foam Dressing Wound #3 Right Ischium: Boardered Foam Dressing Dressing Change Frequency: Wound #1R Right Gluteus: Change Dressing Monday, Wednesday, Friday - an as needed.Marland KitchenMarland KitchenHHRN to change the dressing for patient 3 times weekly because patient cannot change the dressing himself. Wound #3 Right Ischium: Change Dressing Monday, Wednesday, Friday - an as needed.Marland KitchenMarland KitchenHHRN to change the dressing for patient 3 times weekly because patient cannot change the dressing himself. Follow-up Appointments: Wound #1R Right Gluteus: Return Appointment in 2 weeks. Wound #3 Right Ischium: Return Appointment in 2 weeks. Off-Loading: Wound #1R Right Gluteus: Turn and reposition every 2 hours Wound #3 Right Ischium: Turn and reposition every 2 hours Additional Orders / Instructions: Wound #1R Right Gluteus: Increase protein intake. Activity as tolerated Wound #3 Right Ischium: Increase protein intake. Activity as tolerated Home Health: [name], [name] ([id number]) Wound #1R Right Gluteus: Continue Home Health Visits - [facility] - HHRN to change the dressing for patient 3 times weekly because patient cannot change the dressing himself. Home Health Nurse may visit PRN to address patient s wound care needs. FACE TO FACE ENCOUNTER: MEDICARE and MEDICAID PATIENTS: I certify that this patient is under my care and that I had a face-to-face encounter that meets the physician face-to-face encounter requirements with this patient on this date. The encounter with the patient was in whole or in part for the following MEDICAL CONDITION: (primary reason for Home Healthcare) MEDICAL NECESSITY: I certify, that based on my findings, NURSING services are a medically necessary home health service. HOME BOUND STATUS: I certify that my clinical findings support that this patient is homebound (i.e., Due to illness or injury, pt requires aid of supportive  devices such as crutches, cane, wheelchairs, walkers, the use of special transportation or the assistance of another person to leave their place of residence. There is a normal inability to leave the home and doing so requires considerable and taxing effort. Other absences are for medical reasons / religious services and are infrequent or of short duration when for other reasons). If current dressing causes regression in wound condition, may D/C ordered dressing product/s and apply Normal Saline Moist Dressing daily until next Wound Healing Center / Other MD appointment. Notify Wound Healing Center of regression in wound condition at [phone]. Please direct any NON-WOUND related issues/requests for orders to patient's Primary Care Physician Wound #3 Right Ischium: Continue Home Health Visits - [facility] - HHRN to change the dressing for patient 3 times weekly because patient cannot change the dressing himself. Home Health Nurse may visit PRN to address patient s wound care needs. FACE TO FACE ENCOUNTER: MEDICARE and MEDICAID PATIENTS: I certify that this patient is under my care and that I had a face-to-face encounter that meets the physician face-to-face encounter requirements with this patient on this date. The encounter with the patient was in whole or in part for the following MEDICAL CONDITION: (primary reason for Home Healthcare) MEDICAL NECESSITY: I certify, that based on my findings, NURSING services are a medically necessary home health service. HOME BOUND STATUS: I certify that my clinical findings support that this patient is homebound (i.e., Due to illness or injury, pt requires aid of supportive devices such as crutches, cane, wheelchairs, walkers, the use of special transportation or the assistance of another person to leave their place of  residence. There is a normal inability to leave the home and doing so requires considerable and taxing effort. Other absences are for medical  reasons / religious services and are infrequent or of short duration when for other reasons). If current dressing causes regression in wound condition, may D/C ordered dressing product/s and apply Normal Saline Moist Dressing daily until next Wound Healing Center / Other MD appointment. Notify Wound Healing Center of regression in wound condition at [phone]. Please direct any NON-WOUND related issues/requests for orders to patient's Primary Care Physician I'm gonna suggest currently that we continue with the above wound care measures for the next two weeks. We will see him back for reevaluation at that time. If anything changes or worsens meantime patient will contact our office and let me know. Please see above for specific wound care orders. We will see patient for re-evaluation in 2 week(s) here in the clinic. If anything worsens or changes patient will contact our office for additional recommendations. Electronic Signature(s) Signed: 04/26/2018 5:08:41 PM By: Lenda Kelp PA-C Entered By: Lenda Kelp on 04/26/2018 15:45:53 [name] ([id number]) -------------------------------------------------------------------------------- ROS/PFSH Details Patient Name: [name] Date of Service: 04/26/2018 2:45 PM Medical Record Number: [id number] Patient Account Number: [id number] Date of Birth/Sex: June 27, 1956 (62 y.o. M) Treating RN: Huel Coventry Primary Care Provider: Franco Nones Other Clinician: Referring Provider: Franco Nones Treating Provider/Extender: Linwood Dibbles, Saron Tweed Weeks in Treatment: 16 Information Obtained From Patient Wound History Do you currently have one or more open woundso Yes Approximately how long have you had your woundso 2 weeks How have you been treating your wound(s) until nowo Deuoderm Has your wound(s) ever healed and then re-openedo No Have you had any lab work done in the past montho No Have you tested positive for an antibiotic resistant  organism (MRSA, VRE)o No Have you tested positive for osteomyelitis (bone infection)o No Have you had any tests for circulation on your legso No Constitutional Symptoms (General Health) Complaints and Symptoms: Negative for: Fever; Chills Eyes Medical History: Negative for: Cataracts; Glaucoma; Optic Neuritis Ear/Nose/Mouth/Throat Medical History: Positive for: Chronic sinus problems/congestion Negative for: Middle ear problems Hematologic/Lymphatic Medical History: Positive for: Anemia Negative for: Hemophilia; Human Immunodeficiency Virus; Lymphedema; Sickle Cell Disease Respiratory Complaints and Symptoms: No Complaints or Symptoms Medical History: Negative for: Aspiration; Asthma; Chronic Obstructive Pulmonary Disease (COPD); Pneumothorax; Sleep Apnea; Tuberculosis Cardiovascular Complaints and Symptoms: No Complaints or Symptoms Medical History: Positive for: Arrhythmia [name], [name] ([id number]) Negative for: Angina; Congestive Heart Failure; Coronary Artery Disease; Deep Vein Thrombosis; Hypertension; Hypotension; Myocardial Infarction; Peripheral Arterial Disease; Peripheral Venous Disease; Phlebitis; Vasculitis Gastrointestinal Medical History: Positive for: Hepatitis C Negative for: Cirrhosis ; Colitis; Crohnos; Hepatitis A; Hepatitis B Past Medical History Notes: Colostomy Endocrine Medical History: Positive for: Type II Diabetes Time with diabetes: 1 year Treated with: Insulin Blood sugar tested every day: Yes Tested : Genitourinary Medical History: Negative for: End Stage Renal Disease Immunological Medical History: Negative for: Lupus Erythematosus; Raynaudos; Scleroderma Musculoskeletal Medical History: Past Medical History Notes: R AKA Neurologic Medical History: Negative for: Dementia; Neuropathy; Quadriplegia; Paraplegia; Seizure Disorder Psychiatric Complaints and Symptoms: No Complaints or Symptoms HBO Extended History  Items Ear/Nose/Mouth/Throat: Chronic sinus problems/congestion Immunizations Pneumococcal Vaccine: Received Pneumococcal Vaccination: Yes Immunization Notes: up to date Implantable Devices Family and Social History [name], [name] ([id number]) Cancer: No; Diabetes: No; Heart Disease: Yes - Siblings; Hereditary Spherocytosis: No; Hypertension: Yes - Siblings; Kidney Disease: No; Lung Disease: Yes - Siblings; Seizures: No; Stroke: No; Thyroid Problems: No; Tuberculosis: No;  Current every day smoker; Marital Status - Single; Alcohol Use: Never - history of ETOH abuse; Drug Use: Prior History; Caffeine Use: Daily; Financial Concerns: No; Food, Clothing or Shelter Needs: No; Support System Lacking: No; Transportation Concerns: No; Advanced Directives: No; Patient does not want information on Advanced Directives Physician Affirmation I have reviewed and agree with the above information. Electronic Signature(s) Signed: 04/26/2018 5:03:58 PM By: Elliot Gurney, BSN, RN, CWS, Kim RN, BSN Signed: 04/26/2018 5:08:41 PM By: Lenda Kelp PA-C Entered By: Lenda Kelp on 04/26/2018 15:45:06 [name] ([id number]) -------------------------------------------------------------------------------- SuperBill Details Patient Name: [name] Date of Service: 04/26/2018 Medical Record Number: [id number] Patient Account Number: [id number] Date of Birth/Sex: Nov 15, 1955 (62 y.o. M) Treating RN: Huel Coventry Primary Care Provider: Franco Nones Other Clinician: Referring Provider: Franco Nones Treating Provider/Extender: Linwood Dibbles, Tishanna Dunford Weeks in Treatment: 16 Diagnosis Coding ICD-10 Codes Code Description L89.313 Pressure ulcer of right buttock, stage 3 L89.323 Pressure ulcer of left buttock, stage 3 Z99.3 Dependence on wheelchair Z93.3 Colostomy status Z89.611 Acquired absence of right leg above knee F17.210 Nicotine dependence, cigarettes, uncomplicated K70.30 Alcoholic cirrhosis of liver  without ascites Facility Procedures CPT4 Code: [id number] Description: 99214 - WOUND CARE VISIT-LEV 4 EST PT Modifier: Quantity: 1 Physician Procedures CPT4 Code: [id number] Description: 99213 - WC PHYS LEVEL 3 - EST PT ICD-10 Diagnosis Description L89.313 Pressure ulcer of right buttock, stage 3 L89.323 Pressure ulcer of left buttock, stage 3 Z99.3 Dependence on wheelchair Z93.3 Colostomy status Modifier: Quantity: 1 Electronic Signature(s) Signed: 04/27/2018 4:46:39 PM By: Francie Massing Previous Signature: 04/26/2018 5:08:41 PM Version By: Lenda Kelp PA-C Entered By: Francie Massing on 04/27/2018 16:46:38

## 2018-05-08 ENCOUNTER — Encounter: Payer: Medicaid Other | Admitting: Physician Assistant

## 2018-05-08 DIAGNOSIS — E11622 Type 2 diabetes mellitus with other skin ulcer: Secondary | ICD-10-CM | POA: Diagnosis not present

## 2018-05-11 NOTE — Progress Notes (Signed)
DEMON, VOLANTE ([id number]) Visit Report for 05/08/2018 Chief Complaint Document Details Patient Name: [name], [name] Date of Service: 05/08/2018 2:45 PM Medical Record Number: [id number] Patient Account Number: [id number] Date of Birth/Sex: 1955/12/01 (62 y.o. M) Treating RN: Curtis Sites Primary Care Provider: Franco Nones Other Clinician: Referring Provider: Franco Nones Treating Provider/Extender: Linwood Dibbles, Demere Dotzler Weeks in Treatment: 18 Information Obtained from: Patient Chief Complaint Bilateral Gluteal Ulcers Electronic Signature(s) Signed: 05/09/2018 1:21:52 AM By: Lenda Kelp PA-C Entered By: Lenda Kelp on 05/08/2018 15:10:06 [name] ([id number]) -------------------------------------------------------------------------------- HPI Details Patient Name: [name] Date of Service: 05/08/2018 2:45 PM Medical Record Number: [id number] Patient Account Number: [id number] Date of Birth/Sex: 14-Nov-1955 (62 y.o. M) Treating RN: Curtis Sites Primary Care Provider: Franco Nones Other Clinician: Referring Provider: Franco Nones Treating Provider/Extender: Linwood Dibbles, Koen Antilla Weeks in Treatment: 18 History of Present Illness HPI Description: 09/14/17-he is here for evaluation of the left lower extremity injury. He is a resident of a group home and is accompanied by facility staff. He went to his primary care on 2/12 for a left lower extremity wound. At that appointment a wound culture was taken. A referral for wound care services was ordered on 2/26 and according to facility med rec he was started on Bactrim on 3/5. The patient and facility staff are very poor historians. It is assumed that the injury to the left leg was from scratching. He presents today with no open area, no erythema, no evidence of drainage. He is going to PCP later today for follow up. Readmission: 01/02/18 on evaluation today patient presents for reevaluation in the clinic although this  is for a separate issue. He actually has several states to the pressure ulcers along his bilateral gluteal region which apparently have been present for several weeks now. He did see his primary care provider two weeks ago and they did initiate treatment. Home health was finally obtained although they were having a difficult time getting home health. It sounds like doing is actually used for the wound beds obviously don't think this is gonna be the best treatment option for him at this point. Fortunately there does not appear to be any evidence of infection at this time the patient does have discomfort but not as much as he apparently had in the past. When he was originally dealing with these before going to see his primary care provider the pain was much more significant. He does have a foam cushion for his wheelchair although I think being that he is wheelchair dependent he may benefit from a Roho cushion at this point. He also may benefit from an air mattress in my pinion he does have a hospital bed but again between the bed and his wheelchair he pretty much spends all of his time during the day and one of these two locations. I do think he staying too long in the wheelchair as far as sitting up as well which also I think needs to be addressed I did have a long conversation with him today concerning the fact that he needs to rotate positions and locations at least every two hours. This is something we're going to put on the notes as well to sin with him to the facility. I do think that this is going to be of utmost importance the dressings can be helpful but they are not gonna be able to manage this completely alone. He does have some hyper granular tissue noted. 01/09/18 on evaluation today patient presents for reevaluation concerning  multiple pressure ulcers noted of the bilateral gluteal region. He has been tolerating the dressing changes without complication the good news is he seems to be  making great progress at this point in time. There does not appear to be any evidence of infection at this time. No fevers, chills, nausea, or vomiting noted at this time. 01/23/18 Patient's wounds seem to show signs of good improvement at this point in time. He has been tolerating the dressing changes without complication. With that being said I do believe that he has been making excellent progress over the past several weeks. No fevers, chills, nausea, or vomiting noted at this time. 02/06/18 on evaluation today patient appears to be doing much better in regard to his gluteal and Ischial ulcers. He's been tolerating the dressing changes without complication the Hydrofera Blue Dressing be doing excellent for him. Overall I'm very pleased in this regard. He's having no significant pain. 03/07/18 on evaluation today patient appears to be doing much better in regard to his ulcers in the sacral, gluteal, Ischial locations. Overall in fact he only has two wounds remaining that appear to be open. This is the right gluteus and right ischial locations. Other than this everything that I have been managing up to this point appears greatly improved. Overall the patient states he's having minimal pain. He does have a foam cushion for his wheelchair. 03/22/18 on evaluation today patient actually appears to be doing much better in regard to his gluteal/Ischial wounds. In fact he just has one area remaining at this point in time in the right gluteal region. Fortunately he has made excellent progress at this point. MICHELL, KADER ([id number]) 04/10/18 on evaluation today patient actually appears to be doing worse compared to his previous evaluation. He tells me unfortunately that he has been doing a lot more sitting and getting in the bed last. I think this has directly led to a breakdown in the overall status of his wounds as well is pressure injury surrounding the wounds in the gluteal/sacral region in  general. Obviously this is not good and definitely something that we have been wanting to try to avoid. Subsequently he also tells me the home health is not been out changes dressings according to the patient since last Wednesday. 04/26/18 on evaluation today patient actually appears to be doing better in regard to his gluteal and Ischial wounds. He has been tolerating the dressing changes without complication. With that being said I do feel like he has been staying off of his bottom more at this point. It also does appear that he's in the process of potentially getting a new electric wheelchair and they are also looking for a potential better cushion for him as far as pressure relief is concerned at this point. Nonetheless I do believe that the patient has made excellent progress up to this point in the past two weeks period of Time. 05/08/18 on evaluation today patient actually appears to show signs of excellent improvement in general in regard to his ulcers in the gluteal region generally. He has been tolerating the dressing changes without complication. Fortunately there appears to be just one wound still open that he is dealing with at this point. Everything else has healed and has done extremely well. Electronic Signature(s) Signed: 05/09/2018 1:21:52 AM By: Lenda Kelp PA-C Entered By: Lenda Kelp on 05/08/2018 23:36:16 [name] ([id number]) -------------------------------------------------------------------------------- Physical Exam Details Patient Name: [name] Date of Service: 05/08/2018 2:45 PM Medical Record Number: [id number] Patient  Account Number: [id number] Date of Birth/Sex: [birth year] (62 y.o. M) Treating RN: Curtis Sites Primary Care Provider: Franco Nones Other Clinician: Referring Provider: Franco Nones Treating Provider/Extender: STONE III, Jaimi Belle Weeks in Treatment: 18 Constitutional Well-nourished and well-hydrated in no acute  distress. Respiratory normal breathing without difficulty. clear to auscultation bilaterally. Cardiovascular regular rate and rhythm with normal S1, S2. Psychiatric this patient is able to make decisions and demonstrates good insight into disease process. Alert and Oriented x 3. pleasant and cooperative. Notes Patient's wound bed currently shows evidence of excellent granulation with no significant slough noted on the surface of the wound. He has been tolerating the dressing changes without complication. Fortunately there appears to be no evidence of infection in general. No sharp debridement required today. Electronic Signature(s) Signed: 05/09/2018 1:21:52 AM By: Lenda Kelp PA-C Entered By: Lenda Kelp on 05/08/2018 23:36:48 [name] ([id number]) -------------------------------------------------------------------------------- Physician Orders Details Patient Name: [name] Date of Service: 05/08/2018 2:45 PM Medical Record Number: [id number] Patient Account Number: [id number] Date of Birth/Sex: 08-Apr-1956 (62 y.o. 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 o Cleanse wound with mild soap and water o May Shower, gently pat wound dry prior to applying new dressing. Anesthetic (add to Medication List) Wound #3 Right Ischium o Topical Lidocaine 4% cream  applied to wound bed prior to debridement (In Clinic Only). Primary Wound Dressing Wound #3 Right Ischium o Hydrafera Blue Ready Transfer Secondary Dressing Wound #3 Right Ischium o Boardered Foam Dressing Dressing Change Frequency Wound #3 Right Ischium o Change Dressing Monday, Wednesday, Friday - an as needed.Marland KitchenMarland KitchenHHRN to change the dressing for patient 3 times weekly because patient cannot change the dressing himself. Follow-up Appointments Wound #3 Right Ischium o Return Appointment in 2 weeks. Off-Loading Wound #3 Right Ischium o Turn and reposition every 2 hours Yellen, Floyed ([id number]) Additional Orders / Instructions Wound #3 Right Ischium o Increase protein intake. o Activity as tolerated Home Health Wound #3 Right Ischium o Continue Home Health Visits - [facility] - HHRN to change the dressing for patient 3 times weekly because patient cannot change the dressing himself. o Home Health Nurse may visit PRN to address patientos wound care needs. o FACE TO FACE ENCOUNTER: MEDICARE and MEDICAID PATIENTS: I certify that this patient is under my care and that I had a face-to-face encounter that meets the physician face-to-face encounter requirements with this patient on this date. The encounter with the patient was in whole or in part for the following MEDICAL CONDITION: (primary reason for Home Healthcare) MEDICAL NECESSITY: I certify, that based on my findings, NURSING services are a medically necessary home health service. HOME BOUND STATUS: I certify that my clinical findings support that this patient is homebound (i.e., Due to illness or injury, pt requires aid of supportive devices such as crutches, cane, wheelchairs, walkers, the use of special transportation or the assistance of another person to leave their place of residence. There is a normal inability to leave the home and doing so requires considerable and  taxing effort. Other absences are for  medical reasons / religious services and are infrequent or of short duration when for other reasons). o If current dressing causes regression in wound condition, may D/C ordered dressing product/s and apply Normal Saline Moist Dressing daily until next Wound Healing Center / Other MD appointment. Notify Wound Healing Center of regression in wound condition at [phone]. o Please direct any NON-WOUND related issues/requests for orders to patient's Primary Care Physician Electronic Signature(s) Signed: 05/08/2018 5:02:25 PM By: Curtis Sites Signed: 05/09/2018 1:21:52 AM By: Lenda Kelp PA-C Entered By: Curtis Sites on 05/08/2018 15:19:19 [name] ([id number]) -------------------------------------------------------------------------------- Problem List Details Patient Name: [name] Date of Service: 05/08/2018 2:45 PM Medical Record Number: [id number] Patient Account Number: [id number] Date of Birth/Sex: [birth year] (62 y.o. 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 01/02/2018 No Yes F17.210 Nicotine dependence, cigarettes, uncomplicated 01/02/2018 No Yes K70.30 Alcoholic cirrhosis of liver without ascites 01/02/2018 No Yes Inactive Problems Resolved Problems Electronic Signature(s) Signed: 05/09/2018 1:21:52 AM By: Lenda Kelp PA-C Entered By: Lenda Kelp on 05/08/2018 15:10:01 [name]  ([id number]) -------------------------------------------------------------------------------- Progress Note Details Patient Name: [name] Date of Service: 05/08/2018 2:45 PM Medical Record Number: [id number] Patient Account Number: [id number] Date of Birth/Sex: 01-06-1956 (62 y.o. M) Treating RN: Curtis Sites Primary Care Provider: Franco Nones Other Clinician: Referring Provider: Franco Nones Treating Provider/Extender: Linwood Dibbles, Arney Mayabb Weeks in Treatment: 18 Subjective Chief Complaint Information obtained from Patient Bilateral Gluteal Ulcers History of Present Illness (HPI) 09/14/17-he is here for evaluation of the left lower extremity injury. He is a resident of a group home and is accompanied by facility staff. He went to his primary care on 2/12 for a left lower extremity wound. At that appointment a wound culture was taken. A referral for wound care services was ordered on 2/26 and according to facility med rec he was started on Bactrim on 3/5. The patient and facility staff are very poor historians. It is assumed that the injury to the left leg was from scratching. He presents today with no open area, no erythema, no evidence of drainage. He is going to PCP later today for follow up. Readmission: 01/02/18 on evaluation today patient presents for reevaluation in the clinic although this is for a separate issue. He actually has several states to the pressure ulcers along his bilateral gluteal region which apparently have been present for several weeks now. He did see his primary care provider two weeks ago and they did initiate treatment. Home health was finally obtained although they were having a difficult time getting home health. It sounds like doing is actually used for the wound beds obviously don't think this is gonna be the best treatment option for him at this point. Fortunately there does not appear to be any evidence of infection at this time the patient does  have discomfort but not as much as he apparently had in the past. When he was originally dealing with these before going to see his primary care provider the pain was much more significant. He does have a foam  cushion for his wheelchair although I think being that he is wheelchair dependent he may benefit from a Roho cushion at this point. He also may benefit from an air mattress in my pinion he does have a hospital bed but again between the bed and his wheelchair he pretty much spends all of his time during the day and one of these two locations. I do think he staying too long in the wheelchair as far as sitting up as well which also I think needs to be addressed I did have a long conversation with him today concerning the fact that he needs to rotate positions and locations at least every two hours. This is something we're going to put on the notes as well to sin with him to the facility. I do think that this is going to be of utmost importance the dressings can be helpful but they are not gonna be able to manage this completely alone. He does have some hyper granular tissue noted. 01/09/18 on evaluation today patient presents for reevaluation concerning multiple pressure ulcers noted of the bilateral gluteal region. He has been tolerating the dressing changes without complication the good news is he seems to be making great progress at this point in time. There does not appear to be any evidence of infection at this time. No fevers, chills, nausea, or vomiting noted at this time. 01/23/18 Patient's wounds seem to show signs of good improvement at this point in time. He has been tolerating the dressing changes without complication. With that being said I do believe that he has been making excellent progress over the past several weeks. No fevers, chills, nausea, or vomiting noted at this time. 02/06/18 on evaluation today patient appears to be doing much better in regard to his gluteal and Ischial  ulcers. He's been tolerating the dressing changes without complication the Hydrofera Blue Dressing be doing excellent for him. Overall I'm very pleased in this regard. He's having no significant pain. 03/07/18 on evaluation today patient appears to be doing much better in regard to his ulcers in the sacral, gluteal, Ischial locations. Overall in fact he only has two wounds remaining that appear to be open. This is the right gluteus and right ischial locations. Other than this everything that I have been managing up to this point appears greatly improved. Overall the patient states he's having minimal pain. He does have a foam cushion for his wheelchair. ZONG, MCQUARRIE ([id number]) 03/22/18 on evaluation today patient actually appears to be doing much better in regard to his gluteal/Ischial wounds. In fact he just has one area remaining at this point in time in the right gluteal region. Fortunately he has made excellent progress at this point. 04/10/18 on evaluation today patient actually appears to be doing worse compared to his previous evaluation. He tells me unfortunately that he has been doing a lot more sitting and getting in the bed last. I think this has directly led to a breakdown in the overall status of his wounds as well is pressure injury surrounding the wounds in the gluteal/sacral region in general. Obviously this is not good and definitely something that we have been wanting to try to avoid. Subsequently he also tells me the home health is not been out changes dressings according to the patient since last Wednesday. 04/26/18 on evaluation today patient actually appears to be doing better in regard to his gluteal and Ischial wounds. He has been tolerating the dressing changes without complication. With that being said  I do feel like he has been staying off of his bottom more at this point. It also does appear that he's in the process of potentially getting a new electric wheelchair and  they are also looking for a potential better cushion for him as far as pressure relief is concerned at this point. Nonetheless I do believe that the patient has made excellent progress up to this point in the past two weeks period of Time. 05/08/18 on evaluation today patient actually appears to show signs of excellent improvement in general in regard to his ulcers in the gluteal region generally. He has been tolerating the dressing changes without complication. Fortunately there appears to be just one wound still open that he is dealing with at this point. Everything else has healed and has done extremely well. Patient History Information obtained from Patient. Family History Heart Disease - Siblings, Hypertension - Siblings, Lung Disease - Siblings, No family history of Cancer, Diabetes, Hereditary Spherocytosis, Kidney Disease, Seizures, Stroke, Thyroid Problems, Tuberculosis. Social History Current every day smoker, Marital Status - Single, Alcohol Use - Never - history of ETOH abuse, Drug Use - Prior History, Caffeine Use - Daily. Medical And Surgical History Notes Gastrointestinal Colostomy Musculoskeletal R AKA Review of Systems (ROS) Constitutional Symptoms (General Health) Denies complaints or symptoms of Fever, Chills. Respiratory The patient has no complaints or symptoms. Cardiovascular The patient has no complaints or symptoms. Psychiatric The patient has no complaints or symptoms. DOIS, JUARBE ([id number]) Objective Constitutional Well-nourished and well-hydrated in no acute distress. Vitals Time Taken: 2:45 PM, Height: 70 in, Weight: 155 lbs, BMI: 22.2, Temperature: 98.6 F, Pulse: 97 bpm, Respiratory Rate: 16 breaths/min, Blood Pressure: 150/95 mmHg. Respiratory normal breathing without difficulty. clear to auscultation bilaterally. Cardiovascular regular rate and rhythm with normal S1, S2. Psychiatric this patient is able to make decisions and demonstrates  good insight into disease process. Alert and Oriented x 3. pleasant and cooperative. General Notes: Patient's wound bed currently shows evidence of excellent granulation with no significant slough noted on the surface of the wound. He has been tolerating the dressing changes without complication. Fortunately there appears to be no evidence of infection in general. No sharp debridement required today. Integumentary (Hair, Skin) Wound #1R status is Healed - Epithelialized. Original cause of wound was Pressure Injury. The wound is located on the Right Gluteus. The wound measures 0cm length x 0cm width x 0cm depth; 0cm^2 area and 0cm^3 volume. Wound #3 status is Open. Original cause of wound was Pressure Injury. The wound is located on the Right Ischium. The wound measures 1.3cm length x 1cm width x 0.21cm depth; 1.021cm^2 area and 0.214cm^3 volume. 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 JUANCARLOS, CRESCENZO ([id number]) Cleanse wound with mild soap and water May Shower, gently pat wound dry prior to applying new dressing. Anesthetic (add to Medication List): Wound #3 Right Ischium: Topical Lidocaine 4% cream applied to wound bed prior to debridement (In Clinic Only). Primary Wound Dressing: Wound #3 Right Ischium: Hydrafera Blue Ready Transfer Secondary Dressing: Wound #3 Right Ischium: Boardered Foam Dressing Dressing Change Frequency: Wound #3 Right Ischium: Change Dressing Monday, Wednesday, Friday - an as needed.Marland KitchenMarland KitchenHHRN to change the dressing for patient 3 times weekly because patient cannot change  the dressing himself. Follow-up Appointments: Wound #3 Right  Ischium: Return Appointment in 2 weeks. Off-Loading: Wound #3 Right Ischium: Turn and reposition every 2 hours Additional Orders / Instructions: Wound #3 Right Ischium: Increase protein intake. Activity as tolerated Home Health: Wound #3 Right Ischium: Continue Home Health Visits - [facility] - HHRN to change the dressing for patient 3 times weekly because patient cannot change the dressing himself. Home Health Nurse may visit PRN to address patient s wound care needs. FACE TO FACE ENCOUNTER: MEDICARE and MEDICAID PATIENTS: I certify that this patient is under my care and that I had a face-to-face encounter that meets the physician face-to-face encounter requirements with this patient on this date. The encounter with the patient was in whole or in part for the following MEDICAL CONDITION: (primary reason for Home Healthcare) MEDICAL NECESSITY: I certify, that based on my findings, NURSING services are a medically necessary home health service. HOME BOUND STATUS: I certify that my clinical findings support that this patient is homebound (i.e., Due to illness or injury, pt requires aid of supportive devices such as crutches, cane, wheelchairs, walkers, the use of special transportation or the assistance of another person to leave their place of residence. There is a normal inability to leave the home and doing so requires considerable and taxing effort. Other absences are for medical reasons / religious services and are infrequent or of short duration when for other reasons). If current dressing causes regression in wound condition, may D/C ordered dressing product/s and apply Normal Saline Moist Dressing daily until next Wound Healing Center / Other MD appointment. Notify Wound Healing Center of regression in wound condition at [phone]. Please direct any NON-WOUND related issues/requests for orders to patient's Primary Care Physician I'm gonna suggest currently that we initiate and  continue the above wound care measures for the next week. Patient is in agreement with this plan. Hopefully everything will continue to show signs of improvement and will get this last area to heal rather rapidly. If the patient has any concerns or questions meantime he will contact the office and let me know. Please see above for specific wound care orders. We will see patient for re-evaluation in 2 week(s) here in the clinic. If anything worsens or changes patient will contact our office for additional recommendations. Electronic Signature(s) Signed: 05/09/2018 1:21:52 AM By: Lenda Kelp PA-C Entered By: Lenda Kelp on 05/08/2018 23:37:03 JONATHANDAVID, MARLETT ([id number]) ANSHUL, MEDDINGS ([id number]) -------------------------------------------------------------------------------- ROS/PFSH Details Patient Name: [name] Date of Service: 05/08/2018 2:45 PM Medical Record Number: [id number] Patient Account Number: [id number] Date of Birth/Sex: 1955/09/30 (62 y.o. M) Treating RN: Curtis Sites Primary Care Provider: Franco Nones Other Clinician: Referring Provider: Franco Nones Treating Provider/Extender: STONE III, Tyauna Lacaze Weeks in Treatment: 18 Information Obtained From Patient Wound History Do you currently have one or more open woundso Yes Approximately how long have you had your woundso 2 weeks How have you been treating your wound(s) until nowo Deuoderm Has your wound(s) ever healed and then re-openedo No Have you had any lab work done in the past montho No Have you tested positive for an antibiotic resistant organism (MRSA, VRE)o No Have you tested positive for osteomyelitis (bone infection)o No Have you had any tests for circulation on your legso No Constitutional Symptoms (General Health) Complaints and Symptoms: Negative for: Fever; Chills Eyes Medical History: Negative for: Cataracts; Glaucoma; Optic Neuritis Ear/Nose/Mouth/Throat Medical History: Positive  for: Chronic sinus problems/congestion Negative for: Middle ear problems Hematologic/Lymphatic Medical History: Positive  for: Anemia Negative for: Hemophilia; Human Immunodeficiency Virus; Lymphedema; Sickle Cell Disease Respiratory Complaints and Symptoms: No Complaints or Symptoms Medical History: Negative for: Aspiration; Asthma; Chronic Obstructive Pulmonary Disease (COPD); Pneumothorax; Sleep Apnea; Tuberculosis Cardiovascular Complaints and Symptoms: No Complaints or Symptoms Medical History: Positive for: Arrhythmia Bollard, Jaquarius ([id number]) Negative for: Angina; Congestive Heart Failure; Coronary Artery Disease; Deep Vein Thrombosis; Hypertension; Hypotension; Myocardial Infarction; Peripheral Arterial Disease; Peripheral Venous Disease; Phlebitis; Vasculitis Gastrointestinal Medical History: Positive for: Hepatitis C Negative for: Cirrhosis ; Colitis; Crohnos; Hepatitis A; Hepatitis B Past Medical History Notes: Colostomy Endocrine Medical History: Positive for: Type II Diabetes Time with diabetes: 1 year Treated with: Insulin Blood sugar tested every day: Yes Tested : Genitourinary Medical History: Negative for: End Stage Renal Disease Immunological Medical History: Negative for: Lupus Erythematosus; Raynaudos; Scleroderma Musculoskeletal Medical History: Past Medical History Notes: R AKA Neurologic Medical History: Negative for: Dementia; Neuropathy; Quadriplegia; Paraplegia; Seizure Disorder Psychiatric Complaints and Symptoms: No Complaints or Symptoms HBO Extended History Items Ear/Nose/Mouth/Throat: Chronic sinus problems/congestion Immunizations Pneumococcal Vaccine: Received Pneumococcal Vaccination: Yes Immunization Notes: up to date Implantable Devices Family and Social History NAZARIO, RUSSOM ([id number]) Cancer: No; Diabetes: No; Heart Disease: Yes - Siblings; Hereditary Spherocytosis: No; Hypertension: Yes - Siblings; Kidney Disease:  No; Lung Disease: Yes - Siblings; Seizures: No; Stroke: No; Thyroid Problems: No; Tuberculosis: No; Current every day smoker; Marital Status - Single; Alcohol Use: Never - history of ETOH abuse; Drug Use: Prior History; Caffeine Use: Daily; Financial Concerns: No; Food, Clothing or Shelter Needs: No; Support System Lacking: No; Transportation Concerns: No; Advanced Directives: No; Patient does not want information on Advanced Directives Physician Affirmation I have reviewed and agree with the above information. Electronic Signature(s) Signed: 05/09/2018 1:21:52 AM By: Lenda Kelp PA-C Signed: 05/09/2018 5:41:21 PM By: Curtis Sites Entered By: Lenda Kelp on 05/08/2018 23:36:37 [name] ([id number]) -------------------------------------------------------------------------------- SuperBill Details Patient Name: [name] Date of Service: 05/08/2018 Medical Record Number: [id number] Patient Account Number: [id number] Date of Birth/Sex: Apr 12, 1956 (62 y.o. 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

## 2018-05-12 NOTE — Progress Notes (Signed)
GERMAN, MANKE ([id number]) Visit Report for 05/08/2018 Arrival Information Details Patient Name: [name], [name] Date of Service: 05/08/2018 2:45 PM Medical Record Number: [id number] Patient Account Number: [id number] Date of Birth/Sex: 1956-06-26 (62 y.o. M) Treating RN: Curtis Sites Primary Care Rayola Everhart: Franco Nones Other Clinician: Referring Corry Storie: Franco Nones Treating Zimri Brennen/Extender: [name] Dibbles, HOYT Weeks in Treatment: 18 Visit Information History Since Last Visit Added or deleted any medications: Yes Patient Arrived: Wheel Chair Any new allergies or adverse reactions: No Arrival Time: 14:44 Had a fall or experienced change in No activities of daily living that may affect Accompanied By: caregiver risk of falls: Transfer Assistance: None Signs or symptoms of abuse/neglect since last visito No Patient Identification Verified: Yes Hospitalized since last visit: No Secondary Verification Process Completed: Yes Implantable device outside of the clinic excluding No Patient Requires Transmission-Based No cellular tissue based products placed in the center Precautions: since last visit: Patient Has Alerts: No Has Dressing in Place as Prescribed: Yes Pain Present Now: No Electronic Signature(s) Signed: 05/08/2018 4:25:36 PM By: Dayton Martes RCP, RRT, CHT Entered By: Dayton Martes on 05/08/2018 14:47:21 [name] ([id number]) -------------------------------------------------------------------------------- Clinic Level of Care Assessment Details Patient Name: [name] Date of Service: 05/08/2018 2:45 PM Medical Record Number: [id number] Patient Account Number: [id number] Date of Birth/Sex: [birth year] (62 y.o. M) Treating RN: Curtis Sites Primary Care Mikeya Tomasetti: Franco Nones Other Clinician: Referring Tsuruko Murtha: Franco Nones Treating Jeremaih Klima/Extender: [name] Dibbles, HOYT Weeks in Treatment: 18 Clinic Level of Care  Assessment Items TOOL 4 Quantity Score []  - Use when only an EandM is performed on FOLLOW-UP visit 0 ASSESSMENTS - Nursing Assessment / Reassessment X - Reassessment of Co-morbidities (includes updates in patient status) 1 10 X- 1 5 Reassessment of Adherence to Treatment Plan ASSESSMENTS - Wound and Skin Assessment / Reassessment []  - Simple Wound Assessment / Reassessment - one wound 0 X- 2 5 Complex Wound Assessment / Reassessment - multiple wounds []  - 0 Dermatologic / Skin Assessment (not related to wound area) ASSESSMENTS - Focused Assessment []  - Circumferential Edema Measurements - multi extremities 0 []  - 0 Nutritional Assessment / Counseling / Intervention []  - 0 Lower Extremity Assessment (monofilament, tuning fork, pulses) []  - 0 Peripheral Arterial Disease Assessment (using hand held doppler) ASSESSMENTS - Ostomy and/or Continence Assessment and Care []  - Incontinence Assessment and Management 0 []  - 0 Ostomy Care Assessment and Management (repouching, etc.) PROCESS - Coordination of Care X - Simple Patient / Family Education for ongoing care 1 15 []  - 0 Complex (extensive) Patient / Family Education for ongoing care []  - 0 Staff obtains Chiropractor, Records, Test Results / Process Orders []  - 0 Staff telephones HHA, Nursing Homes / Clarify orders / etc []  - 0 Routine Transfer to another Facility (non-emergent condition) []  - 0 Routine Hospital Admission (non-emergent condition) []  - 0 New Admissions / Manufacturing engineer / Ordering NPWT, Apligraf, etc. []  - 0 Emergency Hospital Admission (emergent condition) X- 1 10 Simple Discharge Coordination Rowlands, Reece ([id number]) []  - 0 Complex (extensive) Discharge Coordination PROCESS - Special Needs []  - Pediatric / Minor Patient Management 0 []  - 0 Isolation Patient Management []  - 0 Hearing / Language / Visual special needs []  - 0 Assessment of Community assistance (transportation, D/C planning,  etc.) []  - 0 Additional assistance / Altered mentation []  - 0 Support Surface(s) Assessment (bed, cushion, seat, etc.) INTERVENTIONS - Wound Cleansing / Measurement []  - Simple Wound Cleansing - one wound 0 X- 2 5 Complex Wound Cleansing -  multiple wounds X- 1 5 Wound Imaging (photographs - any number of wounds) []  - 0 Wound Tracing (instead of photographs) []  - 0 Simple Wound Measurement - one wound X- 2 5 Complex Wound Measurement - multiple wounds INTERVENTIONS - Wound Dressings X - Small Wound Dressing one or multiple wounds 1 10 []  - 0 Medium Wound Dressing one or multiple wounds []  - 0 Large Wound Dressing one or multiple wounds []  - 0 Application of Medications - topical []  - 0 Application of Medications - injection INTERVENTIONS - Miscellaneous []  - External ear exam 0 []  - 0 Specimen Collection (cultures, biopsies, blood, body fluids, etc.) []  - 0 Specimen(s) / Culture(s) sent or taken to Lab for analysis []  - 0 Patient Transfer (multiple staff / Nurse, adult / Similar devices) []  - 0 Simple Staple / Suture removal (25 or less) []  - 0 Complex Staple / Suture removal (26 or more) []  - 0 Hypo / Hyperglycemic Management (close monitor of Blood Glucose) []  - 0 Ankle / Brachial Index (ABI) - do not check if billed separately X- 1 5 Vital Signs Teichert, Rollin ([id number]) Has the patient been seen at the hospital within the last three years: Yes Total Score: 90 Level Of Care: New/Established - Level 3 Electronic Signature(s) Signed: 05/08/2018 5:02:25 PM By: Curtis Sites Entered By: Curtis Sites on 05/08/2018 15:20:16 [name] ([id number]) -------------------------------------------------------------------------------- Encounter Discharge Information Details Patient Name: [name] Date of Service: 05/08/2018 2:45 PM Medical Record Number: [id number] Patient Account Number: [id number] Date of Birth/Sex: 05-04-1956 (62 y.o. M) Treating RN:  Curtis Sites Primary Care Anita Mcadory: Franco Nones Other Clinician: Referring Katena Petitjean: Franco Nones Treating Dawnette Mione/Extender: [name] Dibbles, HOYT Weeks in Treatment: 18 Encounter Discharge Information Items Discharge Condition: Stable Ambulatory Status: Wheelchair Discharge Destination: Home Transportation: Private Auto Accompanied By: caregiver Schedule Follow-up Appointment: Yes Clinical Summary of Care: Electronic Signature(s) Signed: 05/08/2018 5:02:25 PM By: Curtis Sites Entered By: Curtis Sites on 05/08/2018 15:21:08 [name] ([id number]) -------------------------------------------------------------------------------- Lower Extremity Assessment Details Patient Name: [name] Date of Service: 05/08/2018 2:45 PM Medical Record Number: [id number] Patient Account Number: [id number] Date of Birth/Sex: Jan 28, 1956 (62 y.o. M) Treating RN: Huel Coventry Primary Care Ceazia Harb: Franco Nones Other Clinician: Referring Yee Joss: Franco Nones Treating Eames Dibiasio/Extender: Skeet Simmer in Treatment: 18 Electronic Signature(s) Signed: 05/10/2018 7:26:08 AM By: Elliot Gurney, BSN, RN, CWS, Kim RN, BSN Entered By: Elliot Gurney, BSN, RN, CWS, Kim on 05/08/2018 14:54:58 [name] ([id number]) -------------------------------------------------------------------------------- Multi Wound Chart Details Patient Name: [name] Date of Service: 05/08/2018 2:45 PM Medical Record Number: [id number] Patient Account Number: [id number] Date of Birth/Sex: 1956-05-23 (62 y.o. M) Treating RN: Curtis Sites Primary Care Tylesha Gibeault: Franco Nones Other Clinician: Referring Navi Ewton: Franco Nones Treating Caspian Deleonardis/Extender: STONE III, HOYT Weeks in Treatment: 18 Vital Signs Height(in): 70 Pulse(bpm): 97 Weight(lbs): 155 Blood Pressure(mmHg): 150/95 Body Mass Index(BMI): 22 Temperature(F): 98.6 Respiratory Rate 16 (breaths/min): Photos: [1R:No Photos] [3:No Photos]  [N/A:N/A] Wound Location: [1R:Right Gluteus] [3:Right Ischium] [N/A:N/A] Wounding Event: [1R:Pressure Injury] [3:Pressure Injury] [N/A:N/A] Primary Etiology: [1R:Pressure Ulcer] [3:Pressure Ulcer] [N/A:N/A] Date Acquired: [1R:12/11/2017] [3:12/15/2017] [N/A:N/A] Weeks of Treatment: [1R:18] [3:18] [N/A:N/A] Wound Status: [1R:Healed - Epithelialized] [3:Open] [N/A:N/A] Wound Recurrence: [1R:Yes] [3:No] [N/A:N/A] Measurements L x W x D [1R:0x0x0] [3:1.3x1x0.21] [N/A:N/A] (cm) Area (cm) : [1R:0] [3:1.021] [N/A:N/A] Volume (cm) : [1R:0] [3:0.214] [N/A:N/A] % Reduction in Area: [1R:100.00%] [3:86.50%] [N/A:N/A] % Reduction in Volume: [1R:100.00%] [3:71.60%] [N/A:N/A] Classification: [1R:Category/Stage III] [3:Category/Stage III] [N/A:N/A] Periwound Skin Texture: [1R:No Abnormalities Noted] [3:No Abnormalities Noted] [N/A:N/A] Periwound Skin Moisture: [1R:No  Abnormalities Noted] [3:No Abnormalities Noted] [N/A:N/A] Periwound Skin Color: [1R:No Abnormalities Noted No] [3:No Abnormalities Noted No] [N/A:N/A N/A] Treatment Notes Electronic Signature(s) Signed: 05/08/2018 5:02:25 PM By: Curtis Sites Entered By: Curtis Sites on 05/08/2018 15:16:03 [name] ([id number]) -------------------------------------------------------------------------------- Multi-Disciplinary Care Plan Details Patient Name: [name] Date of Service: 05/08/2018 2:45 PM Medical Record Number: [id number] Patient Account Number: [id number] Date of Birth/Sex: Nov 11, 1955 (62 y.o. M) Treating RN: Curtis Sites Primary Care Abbi Mancini: Franco Nones Other Clinician: Referring Markez Dowland: Franco Nones Treating Gentry Seeber/Extender: STONE III, HOYT Weeks in Treatment: 18 Active Inactive ` Abuse / Safety / Falls / Self Care Management Nursing Diagnoses: Impaired physical mobility Goals: Patient will not develop complications from immobility Date Initiated: 01/02/2018 Target Resolution Date: 02/16/2018 Goal  Status: Active Interventions: Assess fall risk on admission and as needed Notes: ` Nutrition Nursing Diagnoses: Potential for alteratiion in Nutrition/Potential for imbalanced nutrition Goals: Patient/caregiver agrees to and verbalizes understanding of need to use nutritional supplements and/or vitamins as prescribed Date Initiated: 01/02/2018 Target Resolution Date: 03/17/2018 Goal Status: Active Interventions: Provide education on nutrition Notes: ` Orientation to the Wound Care Program Nursing Diagnoses: Knowledge deficit related to the wound healing center program Goals: Patient/caregiver will verbalize understanding of the Wound Healing Center Program Date Initiated: 01/02/2018 Target Resolution Date: 02/16/2018 Goal Status: Active Interventions: THEODORE, VIRGIN ([id number]) Provide education on orientation to the wound center Notes: ` Pressure Nursing Diagnoses: Potential for impaired tissue integrity related to pressure, friction, moisture, and shear Goals: Patient will remain free of pressure ulcers Date Initiated: 01/02/2018 Target Resolution Date: 03/17/2018 Goal Status: Active Interventions: Assess potential for pressure ulcer upon admission and as needed Notes: ` Wound/Skin Impairment Nursing Diagnoses: Impaired tissue integrity Goals: Ulcer/skin breakdown will heal within 14 weeks Date Initiated: 01/02/2018 Target Resolution Date: 03/16/2018 Goal Status: Active Interventions: Assess patient/caregiver ability to obtain necessary supplies Assess patient/caregiver ability to perform ulcer/skin care regimen upon admission and as needed Assess ulceration(s) every visit Notes: Electronic Signature(s) Signed: 05/08/2018 5:02:25 PM By: Curtis Sites Entered By: Curtis Sites on 05/08/2018 15:15:55 [name] ([id number]) -------------------------------------------------------------------------------- Pain Assessment Details Patient Name: [name] Date of Service: 05/08/2018 2:45 PM Medical Record Number: [id number] Patient Account Number: [id number] Date of Birth/Sex: 1956/06/24 (62 y.o. M) Treating RN: Curtis Sites Primary Care Herlinda Heady: Franco Nones Other Clinician: Referring Candace Begue: Franco Nones Treating Zvi Duplantis/Extender: STONE III, HOYT Weeks in Treatment: 18 Active Problems Location of Pain Severity and Description of Pain Patient Has Paino No Site Locations Pain Management and Medication Current Pain Management: Electronic Signature(s) Signed: 05/08/2018 4:25:36 PM By: Sallee Provencal, RRT, CHT Signed: 05/08/2018 5:02:25 PM By: Curtis Sites Entered By: Dayton Martes on 05/08/2018 14:47:36 [name] ([id number]) -------------------------------------------------------------------------------- Patient/Caregiver Education Details Patient Name: [name] Date of Service: 05/08/2018 2:45 PM Medical Record Number: [id number] Patient Account Number: [id number] Date of Birth/Gender: March 23, 1956 (62 y.o. M) Treating RN: Curtis Sites Primary Care Physician: Franco Nones Other Clinician: Referring Physician: Franco Nones Treating Physician/Extender: Skeet Simmer in Treatment: 18 Education Assessment Education Provided To: Patient Education Topics Provided Offloading: Handouts: Other: continue offloading Methods: Explain/Verbal Responses: State content correctly Electronic Signature(s) Signed: 05/08/2018 5:02:25 PM By: Curtis Sites Entered By: Curtis Sites on 05/08/2018 15:20:38 [name] ([id number]) -------------------------------------------------------------------------------- Wound Assessment Details Patient Name: [name] Date of Service: 05/08/2018 2:45 PM Medical Record Number: [id number] Patient Account Number: [id number] Date of Birth/Sex: Oct 18, 1955 (62 y.o. M) Treating RN: Huel Coventry Primary Care Cymone Yeske: Franco Nones Other Clinician: Referring Bambie Pizzolato: Franco Nones Treating  Kyani Simkin/Extender: [name] Dibbles, HOYT Weeks in Treatment: 18 Wound Status Wound Number: 1R Primary Etiology: Pressure Ulcer Wound Location: Right Gluteus Wound Status: Healed - Epithelialized Wounding Event: Pressure Injury Date Acquired: 12/11/2017 Weeks Of Treatment: 18 Clustered Wound: No Photos Photo Uploaded By: Elliot Gurney, BSN, RN, CWS, Kim on 05/08/2018 16:35:43 Wound Measurements Length: (cm) 0 Width: (cm) 0 Depth: (cm) 0 Area: (cm) 0 Volume: (cm) 0 % Reduction in Area: 100% % Reduction in Volume: 100% Wound Description Classification: Category/Stage III Periwound Skin Texture Texture Color No Abnormalities Noted: No No Abnormalities Noted: No Moisture No Abnormalities Noted: No Electronic Signature(s) Signed: 05/10/2018 7:26:08 AM By: Elliot Gurney, BSN, RN, CWS, Kim RN, BSN Entered By: Elliot Gurney, BSN, RN, CWS, Kim on 05/08/2018 14:54:44 [name] ([id number]) -------------------------------------------------------------------------------- Wound Assessment Details Patient Name: [name] Date of Service: 05/08/2018 2:45 PM Medical Record Number: [id number] Patient Account Number: [id number] Date of Birth/Sex: 1955-10-09 (62 y.o. M) Treating RN: Huel Coventry Primary Care Burnham Trost: Franco Nones Other Clinician: Referring Mileydi Milsap: Franco Nones Treating Ivo Moga/Extender: STONE III, HOYT Weeks in Treatment: 18 Wound Status Wound Number: 3 Primary Etiology: Pressure Ulcer Wound Location: Right Ischium Wound Status: Open Wounding Event: Pressure Injury Date Acquired: 12/15/2017 Weeks Of Treatment: 18 Clustered Wound: No Photos Photo Uploaded By: Elliot Gurney, BSN, RN, CWS, Kim on 05/08/2018 16:35:43 Wound Measurements Length: (cm) 1.3 Width: (cm) 1 Depth: (cm) 0.21 Area: (cm) 1.021 Volume: (cm) 0.214 % Reduction in Area: 86.5% % Reduction in Volume: 71.6% Wound Description Classification:  Category/Stage III Periwound Skin Texture Texture Color No Abnormalities Noted: No No Abnormalities Noted: No Moisture No Abnormalities Noted: No Treatment Notes Wound #3 (Right Ischium) 1. Cleansed with: Clean wound with Normal Saline 2. Anesthetic Topical Lidocaine 4% cream to wound bed prior to debridement 4. Dressing Applied: AYOMIDE, PURDY ([id number]) Hydrafera Blue 5. Secondary Dressing Applied Bordered Foam Dressing Electronic Signature(s) Signed: 05/10/2018 7:26:08 AM By: Elliot Gurney, BSN, RN, CWS, Kim RN, BSN Entered By: Elliot Gurney, BSN, RN, CWS, Kim on 05/08/2018 14:54:45 [name] ([id number]) -------------------------------------------------------------------------------- Vitals Details Patient Name: [name] Date of Service: 05/08/2018 2:45 PM Medical Record Number: [id number] Patient Account Number: [id number] Date of Birth/Sex: 1956-02-21 (62 y.o. M) Treating RN: Curtis Sites Primary Care Vala Raffo: Franco Nones Other Clinician: Referring Caydance Kuehnle: Franco Nones Treating Myalynn Lingle/Extender: STONE III, HOYT Weeks in Treatment: 18 Vital Signs Time Taken: 14:45 Temperature (F): 98.6 Height (in): 70 Pulse (bpm): 97 Weight (lbs): 155 Respiratory Rate (breaths/min): 16 Body Mass Index (BMI): 22.2 Blood Pressure (mmHg): 150/95 Reference Range: 80 - 120 mg / dl Electronic Signature(s) Signed: 05/08/2018 4:25:36 PM By: Dayton Martes RCP, RRT, CHT Entered By: Weyman Rodney, Lucio Edward on 05/08/2018 14:48:24

## 2018-05-22 ENCOUNTER — Encounter: Payer: Medicaid Other | Attending: Physician Assistant | Admitting: Physician Assistant

## 2018-05-22 DIAGNOSIS — Z933 Colostomy status: Secondary | ICD-10-CM | POA: Diagnosis not present

## 2018-05-22 DIAGNOSIS — L89323 Pressure ulcer of left buttock, stage 3: Secondary | ICD-10-CM | POA: Insufficient documentation

## 2018-05-22 DIAGNOSIS — F17210 Nicotine dependence, cigarettes, uncomplicated: Secondary | ICD-10-CM | POA: Insufficient documentation

## 2018-05-22 DIAGNOSIS — L89313 Pressure ulcer of right buttock, stage 3: Secondary | ICD-10-CM | POA: Insufficient documentation

## 2018-05-22 DIAGNOSIS — Z993 Dependence on wheelchair: Secondary | ICD-10-CM | POA: Insufficient documentation

## 2018-05-22 DIAGNOSIS — Z794 Long term (current) use of insulin: Secondary | ICD-10-CM | POA: Diagnosis not present

## 2018-05-22 DIAGNOSIS — E11622 Type 2 diabetes mellitus with other skin ulcer: Secondary | ICD-10-CM | POA: Insufficient documentation

## 2018-05-22 DIAGNOSIS — K7030 Alcoholic cirrhosis of liver without ascites: Secondary | ICD-10-CM | POA: Insufficient documentation

## 2018-05-22 DIAGNOSIS — Z89611 Acquired absence of right leg above knee: Secondary | ICD-10-CM | POA: Diagnosis not present

## 2018-05-22 DIAGNOSIS — B1920 Unspecified viral hepatitis C without hepatic coma: Secondary | ICD-10-CM | POA: Insufficient documentation

## 2018-05-24 NOTE — Progress Notes (Signed)
CULLEY, HEDEEN ([id number]) Visit Report for 05/22/2018 Arrival Information Details Patient Name: [name], [name] Date of Service: 05/22/2018 1:45 PM Medical Record Number: [id number] Patient Account Number: [id number] Date of Birth/Sex: August 10, 1955 (62 y.o. M) Treating RN: Huel Coventry Primary Care Marthann Abshier: Franco Nones Other Clinician: Referring Tovah Slavick: Franco Nones Treating Lyssa Hackley/Extender: Linwood Dibbles, HOYT Weeks in Treatment: 20 Visit Information History Since Last Visit Added or deleted any medications: No Patient Arrived: Ambulatory Any new allergies or adverse reactions: No Arrival Time: 13:59 Had a fall or experienced change in No Accompanied By: self activities of daily living that may affect Transfer Assistance: EasyPivot Patient risk of falls: Lift Signs or symptoms of abuse/neglect since last visito No Patient Identification Verified: Yes Hospitalized since last visit: No Secondary Verification Process Yes Implantable device outside of the clinic excluding No Completed: cellular tissue based products placed in the center Patient Requires Transmission-Based No since last visit: Precautions: Has Dressing in Place as Prescribed: Yes Patient Has Alerts: No Pain Present Now: No Electronic Signature(s) Signed: 05/22/2018 4:48:54 PM By: Elliot Gurney, BSN, RN, CWS, Kim RN, BSN Entered By: Elliot Gurney, BSN, RN, CWS, Kim on 05/22/2018 14:00:16 [name] ([id number]) -------------------------------------------------------------------------------- Clinic Level of Care Assessment Details Patient Name: [name] Date of Service: 05/22/2018 1:45 PM Medical Record Number: [id number] Patient Account Number: [id number] Date of Birth/Sex: Dec 01, 1955 (62 y.o. M) Treating RN: Huel Coventry Primary Care Melanie Pellot: Franco Nones Other Clinician: Referring Alvie Fowles: Franco Nones Treating Xavion Muscat/Extender: Linwood Dibbles, HOYT Weeks in Treatment: 20 Clinic Level of Care Assessment  Items TOOL 4 Quantity Score []  - Use when only an EandM is performed on FOLLOW-UP visit 0 ASSESSMENTS - Nursing Assessment / Reassessment []  - Reassessment of Co-morbidities (includes updates in patient status) 0 X- 1 5 Reassessment of Adherence to Treatment Plan ASSESSMENTS - Wound and Skin Assessment / Reassessment X - Simple Wound Assessment / Reassessment - one wound 1 5 []  - 0 Complex Wound Assessment / Reassessment - multiple wounds []  - 0 Dermatologic / Skin Assessment (not related to wound area) ASSESSMENTS - Focused Assessment []  - Circumferential Edema Measurements - multi extremities 0 []  - 0 Nutritional Assessment / Counseling / Intervention []  - 0 Lower Extremity Assessment (monofilament, tuning fork, pulses) []  - 0 Peripheral Arterial Disease Assessment (using hand held doppler) ASSESSMENTS - Ostomy and/or Continence Assessment and Care []  - Incontinence Assessment and Management 0 []  - 0 Ostomy Care Assessment and Management (repouching, etc.) PROCESS - Coordination of Care X - Simple Patient / Family Education for ongoing care 1 15 []  - 0 Complex (extensive) Patient / Family Education for ongoing care X- 1 10 Staff obtains Chiropractor, Records, Test Results / Process Orders []  - 0 Staff telephones HHA, Nursing Homes / Clarify orders / etc []  - 0 Routine Transfer to another Facility (non-emergent condition) []  - 0 Routine Hospital Admission (non-emergent condition) []  - 0 New Admissions / Manufacturing engineer / Ordering NPWT, Apligraf, etc. []  - 0 Emergency Hospital Admission (emergent condition) X- 1 10 Simple Discharge Coordination Higgins, Shawn ([id number]) []  - 0 Complex (extensive) Discharge Coordination PROCESS - Special Needs []  - Pediatric / Minor Patient Management 0 []  - 0 Isolation Patient Management []  - 0 Hearing / Language / Visual special needs []  - 0 Assessment of Community assistance (transportation, D/C planning, etc.) []  -  0 Additional assistance / Altered mentation []  - 0 Support Surface(s) Assessment (bed, cushion, seat, etc.) INTERVENTIONS - Wound Cleansing / Measurement X - Simple Wound Cleansing - one wound 1 5 []  -  0 Complex Wound Cleansing - multiple wounds X- 1 5 Wound Imaging (photographs - any number of wounds) []  - 0 Wound Tracing (instead of photographs) X- 1 5 Simple Wound Measurement - one wound []  - 0 Complex Wound Measurement - multiple wounds INTERVENTIONS - Wound Dressings []  - Small Wound Dressing one or multiple wounds 0 X- 1 15 Medium Wound Dressing one or multiple wounds []  - 0 Large Wound Dressing one or multiple wounds []  - 0 Application of Medications - topical []  - 0 Application of Medications - injection INTERVENTIONS - Miscellaneous []  - External ear exam 0 []  - 0 Specimen Collection (cultures, biopsies, blood, body fluids, etc.) []  - 0 Specimen(s) / Culture(s) sent or taken to Lab for analysis []  - 0 Patient Transfer (multiple staff / Nurse, adult / Similar devices) []  - 0 Simple Staple / Suture removal (25 or less) []  - 0 Complex Staple / Suture removal (26 or more) []  - 0 Hypo / Hyperglycemic Management (close monitor of Blood Glucose) []  - 0 Ankle / Brachial Index (ABI) - do not check if billed separately X- 1 5 Vital Signs Higgins, Shawn ([id number]) Has the patient been seen at the hospital within the last three years: Yes Total Score: 80 Level Of Care: New/Established - Level 3 Electronic Signature(s) Signed: 05/22/2018 4:48:54 PM By: Elliot Gurney, BSN, RN, CWS, Kim RN, BSN Entered By: Elliot Gurney, BSN, RN, CWS, Kim on 05/22/2018 14:20:55 [name] ([id number]) -------------------------------------------------------------------------------- Encounter Discharge Information Details Patient Name: [name] Date of Service: 05/22/2018 1:45 PM Medical Record Number: [id number] Patient Account Number: [id number] Date of Birth/Sex: Nov 23, 1955 (62 y.o.  M) Treating RN: Huel Coventry Primary Care Rasaan Brotherton: Franco Nones Other Clinician: Referring Zyonna Vardaman: Franco Nones Treating Jestine Bicknell/Extender: Linwood Dibbles, HOYT Weeks in Treatment: 20 Encounter Discharge Information Items Discharge Condition: Stable Ambulatory Status: Wheelchair Discharge Destination: Home Transportation: Private Auto Accompanied By: caregiver Schedule Follow-up Appointment: Yes Clinical Summary of Care: Electronic Signature(s) Signed: 05/22/2018 2:22:23 PM By: Elliot Gurney, BSN, RN, CWS, Kim RN, BSN Entered By: Elliot Gurney, BSN, RN, CWS, Kim on 05/22/2018 14:22:23 [name] ([id number]) -------------------------------------------------------------------------------- Lower Extremity Assessment Details Patient Name: [name] Date of Service: 05/22/2018 1:45 PM Medical Record Number: [id number] Patient Account Number: [id number] Date of Birth/Sex: Nov 28, 1955 (62 y.o. M) Treating RN: Huel Coventry Primary Care Rose Hegner: Franco Nones Other Clinician: Referring Rhylan Kagel: Franco Nones Treating Vencil Basnett/Extender: Skeet Simmer in Treatment: 20 Electronic Signature(s) Signed: 05/22/2018 4:48:54 PM By: Elliot Gurney, BSN, RN, CWS, Kim RN, BSN Entered By: Elliot Gurney, BSN, RN, CWS, Kim on 05/22/2018 14:05:15 [name] ([id number]) -------------------------------------------------------------------------------- Multi Wound Chart Details Patient Name: [name] Date of Service: 05/22/2018 1:45 PM Medical Record Number: [id number] Patient Account Number: [id number] Date of Birth/Sex: [birth year] (62 y.o. M) Treating RN: Huel Coventry Primary Care Shalana Jardin: Franco Nones Other Clinician: Referring Sable Knoles: Franco Nones Treating Lakeyn Dokken/Extender: STONE III, HOYT Weeks in Treatment: 20 Vital Signs Height(in): 70 Pulse(bpm): 77 Weight(lbs): 155 Blood Pressure(mmHg): 119/79 Body Mass Index(BMI): 22 Temperature(F): 98.3 Respiratory  Rate 16 (breaths/min): Photos: [3:No Photos] [N/A:N/A] Wound Location: [3:Right Ischium] [N/A:N/A] Wounding Event: [3:Pressure Injury] [N/A:N/A] Primary Etiology: [3:Pressure Ulcer] [N/A:N/A] Date Acquired: [3:12/15/2017] [N/A:N/A] Weeks of Treatment: [3:20] [N/A:N/A] Wound Status: [3:Open] [N/A:N/A] Measurements L x W x D [3:0.5x0.3x0.1] [N/A:N/A] (cm) Area (cm) : [3:0.118] [N/A:N/A] Volume (cm) : [3:0.012] [N/A:N/A] % Reduction in Area: [3:98.40%] [N/A:N/A] % Reduction in Volume: [3:98.40%] [N/A:N/A] Classification: [3:Category/Stage III] [N/A:N/A] Periwound Skin Texture: [3:No Abnormalities Noted] [N/A:N/A] Periwound Skin Moisture: [3:No Abnormalities Noted] [N/A:N/A] Periwound Skin Color: [3:No Abnormalities  Noted No] [N/A:N/A N/A] Treatment Notes Electronic Signature(s) Signed: 05/22/2018 2:20:00 PM By: Elliot Gurney, BSN, RN, CWS, Kim RN, BSN Entered By: Elliot Gurney, BSN, RN, CWS, Kim on 05/22/2018 14:19:59 [name] ([id number]) -------------------------------------------------------------------------------- Multi-Disciplinary Care Plan Details Patient Name: [name] Date of Service: 05/22/2018 1:45 PM Medical Record Number: [id number] Patient Account Number: [id number] Date of Birth/Sex: 11/10/1955 (62 y.o. M) Treating RN: Huel Coventry Primary Care Malcom Selmer: Franco Nones Other Clinician: Referring Virginia Curl: Franco Nones Treating Margrit Minner/Extender: Linwood Dibbles, HOYT Weeks in Treatment: 20 Active Inactive ` Abuse / Safety / Falls / Self Care Management Nursing Diagnoses: Impaired physical mobility Goals: Patient will not develop complications from immobility Date Initiated: 01/02/2018 Target Resolution Date: 02/16/2018 Goal Status: Active Interventions: Assess fall risk on admission and as needed Notes: ` Nutrition Nursing Diagnoses: Potential for alteratiion in Nutrition/Potential for imbalanced nutrition Goals: Patient/caregiver agrees to and verbalizes  understanding of need to use nutritional supplements and/or vitamins as prescribed Date Initiated: 01/02/2018 Target Resolution Date: 03/17/2018 Goal Status: Active Interventions: Provide education on nutrition Notes: ` Orientation to the Wound Care Program Nursing Diagnoses: Knowledge deficit related to the wound healing center program Goals: Patient/caregiver will verbalize understanding of the Wound Healing Center Program Date Initiated: 01/02/2018 Target Resolution Date: 02/16/2018 Goal Status: Active Interventions: SUDEEP, SCHEIBEL ([id number]) Provide education on orientation to the wound center Notes: ` Pressure Nursing Diagnoses: Potential for impaired tissue integrity related to pressure, friction, moisture, and shear Goals: Patient will remain free of pressure ulcers Date Initiated: 01/02/2018 Target Resolution Date: 03/17/2018 Goal Status: Active Interventions: Assess potential for pressure ulcer upon admission and as needed Notes: ` Wound/Skin Impairment Nursing Diagnoses: Impaired tissue integrity Goals: Ulcer/skin breakdown will heal within 14 weeks Date Initiated: 01/02/2018 Target Resolution Date: 03/16/2018 Goal Status: Active Interventions: Assess patient/caregiver ability to obtain necessary supplies Assess patient/caregiver ability to perform ulcer/skin care regimen upon admission and as needed Assess ulceration(s) every visit Notes: Electronic Signature(s) Signed: 05/22/2018 2:19:48 PM By: Elliot Gurney, BSN, RN, CWS, Kim RN, BSN Entered By: Elliot Gurney, BSN, RN, CWS, Kim on 05/22/2018 14:19:48 [name] ([id number]) -------------------------------------------------------------------------------- Pain Assessment Details Patient Name: [name] Date of Service: 05/22/2018 1:45 PM Medical Record Number: [id number] Patient Account Number: [id number] Date of Birth/Sex: [birth year] (62 y.o. M) Treating RN: Huel Coventry Primary Care Jaquia Benedicto: Franco Nones Other  Clinician: Referring Deshana Rominger: Franco Nones Treating Kutler Vanvranken/Extender: Linwood Dibbles, HOYT Weeks in Treatment: 20 Active Problems Location of Pain Severity and Description of Pain Patient Has Paino No Site Locations Pain Management and Medication Current Pain Management: Electronic Signature(s) Signed: 05/22/2018 4:48:54 PM By: Elliot Gurney, BSN, RN, CWS, Kim RN, BSN Entered By: Elliot Gurney, BSN, RN, CWS, Kim on 05/22/2018 14:00:24 [name] ([id number]) -------------------------------------------------------------------------------- Patient/Caregiver Education Details Patient Name: [name] Date of Service: 05/22/2018 1:45 PM Medical Record Number: [id number] Patient Account Number: [id number] Date of Birth/Gender: 11/23/1955 (62 y.o. M) Treating RN: Huel Coventry Primary Care Physician: Franco Nones Other Clinician: Referring Physician: Franco Nones Treating Physician/Extender: Skeet Simmer in Treatment: 20 Education Assessment Education Provided To: Patient Education Topics Provided Offloading: Handouts: What is Offloadingo Methods: Demonstration, Explain/Verbal Responses: Return demonstration correctly Wound/Skin Impairment: Handouts: Caring for Your Ulcer Methods: Demonstration, Explain/Verbal Responses: State content correctly Electronic Signature(s) Signed: 05/22/2018 4:48:54 PM By: Elliot Gurney, BSN, RN, CWS, Kim RN, BSN Entered By: Elliot Gurney, BSN, RN, CWS, Kim on 05/22/2018 14:21:26 [name] ([id number]) -------------------------------------------------------------------------------- Wound Assessment Details Patient Name: [name] Date of Service: 05/22/2018 1:45 PM Medical Record Number: [id number] Patient Account Number: [id number] Date of Birth/Sex: 11-Jun-1956 (  [age] y.o. M) Treating RN: Huel CoventryWoody, Kim Primary Care Lilyanah Celestin: Franco NonesLINDLEY, CHERYL Other Clinician: Referring Dillyn Menna: Franco NonesLINDLEY, CHERYL Treating Blakeleigh Domek/Extender: STONE III, HOYT Weeks in  Treatment: 20 Wound Status Wound Number: 3 Primary Etiology: Pressure Ulcer Wound Location: Right Ischium Wound Status: Open Wounding Event: Pressure Injury Date Acquired: 12/15/2017 Weeks Of Treatment: 20 Clustered Wound: No Wound Measurements Length: (cm) 0.5 Width: (cm) 0.3 Depth: (cm) 0.1 Area: (cm) 0.118 Volume: (cm) 0.012 % Reduction in Area: 98.4% % Reduction in Volume: 98.4% Wound Description Classification: Category/Stage III Periwound Skin Texture Texture Color No Abnormalities Noted: No No Abnormalities Noted: No Moisture No Abnormalities Noted: No Treatment Notes Wound #3 (Right Ischium) 1. Cleansed with: Clean wound with Normal Saline 2. Anesthetic Topical Lidocaine 4% cream to wound bed prior to debridement 4. Dressing Applied: Hydrafera Blue 5. Secondary Dressing Applied Bordered Foam Dressing Electronic Signature(s) Signed: 05/22/2018 4:48:54 PM By: Elliot GurneyWoody, BSN, RN, CWS, Kim RN, BSN Entered By: Elliot GurneyWoody, BSN, RN, CWS, Kim on 05/22/2018 14:05:06 [name], [name] ([id number]) -------------------------------------------------------------------------------- Vitals Details Patient Name: [name], [name] Date of Service: 05/22/2018 1:45 PM Medical Record Number: [id number] Patient Account Number: [id number] Date of Birth/Sex: 04-Nov-1955 (62 y.o. M) Treating RN: Huel CoventryWoody, Kim Primary Care Kikuye Korenek: Franco NonesLINDLEY, CHERYL Other Clinician: Referring Marjean Imperato: Franco NonesLINDLEY, CHERYL Treating Kien Mirsky/Extender: STONE III, HOYT Weeks in Treatment: 20 Vital Signs Time Taken: 14:00 Temperature (F): 98.3 Height (in): 70 Pulse (bpm): 77 Weight (lbs): 155 Respiratory Rate (breaths/min): 16 Body Mass Index (BMI): 22.2 Blood Pressure (mmHg): 119/79 Reference Range: 80 - 120 mg / dl Electronic Signature(s) Signed: 05/22/2018 4:48:54 PM By: Elliot GurneyWoody, BSN, RN, CWS, Kim RN, BSN Entered By: Elliot GurneyWoody, BSN, RN, CWS, Kim on 05/22/2018 14:00:43

## 2018-05-24 NOTE — Progress Notes (Signed)
[name] ([id number]) Visit Report for 05/22/2018 Chief Complaint Document Details Patient Name: [name] Date of Service: 05/22/2018 1:45 PM Medical Record Number: [id number] Patient Account Number: [id number] Date of Birth/Sex: Apr 25, 1956 (62 y.o. M) Treating RN: Curtis Sites Primary Care Provider: Franco Nones Other Clinician: Referring Provider: Franco Nones Treating Provider/Extender: Linwood Dibbles, Arnold Kester Weeks in Treatment: 20 Information Obtained from: Patient Chief Complaint Bilateral Gluteal Ulcers Electronic Signature(s) Signed: 05/22/2018 5:50:45 PM By: Lenda Kelp PA-C Entered By: Lenda Kelp on 05/22/2018 13:59:17 [name] ([id number]) -------------------------------------------------------------------------------- HPI Details Patient Name: [name] Date of Service: 05/22/2018 1:45 PM Medical Record Number: [id number] Patient Account Number: [id number] Date of Birth/Sex: 1956/01/09 (62 y.o. M) Treating RN: Curtis Sites Primary Care Provider: Franco Nones Other Clinician: Referring Provider: Franco Nones Treating Provider/Extender: Linwood Dibbles, Jackye Dever Weeks in Treatment: 20 History of Present Illness HPI Description: 09/14/17-he is here for evaluation of the left lower extremity injury. He is a resident of a group home and is accompanied by facility staff. He went to his primary care on 2/12 for a left lower extremity wound. At that appointment a wound culture was taken. A referral for wound care services was ordered on 2/26 and according to facility med rec he was started on Bactrim on 3/5. The patient and facility staff are very poor historians. It is assumed that the injury to the left leg was from scratching. He presents today with no open area, no erythema, no evidence of drainage. He is going to PCP later today for follow up. Readmission: 01/02/18 on evaluation today patient presents for reevaluation in the clinic although this  is for a separate issue. He actually has several states to the pressure ulcers along his bilateral gluteal region which apparently have been present for several weeks now. He did see his primary care provider two weeks ago and they did initiate treatment. Home health was finally obtained although they were having a difficult time getting home health. It sounds like doing is actually used for the wound beds obviously don't think this is gonna be the best treatment option for him at this point. Fortunately there does not appear to be any evidence of infection at this time the patient does have discomfort but not as much as he apparently had in the past. When he was originally dealing with these before going to see his primary care provider the pain was much more significant. He does have a foam cushion for his wheelchair although I think being that he is wheelchair dependent he may benefit from a Roho cushion at this point. He also may benefit from an air mattress in my pinion he does have a hospital bed but again between the bed and his wheelchair he pretty much spends all of his time during the day and one of these two locations. I do think he staying too long in the wheelchair as far as sitting up as well which also I think needs to be addressed I did have a long conversation with him today concerning the fact that he needs to rotate positions and locations at least every two hours. This is something we're going to put on the notes as well to sin with him to the facility. I do think that this is going to be of utmost importance the dressings can be helpful but they are not gonna be able to manage this completely alone. He does have some hyper granular tissue noted. 01/09/18 on evaluation today patient presents for reevaluation concerning  multiple pressure ulcers noted of the bilateral gluteal region. He has been tolerating the dressing changes without complication the good news is he seems to be  making great progress at this point in time. There does not appear to be any evidence of infection at this time. No fevers, chills, nausea, or vomiting noted at this time. 01/23/18 Patient's wounds seem to show signs of good improvement at this point in time. He has been tolerating the dressing changes without complication. With that being said I do believe that he has been making excellent progress over the past several weeks. No fevers, chills, nausea, or vomiting noted at this time. 02/06/18 on evaluation today patient appears to be doing much better in regard to his gluteal and Ischial ulcers. He's been tolerating the dressing changes without complication the Hydrofera Blue Dressing be doing excellent for him. Overall I'm very pleased in this regard. He's having no significant pain. 03/07/18 on evaluation today patient appears to be doing much better in regard to his ulcers in the sacral, gluteal, Ischial locations. Overall in fact he only has two wounds remaining that appear to be open. This is the right gluteus and right ischial locations. Other than this everything that I have been managing up to this point appears greatly improved. Overall the patient states he's having minimal pain. He does have a foam cushion for his wheelchair. 03/22/18 on evaluation today patient actually appears to be doing much better in regard to his gluteal/Ischial wounds. In fact he just has one area remaining at this point in time in the right gluteal region. Fortunately he has made excellent progress at this point. [name] ([id number]) 04/10/18 on evaluation today patient actually appears to be doing worse compared to his previous evaluation. He tells me unfortunately that he has been doing a lot more sitting and getting in the bed last. I think this has directly led to a breakdown in the overall status of his wounds as well is pressure injury surrounding the wounds in the gluteal/sacral region in  general. Obviously this is not good and definitely something that we have been wanting to try to avoid. Subsequently he also tells me the home health is not been out changes dressings according to the patient since last Wednesday. 04/26/18 on evaluation today patient actually appears to be doing better in regard to his gluteal and Ischial wounds. He has been tolerating the dressing changes without complication. With that being said I do feel like he has been staying off of his bottom more at this point. It also does appear that he's in the process of potentially getting a new electric wheelchair and they are also looking for a potential better cushion for him as far as pressure relief is concerned at this point. Nonetheless I do believe that the patient has made excellent progress up to this point in the past two weeks period of Time. 05/08/18 on evaluation today patient actually appears to show signs of excellent improvement in general in regard to his ulcers in the gluteal region generally. He has been tolerating the dressing changes without complication. Fortunately there appears to be just one wound still open that he is dealing with at this point. Everything else has healed and has done extremely well. 05/22/18 on evaluation today patient actually appears to be doing very well in regard to his gluteal ulcers. In fact there's just one area remaining that has not completely healed currently. Overall I'm very pleased with the progress at this point.  Electronic Signature(s) Signed: 05/22/2018 5:50:45 PM By: Lenda Kelp PA-C Entered By: Lenda Kelp on 05/22/2018 14:33:56 [name] ([id number]) -------------------------------------------------------------------------------- Physical Exam Details Patient Name: [name] Date of Service: 05/22/2018 1:45 PM Medical Record Number: [id number] Patient Account Number: [id number] Date of Birth/Sex: 1956-03-31 (62 y.o. M) Treating RN:  Curtis Sites Primary Care Provider: Franco Nones Other Clinician: Referring Provider: Franco Nones Treating Provider/Extender: STONE III, Ula Couvillon Weeks in Treatment: 20 Constitutional Well-nourished and well-hydrated in no acute distress. Respiratory normal breathing without difficulty. clear to auscultation bilaterally. Cardiovascular regular rate and rhythm with normal S1, S2. Psychiatric this patient is able to make decisions and demonstrates good insight into disease process. Alert and Oriented x 3. pleasant and cooperative. Notes Patient's wound bed currently shows good granulation. There's no significant slough noted no sharp debridement necessary. Overall he has responded extremely well to the [facility] Dressing's. Again this is the last of the wounds to close and it was also the largest in the beginning. Electronic Signature(s) Signed: 05/22/2018 5:50:45 PM By: Lenda Kelp PA-C Entered By: Lenda Kelp on 05/22/2018 14:34:24 [name] ([id number]) -------------------------------------------------------------------------------- Physician Orders Details Patient Name: [name] Date of Service: 05/22/2018 1:45 PM Medical Record Number: [id number] Patient Account Number: [id number] Date of Birth/Sex: Jul 27, 1955 (62 y.o. 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 o Cleanse wound with mild soap and water o May Shower, gently pat wound dry prior to applying new dressing. Anesthetic (add to Medication List) Wound #3 Right Ischium o Topical Lidocaine 4% cream applied to wound bed prior to debridement (In Clinic Only). Primary Wound Dressing Wound #3 Right Ischium o Hydrafera Blue Ready Transfer Secondary Dressing Wound #3 Right Ischium o Boardered Foam Dressing Dressing Change Frequency Wound #3 Right Ischium o Change Dressing Monday, Wednesday, Friday - an as needed.Marland KitchenMarland KitchenHHRN to change the dressing for patient 3 times weekly because patient cannot change the dressing himself. Follow-up Appointments Wound #3 Right Ischium o Return Appointment in 2 weeks. Off-Loading Wound #3 Right Ischium o Turn and reposition every 2 hours [facility], Ekansh ([id number]) Additional Orders / Instructions Wound #3 Right Ischium o Increase protein intake. o Activity as tolerated Home Health Wound #3 Right Ischium o Continue Home Health Visits - [facility] - HHRN to change the dressing for patient 3 times weekly because patient cannot change the dressing himself. o Home Health Nurse may visit PRN to address patientos wound care needs. o FACE TO FACE ENCOUNTER: MEDICARE and MEDICAID PATIENTS: I certify that this patient is under my care and that I had a face-to-face encounter that meets the physician face-to-face encounter requirements with this patient on this date. The encounter with the patient was in whole or in part for the following MEDICAL CONDITION: (primary reason for Home Healthcare) MEDICAL NECESSITY: I certify, that based on my findings, NURSING services are a medically necessary home health service. HOME BOUND STATUS: I certify that my clinical findings support that this patient is homebound (i.e., Due to illness or injury, pt requires  aid of supportive devices such as crutches, cane, wheelchairs, walkers, the use of  special transportation or the assistance of another person to leave their place of residence. There is a normal inability to leave the home and doing so requires considerable and taxing effort. Other absences are for medical reasons / religious services and are infrequent or of short duration when for other reasons). o If current dressing causes regression in wound condition, may D/C ordered dressing product/s and apply Normal Saline Moist Dressing daily until next Wound Healing Center / Other MD appointment. Notify Wound Healing Center of regression in wound condition at [phone]. o Please direct any NON-WOUND related issues/requests for orders to patient's Primary Care Physician Electronic Signature(s) Signed: 05/22/2018 2:20:31 PM By: Elliot Gurney, BSN, RN, CWS, Kim RN, BSN Signed: 05/22/2018 5:50:45 PM By: Lenda Kelp PA-C Entered By: Elliot Gurney, BSN, RN, CWS, Kim on 05/22/2018 14:20:30 [name] ([id number]) -------------------------------------------------------------------------------- Problem List Details Patient Name: [name] Date of Service: 05/22/2018 1:45 PM Medical Record Number: [id number] Patient Account Number: [id number] Date of Birth/Sex: 03-10-1956 (62 y.o. 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 01/02/2018 No Yes F17.210 Nicotine dependence, cigarettes, uncomplicated 01/02/2018 No Yes K70.30 Alcoholic cirrhosis of liver without ascites 01/02/2018 No  Yes Inactive Problems Resolved Problems Electronic Signature(s) Signed: 05/22/2018 5:50:45 PM By: Lenda Kelp PA-C Entered By: Lenda Kelp on 05/22/2018 13:59:10 [name] ([id number]) -------------------------------------------------------------------------------- Progress Note Details Patient Name: [name] Date of Service: 05/22/2018 1:45 PM Medical Record Number: [id number] Patient Account Number: [id number] Date of Birth/Sex: 12/22/1955 (62 y.o. M) Treating RN: Curtis Sites Primary Care Provider: Franco Nones Other Clinician: Referring Provider: Franco Nones Treating Provider/Extender: Linwood Dibbles, Loman Logan Weeks in Treatment: 20 Subjective Chief Complaint Information obtained from Patient Bilateral Gluteal Ulcers History of Present Illness (HPI) 09/14/17-he is here for evaluation of the left lower extremity injury. He is a resident of a group home and is accompanied by facility staff. He went to his primary care on 2/12 for a left lower extremity wound. At that appointment a wound culture was taken. A referral for wound care services was ordered on 2/26 and according to facility med rec he was started on Bactrim on 3/5. The patient and facility staff are very poor historians. It is assumed that the injury to the left leg was from scratching. He presents today with no open area, no erythema, no evidence of drainage. He is going to PCP later today for follow up. Readmission: 01/02/18 on evaluation today patient presents for reevaluation in the clinic although this is for a separate issue. He actually has several states to the pressure ulcers along his bilateral gluteal region which apparently have been present for several weeks now. He did see his primary care provider two weeks ago and they did initiate treatment. Home health was finally obtained although they were having a difficult time getting home health. It sounds like doing is actually used for the wound  beds obviously don't think this is gonna be the best treatment option for him at this point. Fortunately there does not appear to  be any evidence of infection at this time the patient does have discomfort but not as much as he apparently had in the past. When he was originally dealing with these before going to see his primary care provider the pain was much more significant. He does have a foam cushion for his wheelchair although I think being that he is wheelchair dependent he may benefit from a Roho cushion at this point. He also may benefit from an air mattress in my pinion he does have a hospital bed but again between the bed and his wheelchair he pretty much spends all of his time during the day and one of these two locations. I do think he staying too long in the wheelchair as far as sitting up as well which also I think needs to be addressed I did have a long conversation with him today concerning the fact that he needs to rotate positions and locations at least every two hours. This is something we're going to put on the notes as well to sin with him to the facility. I do think that this is going to be of utmost importance the dressings can be helpful but they are not gonna be able to manage this completely alone. He does have some hyper granular tissue noted. 01/09/18 on evaluation today patient presents for reevaluation concerning multiple pressure ulcers noted of the bilateral gluteal region. He has been tolerating the dressing changes without complication the good news is he seems to be making great progress at this point in time. There does not appear to be any evidence of infection at this time. No fevers, chills, nausea, or vomiting noted at this time. 01/23/18 Patient's wounds seem to show signs of good improvement at this point in time. He has been tolerating the dressing changes without complication. With that being said I do believe that he has been making excellent progress over  the past several weeks. No fevers, chills, nausea, or vomiting noted at this time. 02/06/18 on evaluation today patient appears to be doing much better in regard to his gluteal and Ischial ulcers. He's been tolerating the dressing changes without complication the Hydrofera Blue Dressing be doing excellent for him. Overall I'm very pleased in this regard. He's having no significant pain. 03/07/18 on evaluation today patient appears to be doing much better in regard to his ulcers in the sacral, gluteal, Ischial locations. Overall in fact he only has two wounds remaining that appear to be open. This is the right gluteus and right ischial locations. Other than this everything that I have been managing up to this point appears greatly improved. Overall the patient states he's having minimal pain. He does have a foam cushion for his wheelchair. SEWELL, PITNER ([id number]) 03/22/18 on evaluation today patient actually appears to be doing much better in regard to his gluteal/Ischial wounds. In fact he just has one area remaining at this point in time in the right gluteal region. Fortunately he has made excellent progress at this point. 04/10/18 on evaluation today patient actually appears to be doing worse compared to his previous evaluation. He tells me unfortunately that he has been doing a lot more sitting and getting in the bed last. I think this has directly led to a breakdown in the overall status of his wounds as well is pressure injury surrounding the wounds in the gluteal/sacral region in general. Obviously this is not good and definitely something that we have been wanting to try to avoid. Subsequently he also  tells me the home health is not been out changes dressings according to the patient since last Wednesday. 04/26/18 on evaluation today patient actually appears to be doing better in regard to his gluteal and Ischial wounds. He has been tolerating the dressing changes without complication. With  that being said I do feel like he has been staying off of his bottom more at this point. It also does appear that he's in the process of potentially getting a new electric wheelchair and they are also looking for a potential better cushion for him as far as pressure relief is concerned at this point. Nonetheless I do believe that the patient has made excellent progress up to this point in the past two weeks period of Time. 05/08/18 on evaluation today patient actually appears to show signs of excellent improvement in general in regard to his ulcers in the gluteal region generally. He has been tolerating the dressing changes without complication. Fortunately there appears to be just one wound still open that he is dealing with at this point. Everything else has healed and has done extremely well. 05/22/18 on evaluation today patient actually appears to be doing very well in regard to his gluteal ulcers. In fact there's just one area remaining that has not completely healed currently. Overall I'm very pleased with the progress at this point. Patient History Information obtained from Patient. Family History Heart Disease - Siblings, Hypertension - Siblings, Lung Disease - Siblings, No family history of Cancer, Diabetes, Hereditary Spherocytosis, Kidney Disease, Seizures, Stroke, Thyroid Problems, Tuberculosis. Social History Current every day smoker, Marital Status - Single, Alcohol Use - Never - history of ETOH abuse, Drug Use - Prior History, Caffeine Use - Daily. Medical And Surgical History Notes Gastrointestinal Colostomy Musculoskeletal R AKA Review of Systems (ROS) Constitutional Symptoms (General Health) Denies complaints or symptoms of Fever, Chills. Respiratory The patient has no complaints or symptoms. Cardiovascular The patient has no complaints or symptoms. Psychiatric The patient has no complaints or symptoms. Shawn SchlichterRANDLES, Loy  ([id number]) Objective Constitutional Well-nourished and well-hydrated in no acute distress. Vitals Time Taken: 2:00 PM, Height: 70 in, Weight: 155 lbs, BMI: 22.2, Temperature: 98.3 F, Pulse: 77 bpm, Respiratory Rate: 16 breaths/min, Blood Pressure: 119/79 mmHg. Respiratory normal breathing without difficulty. clear to auscultation bilaterally. Cardiovascular regular rate and rhythm with normal S1, S2. Psychiatric this patient is able to make decisions and demonstrates good insight into disease process. Alert and Oriented x 3. pleasant and cooperative. General Notes: Patient's wound bed currently shows good granulation. There's no significant slough noted no sharp debridement necessary. Overall he has responded extremely well [facility]. Again this is the last of the wounds to close and it was also the largest in the beginning. Integumentary (Hair, Skin) Wound #3 status is Open. Original cause of wound was Pressure Injury. The wound is located on the Right Ischium. The wound measures 0.5cm length x 0.3cm width x 0.1cm depth; 0.118cm^2 area and 0.012cm^3 volume. 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 Cleanse wound with mild soap and water Shawn SchlichterRANDLES, Nakeem ([id number]) May Shower, gently pat wound dry prior to applying new dressing. Anesthetic (add to Medication List): Wound #3 Right Ischium: Topical Lidocaine 4% cream applied to wound bed prior  to debridement (In Clinic Only). Primary Wound Dressing: Wound #3 Right Ischium: Hydrafera Blue Ready Transfer Secondary Dressing: Wound #3 Right Ischium: Boardered Foam Dressing Dressing  Change Frequency: Wound #3 Right Ischium: Change Dressing Monday, Wednesday, Friday - an as needed.Marland KitchenMarland KitchenHHRN to change the dressing for patient 3 times weekly because patient cannot change the dressing himself. Follow-up Appointments: Wound #3 Right Ischium: Return Appointment in 2 weeks. Off-Loading: Wound #3 Right Ischium: Turn and reposition every 2 hours Additional Orders / Instructions: Wound #3 Right Ischium: Increase protein intake. Activity as tolerated Home Health: Wound #3 Right Ischium: Continue Home Health Visits - [facility] - HHRN to change the dressing for patient 3 times weekly because patient cannot change the dressing himself. Home Health Nurse may visit PRN to address patient s wound care needs. FACE TO FACE ENCOUNTER: MEDICARE and MEDICAID PATIENTS: I certify that this patient is under my care and that I had a face-to-face encounter that meets the physician face-to-face encounter requirements with this patient on this date. The encounter with the patient was in whole or in part for the following MEDICAL CONDITION: (primary reason for Home Healthcare) MEDICAL NECESSITY: I certify, that based on my findings, NURSING services are a medically necessary home health service. HOME BOUND STATUS: I certify that my clinical findings support that this patient is homebound (i.e., Due to illness or injury, pt requires aid of supportive devices such as crutches, cane, wheelchairs, walkers, the use of special transportation or the assistance of another person to leave their place of residence. There is a normal inability to leave the home and doing so requires considerable and taxing effort. Other absences are for medical reasons / religious services and are infrequent or of short duration when for other reasons). If current dressing causes regression in wound condition, may D/C ordered dressing product/s and apply Normal Saline Moist Dressing daily until next Wound Healing Center /  Other MD appointment. Notify Wound Healing Center of regression in wound condition at [phone]. Please direct any NON-WOUND related issues/requests for orders to patient's Primary Care Physician I'm in a recommend that we continue with the above wound care measures for the next week. The patient is in agreement with plan. If anything changes or or worsens in the interim he will contact the office and let me know otherwise will see him back for follow-up her below. Please see above for specific wound care orders. We will see patient for re-evaluation in 2 week(s) here in the clinic. If anything worsens or changes patient will contact our office for additional recommendations. Electronic Signature(s) Signed: 05/22/2018 5:50:45 PM By: Lenda Kelp PA-C Entered By: Lenda Kelp on 05/22/2018 14:34:58 DAMEL, QUERRY ([id number]) PILAR, CORRALES ([id number]) -------------------------------------------------------------------------------- ROS/PFSH Details Patient Name: [name] Date of Service: 05/22/2018 1:45 PM Medical Record Number: [id number] Patient Account Number: [id number] Date of Birth/Sex: 02-25-1956 (62 y.o. M) Treating RN: Curtis Sites Primary Care Provider: Franco Nones Other Clinician: Referring Provider: Franco Nones Treating Provider/Extender: STONE III, Conor Lata Weeks in Treatment: 20 Information Obtained From Patient Wound History Do you currently have one or more open woundso Yes Approximately how long have you had your woundso 2 weeks How have you been treating your wound(s) until nowo Deuoderm Has your wound(s) ever healed and then re-openedo No Have you had any lab work done in the past montho No Have you tested positive for an antibiotic resistant organism (MRSA, VRE)o No Have you tested positive for osteomyelitis (bone infection)o No Have you had any tests for  circulation on your legso No Constitutional Symptoms (General Health) Complaints and  Symptoms: Negative for: Fever; Chills Eyes Medical History: Negative for: Cataracts; Glaucoma; Optic Neuritis Ear/Nose/Mouth/Throat Medical History: Positive for: Chronic sinus problems/congestion Negative for: Middle ear problems Hematologic/Lymphatic Medical History: Positive for: Anemia Negative for: Hemophilia; Human Immunodeficiency Virus; Lymphedema; Sickle Cell Disease Respiratory Complaints and Symptoms: No Complaints or Symptoms Medical History: Negative for: Aspiration; Asthma; Chronic Obstructive Pulmonary Disease (COPD); Pneumothorax; Sleep Apnea; Tuberculosis Cardiovascular Complaints and Symptoms: No Complaints or Symptoms Medical History: Positive for: Arrhythmia Stankovic, Viyaan ([id number]) Negative for: Angina; Congestive Heart Failure; Coronary Artery Disease; Deep Vein Thrombosis; Hypertension; Hypotension; Myocardial Infarction; Peripheral Arterial Disease; Peripheral Venous Disease; Phlebitis; Vasculitis Gastrointestinal Medical History: Positive for: Hepatitis C Negative for: Cirrhosis ; Colitis; Crohnos; Hepatitis A; Hepatitis B Past Medical History Notes: Colostomy Endocrine Medical History: Positive for: Type II Diabetes Time with diabetes: 1 year Treated with: Insulin Blood sugar tested every day: Yes Tested : Genitourinary Medical History: Negative for: End Stage Renal Disease Immunological Medical History: Negative for: Lupus Erythematosus; Raynaudos; Scleroderma Musculoskeletal Medical History: Past Medical History Notes: R AKA Neurologic Medical History: Negative for: Dementia; Neuropathy; Quadriplegia; Paraplegia; Seizure Disorder Psychiatric Complaints and Symptoms: No Complaints or Symptoms HBO Extended History Items Ear/Nose/Mouth/Throat: Chronic sinus problems/congestion Immunizations Pneumococcal Vaccine: Received Pneumococcal Vaccination: Yes Immunization Notes: up to date Implantable Devices Family and Social  History KAMDYN, COLBORN ([id number]) Cancer: No; Diabetes: No; Heart Disease: Yes - Siblings; Hereditary Spherocytosis: No; Hypertension: Yes - Siblings; Kidney Disease: No; Lung Disease: Yes - Siblings; Seizures: No; Stroke: No; Thyroid Problems: No; Tuberculosis: No; Current every day smoker; Marital Status - Single; Alcohol Use: Never - history of ETOH abuse; Drug Use: Prior History; Caffeine Use: Daily; Financial Concerns: No; Food, Clothing or Shelter Needs: No; Support System Lacking: No; Transportation Concerns: No; Advanced Directives: No; Patient does not want information on Advanced Directives Physician Affirmation I have reviewed and agree with the above information. Electronic Signature(s) Signed: 05/22/2018 5:03:03 PM By: Curtis Sites Signed: 05/22/2018 5:50:45 PM By: Lenda Kelp PA-C Entered By: Lenda Kelp on 05/22/2018 14:34:09 [name] ([id number]) -------------------------------------------------------------------------------- SuperBill Details Patient Name: [name] Date of Service: 05/22/2018 Medical Record Number: [id number] Patient Account Number: [id number] Date of Birth/Sex: April 02, 1956 (62 y.o. 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

## 2018-06-05 ENCOUNTER — Encounter: Payer: Medicaid Other | Admitting: Family Medicine

## 2018-06-05 DIAGNOSIS — L89313 Pressure ulcer of right buttock, stage 3: Secondary | ICD-10-CM | POA: Diagnosis not present

## 2018-06-11 NOTE — Progress Notes (Addendum)
DESMUND, ELMAN ([id number]) Visit Report for 06/05/2018 Chief Complaint Document Details Patient Name: [name], [name] Date of Service: 06/05/2018 2:15 PM Medical Record Number: [id number] Patient Account Number: [id number] Date of Birth/Sex: May 03, 1956 (62 y.o. M) Treating RN: Curtis Sites Primary Care Provider: Franco Nones Other Clinician: Referring Provider: Franco Nones Treating Provider/Extender: Youlanda Roys in Treatment: 22 Information Obtained from: Patient Chief Complaint Bilateral Gluteal Ulcers Electronic Signature(s) Signed: 06/10/2018 5:29:42 PM By: Wilkie Aye FNP-C Entered By: Wilkie Aye on 06/05/2018 15:16:12 [name] ([id number]) -------------------------------------------------------------------------------- HPI Details Patient Name: [name] Date of Service: 06/05/2018 2:15 PM Medical Record Number: [id number] Patient Account Number: [id number] Date of Birth/Sex: 1955-10-20 (62 y.o. M) Treating RN: Curtis Sites Primary Care Provider: Franco Nones Other Clinician: Referring Provider: Franco Nones Treating Provider/Extender: Youlanda Roys in Treatment: 22 History of Present Illness HPI Description: 09/14/17-he is here for evaluation of the left lower extremity injury. He is a resident of a group home and is accompanied by facility staff. He went to his primary care on 2/12 for a left lower extremity wound. At that appointment a wound culture was taken. A referral for wound care services was ordered on 2/26 and according to facility med rec he was started on Bactrim on 3/5. The patient and facility staff are very poor historians. It is assumed that the injury to the left leg was from scratching. He presents today with no open area, no erythema, no evidence of drainage. He is going to PCP later today for follow up. Readmission: 01/02/18 on evaluation today patient presents for reevaluation in the clinic although this  is for a separate issue. He actually has several states to the pressure ulcers along his bilateral gluteal region which apparently have been present for several weeks now. He did see his primary care provider two weeks ago and they did initiate treatment. Home health was finally obtained although they were having a difficult time getting home health. It sounds like doing is actually used for the wound beds obviously don't think this is gonna be the best treatment option for him at this point. Fortunately there does not appear to be any evidence of infection at this time the patient does have discomfort but not as much as he apparently had in the past. When he was originally dealing with these before going to see his primary care provider the pain was much more significant. He does have a foam cushion for his wheelchair although I think being that he is wheelchair dependent he may benefit from a Roho cushion at this point. He also may benefit from an air mattress in my pinion he does have a hospital bed but again between the bed and his wheelchair he pretty much spends all of his time during the day and one of these two locations. I do think he staying too long in the wheelchair as far as sitting up as well which also I think needs to be addressed I did have a long conversation with him today concerning the fact that he needs to rotate positions and locations at least every two hours. This is something we're going to put on the notes as well to sin with him to the facility. I do think that this is going to be of utmost importance the dressings can be helpful but they are not gonna be able to manage this completely alone. He does have some hyper granular tissue noted. 01/09/18 on evaluation today patient presents for reevaluation concerning multiple pressure ulcers noted  of the bilateral gluteal region. He has been tolerating the dressing changes without complication the good news is he seems to be  making great progress at this point in time. There does not appear to be any evidence of infection at this time. No fevers, chills, nausea, or vomiting noted at this time. 01/23/18 Patient's wounds seem to show signs of good improvement at this point in time. He has been tolerating the dressing changes without complication. With that being said I do believe that he has been making excellent progress over the past several weeks. No fevers, chills, nausea, or vomiting noted at this time. 02/06/18 on evaluation today patient appears to be doing much better in regard to his gluteal and Ischial ulcers. He's been tolerating the dressing changes without complication the Hydrofera Blue Dressing be doing excellent for him. Overall I'm very pleased in this regard. He's having no significant pain. 03/07/18 on evaluation today patient appears to be doing much better in regard to his ulcers in the sacral, gluteal, Ischial locations. Overall in fact he only has two wounds remaining that appear to be open. This is the right gluteus and right ischial locations. Other than this everything that I have been managing up to this point appears greatly improved. Overall the patient states he's having minimal pain. He does have a foam cushion for his wheelchair. 03/22/18 on evaluation today patient actually appears to be doing much better in regard to his gluteal/Ischial wounds. In fact he just has one area remaining at this point in time in the right gluteal region. Fortunately he has made excellent progress at this point. Shawn SchlichterRANDLES, [name] ([id number]) 04/10/18 on evaluation today patient actually appears to be doing worse compared to his previous evaluation. He tells me unfortunately that he has been doing a lot more sitting and getting in the bed last. I think this has directly led to a breakdown in the overall status of his wounds as well is pressure injury surrounding the wounds in the gluteal/sacral region in  general. Obviously this is not good and definitely something that we have been wanting to try to avoid. Subsequently he also tells me the home health is not been out changes dressings according to the patient since last Wednesday. 04/26/18 on evaluation today patient actually appears to be doing better in regard to his gluteal and Ischial wounds. He has been tolerating the dressing changes without complication. With that being said I do feel like he has been staying off of his bottom more at this point. It also does appear that he's in the process of potentially getting a new electric wheelchair and they are also looking for a potential better cushion for him as far as pressure relief is concerned at this point. Nonetheless I do believe that the patient has made excellent progress up to this point in the past two weeks period of Time. 05/08/18 on evaluation today patient actually appears to show signs of excellent improvement in general in regard to his ulcers in the gluteal region generally. He has been tolerating the dressing changes without complication. Fortunately there appears to be just one wound still open that he is dealing with at this point. Everything else has healed and has done extremely well. 05/22/18 on evaluation today patient actually appears to be doing very well in regard to his gluteal ulcers. In fact there's just one area remaining that has not completely healed currently. Overall I'm very pleased with the progress at this point. 06/05/2018 patient seen today  for follow-up management of gluteal ulcers. At this time the wounds have completely healed. There still remains to be some surrounding erythema. No drainage noted. He denies any recent fevers, chills, or shortness of breath. He had a caregiver at his bedside and explained aftercare and when to follow up with the wound Center in the event the gluteal ulcers return. Electronic Signature(s) Signed: 07/05/2018 12:41:30 AM  By: Wilkie Aye FNP-C Previous Signature: 06/10/2018 5:29:42 PM Version By: Wilkie Aye FNP-C Entered By: Wilkie Aye on 07/05/2018 00:37:54 [name] ([id number]) -------------------------------------------------------------------------------- Physical Exam Details Patient Name: [name] Date of Service: 06/05/2018 2:15 PM Medical Record Number: [id number] Patient Account Number: [id number] Date of Birth/Sex: 06-Apr-1956 (62 y.o. M) Treating RN: Curtis Sites Primary Care Provider: Franco Nones Other Clinician: Referring Provider: Franco Nones Treating Provider/Extender: Youlanda Roys in Treatment: 22 Constitutional . appears in no distress. Respiratory Respiratory effort is easy and symmetric bilaterally. Rate is normal at rest and on room air.. Cardiovascular Extremities are free of edema.. Integumentary (Hair, Skin) Skin and subcutaneous tissue without rashes, lesions, discoloration or ulcers. No evidence of fungal disease. Hair and skin color texture normal and healthy in appearance.. thin skin sacrum. Notes 06/05/18- wound healed Electronic Signature(s) Signed: 06/10/2018 5:29:42 PM By: Wilkie Aye FNP-C Entered By: Wilkie Aye on 06/05/2018 15:22:04 [name] ([id number]) -------------------------------------------------------------------------------- Physician Orders Details Patient Name: [name] Date of Service: 06/05/2018 2:15 PM Medical Record Number: [id number] Patient Account Number: [id number] Date of Birth/Sex: [birth year] (62 y.o. M) Treating RN: Curtis Sites Primary Care Provider: Franco Nones Other Clinician: Referring Provider: Franco Nones Treating Provider/Extender: Youlanda Roys in Treatment: 22 Verbal / Phone Orders: No Diagnosis Coding Discharge From [facility] Services o Discharge from Wound Care Center - continue protecting this area with bordered foam dressing for 2 weeks  and continue keeping pressure off your bottom Electronic Signature(s) Signed: 06/05/2018 5:02:25 PM By: Curtis Sites Signed: 06/10/2018 5:29:42 PM By: Wilkie Aye FNP-C Entered By: Curtis Sites on 06/05/2018 14:33:44 [name] ([id number]) -------------------------------------------------------------------------------- Problem List Details Patient Name: [name] Date of Service: 06/05/2018 2:15 PM Medical Record Number: [id number] Patient Account Number: [id number] Date of Birth/Sex: [birth year] (62 y.o. 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 01/02/2018 No Yes F17.210 Nicotine dependence, cigarettes, uncomplicated 01/02/2018 No Yes K70.30 Alcoholic cirrhosis of liver without ascites 01/02/2018 No Yes Inactive Problems Resolved Problems Electronic Signature(s) Signed: 06/10/2018 5:29:42 PM By: Wilkie Aye FNP-C Entered By: Wilkie Aye on 06/05/2018 15:29:39 [name] ([id number]) -------------------------------------------------------------------------------- Progress Note Details Patient Name: [name] Date of Service: 06/05/2018 2:15 PM Medical Record Number: [id number] Patient Account Number: [id number] Date of Birth/Sex: 12/12/1955 (62 y.o. M) Treating RN: Curtis Sites Primary Care Provider: Franco Nones Other Clinician: Referring Provider: Franco Nones Treating Provider/Extender: Youlanda Roys in Treatment:  22 Subjective Chief Complaint Information obtained from Patient Bilateral Gluteal Ulcers History of Present Illness (HPI) 09/14/17-he is here for evaluation of the left lower extremity injury. He is a resident of a group home and is accompanied by facility staff. He went to his primary care on 2/12 for a left lower  extremity wound. At that appointment a wound culture was taken. A referral for wound care services was ordered on 2/26 and according to facility med rec he was started on Bactrim on 3/5. The patient and facility staff are very poor historians. It is assumed that the injury to the left leg was from scratching. He presents today with no open area, no erythema, no evidence of drainage. He is going to PCP later today for follow up. Readmission: 01/02/18 on evaluation today patient presents for reevaluation in the clinic although this is for a separate issue. He actually has several states to the pressure ulcers along his bilateral gluteal region which apparently have been present for several weeks now. He did see his primary care provider two weeks ago and they did initiate treatment. Home health was finally obtained although they were having a difficult time getting home health. It sounds like doing is actually used for the wound beds obviously don't think this is gonna be the best treatment option for him at this point. Fortunately there does not appear to be any evidence of infection at this time the patient does have discomfort but not as much as he apparently had in the past. When he was originally dealing with these before going to see his primary care provider the pain was much more significant. He does have a foam cushion for his wheelchair although I think being that he is wheelchair dependent he may benefit from a Roho cushion at this point. He also may benefit from an air mattress in my pinion he does have a hospital bed but again between the bed and his wheelchair he pretty much  spends all of his time during the day and one of these two locations. I do think he staying too long in the wheelchair as far as sitting up as well which also I think needs to be addressed I did have a long conversation with him today concerning the fact that he needs to rotate positions and locations at least every two hours. This is something we're going to put on the notes as well to sin with him to the facility. I do think that this is going to be of utmost importance the dressings can be helpful but they are not gonna be able to manage this completely alone. He does have some hyper granular tissue noted. 01/09/18 on evaluation today patient presents for reevaluation concerning multiple pressure ulcers noted of the bilateral gluteal region. He has been tolerating the dressing changes without complication the good news is he seems to be making great progress at this point in time. There does not appear to be any evidence of infection at this time. No fevers, chills, nausea, or vomiting noted at this time. 01/23/18 Patient's wounds seem to show signs of good improvement at this point in time. He has been tolerating the dressing changes without complication. With that being said I do believe that he has been making excellent progress over the past several weeks. No fevers, chills, nausea, or vomiting noted at this time. 02/06/18 on evaluation today patient appears to be doing much better in regard to his gluteal and Ischial ulcers. He's been tolerating the dressing changes without complication the Hydrofera Blue Dressing be doing excellent for him. Overall I'm very pleased in this regard. He's having no significant pain. 03/07/18 on evaluation today patient appears to be doing much better in regard to his ulcers in the sacral, gluteal, Ischial locations. Overall in fact he only has  two wounds remaining that appear to be open. This is the right gluteus and right ischial locations. Other than this  everything that I have been managing up to this point appears greatly improved. Overall the patient states he's having minimal pain. He does have a foam cushion for his wheelchair. [name], [name] ([id number]) 03/22/18 on evaluation today patient actually appears to be doing much better in regard to his gluteal/Ischial wounds. In fact he just has one area remaining at this point in time in the right gluteal region. Fortunately he has made excellent progress at this point. 04/10/18 on evaluation today patient actually appears to be doing worse compared to his previous evaluation. He tells me unfortunately that he has been doing a lot more sitting and getting in the bed last. I think this has directly led to a breakdown in the overall status of his wounds as well is pressure injury surrounding the wounds in the gluteal/sacral region in general. Obviously this is not good and definitely something that we have been wanting to try to avoid. Subsequently he also tells me the home health is not been out changes dressings according to the patient since last Wednesday. 04/26/18 on evaluation today patient actually appears to be doing better in regard to his gluteal and Ischial wounds. He has been tolerating the dressing changes without complication. With that being said I do feel like he has been staying off of his bottom more at this point. It also does appear that he's in the process of potentially getting a new electric wheelchair and they are also looking for a potential better cushion for him as far as pressure relief is concerned at this point. Nonetheless I do believe that the patient has made excellent progress up to this point in the past two weeks period of Time. 05/08/18 on evaluation today patient actually appears to show signs of excellent improvement in general in regard to his ulcers in the gluteal region generally. He has been tolerating the dressing changes without complication. Fortunately  there appears to be just one wound still open that he is dealing with at this point. Everything else has healed and has done extremely well. 05/22/18 on evaluation today patient actually appears to be doing very well in regard to his gluteal ulcers. In fact there's just one area remaining that has not completely healed currently. Overall I'm very pleased with the progress at this point. 06/05/2018 patient seen today for follow-up management of gluteal ulcers. At this time the wounds have completely healed. There still remains to be some surrounding erythema. No drainage noted. He denies any recent fevers, chills, or shortness of breath. He had a caregiver at his bedside and explained aftercare and when to follow up with the wound Center in the event the gluteal ulcers return. Patient History Information obtained from Patient. Family History Heart Disease - Siblings, Hypertension - Siblings, Lung Disease - Siblings, No family history of Cancer, Diabetes, Hereditary Spherocytosis, Kidney Disease, Seizures, Stroke, Thyroid Problems, Tuberculosis. Social History Current every day smoker, Marital Status - Single, Alcohol Use - Never - history of ETOH abuse, Drug Use - Prior History, Caffeine Use - Daily. Medical History Integumentary (Skin) Patient has history of History of pressure wounds - sacrum Medical And Surgical History Notes Gastrointestinal Colostomy Musculoskeletal R AKA Review of Systems (ROS) Constitutional Symptoms (General Health) The patient has no complaints or symptoms. Respiratory The patient has no complaints or symptoms. Cardiovascular The patient has no complaints or symptoms. [name], [name] ([id number]) Integumentary (  Skin) The patient has no complaints or symptoms. Objective Constitutional appears in no distress. Vitals Time Taken: 2:17 PM, Height: 70 in, Weight: 155 lbs, BMI: 22.2, Temperature: 98.6 F, Pulse: 89 bpm, Respiratory Rate: 16 breaths/min, Blood  Pressure: 140/85 mmHg. Respiratory Respiratory effort is easy and symmetric bilaterally. Rate is normal at rest and on room air.. Cardiovascular Extremities are free of edema.. General Notes: 06/05/18- wound healed Integumentary (Hair, Skin) Skin and subcutaneous tissue without rashes, lesions, discoloration or ulcers. No evidence of fungal disease. Hair and skin color texture normal and healthy in appearance.. thin skin sacrum. Wound #3 status is Healed - Epithelialized. Original cause of wound was Pressure Injury. The wound is located on the Right Ischium. The wound measures 0cm length x 0cm width x 0cm depth; 0cm^2 area and 0cm^3 volume. There is no tunneling or undermining noted. There is a none present amount of drainage noted. The wound margin is indistinct and nonvisible. There is no granulation within the wound bed. There is no necrotic tissue within the wound bed. 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 Nicotine dependence, cigarettes, uncomplicated Alcoholic cirrhosis of liver without ascites [name], [name] ([id number]) Plan Discharge From [facility] Services: Discharge from Wound Care Center - continue protecting this area with bordered foam dressing for 2 weeks and continue keeping pressure off your bottom Electronic Signature(s) Signed: 07/05/2018 12:38:27 AM By: Wilkie Aye FNP-C Previous Signature: 06/10/2018 5:29:42 PM Version By: Wilkie Aye FNP-C Entered By: Wilkie Aye on 07/05/2018 00:38:27 [name] ([id number]) -------------------------------------------------------------------------------- ROS/PFSH Details Patient Name: [name] Date of Service: 06/05/2018 2:15 PM Medical Record Number: [id number] Patient Account Number: [id number] Date of Birth/Sex: 1956-04-10 (62 y.o. M) Treating RN:  Curtis Sites Primary Care Provider: Franco Nones Other Clinician: Referring Provider: Franco Nones Treating Provider/Extender: Youlanda Roys in Treatment: 22 Information Obtained From Patient Wound History Do you currently have one or more open woundso Yes Approximately how long have you had your woundso 2 weeks How have you been treating your wound(s) until nowo Deuoderm Has your wound(s) ever healed and then re-openedo No Have you had any lab work done in the past montho No Have you tested positive for an antibiotic resistant organism (MRSA, VRE)o No Have you tested positive for osteomyelitis (bone infection)o No Have you had any tests for circulation on your legso No Constitutional Symptoms (General Health) Complaints and Symptoms: No Complaints or Symptoms Eyes Medical History: Negative for: Cataracts; Glaucoma; Optic Neuritis Ear/Nose/Mouth/Throat Medical History: Positive for: Chronic sinus problems/congestion Negative for: Middle ear problems Hematologic/Lymphatic Medical History: Positive for: Anemia Negative for: Hemophilia; Human Immunodeficiency Virus; Lymphedema; Sickle Cell Disease Respiratory Complaints and Symptoms: No Complaints or Symptoms Medical History: Negative for: Aspiration; Asthma; Chronic Obstructive Pulmonary Disease (COPD); Pneumothorax; Sleep Apnea; Tuberculosis Cardiovascular Complaints and Symptoms: No Complaints or Symptoms Medical History: [name], [name] ([id number]) Positive for: Arrhythmia Negative for: Angina; Congestive Heart Failure; Coronary Artery Disease; Deep Vein Thrombosis; Hypertension; Hypotension; Myocardial Infarction; Peripheral Arterial Disease; Peripheral Venous Disease; Phlebitis; Vasculitis Gastrointestinal Medical History: Positive for: Hepatitis C Negative for: Cirrhosis ; Colitis; Crohnos; Hepatitis A; Hepatitis B Past Medical History Notes: Colostomy Endocrine Medical History: Positive for:  Type II Diabetes Time with diabetes: 1 year Treated with: Insulin Blood sugar tested every day: Yes Tested : Genitourinary Medical History: Negative for: End Stage Renal Disease Immunological Medical History: Negative for: Lupus Erythematosus; Raynaudos; Scleroderma Integumentary (  Skin) Complaints and Symptoms: No Complaints or Symptoms Medical History: Positive for: History of pressure wounds - sacrum Musculoskeletal Medical History: Past Medical History Notes: R AKA Neurologic Medical History: Negative for: Dementia; Neuropathy; Quadriplegia; Paraplegia; Seizure Disorder HBO Extended History Items Ear/Nose/Mouth/Throat: Chronic sinus problems/congestion Immunizations Pneumococcal Vaccine: Received Pneumococcal Vaccination: Yes Immunization Notes: [name], [name] ([id number]) up to date Implantable Devices Family and Social History Cancer: No; Diabetes: No; Heart Disease: Yes - Siblings; Hereditary Spherocytosis: No; Hypertension: Yes - Siblings; Kidney Disease: No; Lung Disease: Yes - Siblings; Seizures: No; Stroke: No; Thyroid Problems: No; Tuberculosis: No; Current every day smoker; Marital Status - Single; Alcohol Use: Never - history of ETOH abuse; Drug Use: Prior History; Caffeine Use: Daily; Financial Concerns: No; Food, Clothing or Shelter Needs: No; Support System Lacking: No; Transportation Concerns: No; Advanced Directives: No; Patient does not want information on Advanced Directives Physician Affirmation I have reviewed and agree with the above information. Electronic Signature(s) Signed: 06/05/2018 5:02:25 PM By: Curtis Sites Signed: 06/10/2018 5:29:42 PM By: Wilkie Aye FNP-C Entered By: Wilkie Aye on 06/05/2018 15:20:14 [name] ([id number]) -------------------------------------------------------------------------------- SuperBill Details Patient Name: [name] Date of Service: 06/05/2018 Medical Record Number: [id number] Patient  Account Number: [id number] Date of Birth/Sex: 1955-08-28 (62 y.o. 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

## 2018-06-11 NOTE — Progress Notes (Signed)
[name], [name] ([id number]) Visit Report for 06/05/2018 Arrival Information Details Patient Name: Shawn, Higgins Date of Service: 06/05/2018 2:15 PM Medical Record Number: [id number] Patient Account Number: [id number] Date of Birth/Sex: [birth year] (62 y.o. M) Treating RN: Huel Coventry Primary Care Zylen Wenig: Franco Nones Other Clinician: Referring Maurilio Puryear: Franco Nones Treating Morton Simson/Extender: Youlanda Roys in Treatment: 22 Visit Information History Since Last Visit Added or deleted any medications: No Patient Arrived: Wheel Chair Any new allergies or adverse reactions: No Arrival Time: 14:17 Had a fall or experienced change in No activities of daily living that may affect Accompanied By: caregiver risk of falls: Transfer Assistance: Manual Signs or symptoms of abuse/neglect since last visito No Patient Identification Verified: Yes Hospitalized since last visit: No Secondary Verification Process Completed: Yes Implantable device outside of the clinic excluding No Patient Requires Transmission-Based No cellular tissue based products placed in the center Precautions: since last visit: Patient Has Alerts: No Has Dressing in Place as Prescribed: Yes Pain Present Now: No Electronic Signature(s) Signed: 06/05/2018 4:12:20 PM By: Elliot Gurney, BSN, RN, CWS, Kim RN, BSN Entered By: Elliot Gurney, BSN, RN, CWS, Kim on 06/05/2018 14:17:30 [name] ([id number]) -------------------------------------------------------------------------------- Clinic Level of Care Assessment Details Patient Name: [name] Date of Service: 06/05/2018 2:15 PM Medical Record Number: [id number] Patient Account Number: [id number] Date of Birth/Sex: 04/14/1956 (62 y.o. M) Treating RN: Curtis Sites Primary Care Macklin Jacquin: Franco Nones Other Clinician: Referring Jozi Malachi: Franco Nones Treating Dan Scearce/Extender: Youlanda Roys in Treatment: 22 Clinic Level of Care Assessment  Items TOOL 4 Quantity Score []  - Use when only an EandM is performed on FOLLOW-UP visit 0 ASSESSMENTS - Nursing Assessment / Reassessment X - Reassessment of Co-morbidities (includes updates in patient status) 1 10 X- 1 5 Reassessment of Adherence to Treatment Plan ASSESSMENTS - Wound and Skin Assessment / Reassessment X - Simple Wound Assessment / Reassessment - one wound 1 5 []  - 0 Complex Wound Assessment / Reassessment - multiple wounds []  - 0 Dermatologic / Skin Assessment (not related to wound area) ASSESSMENTS - Focused Assessment []  - Circumferential Edema Measurements - multi extremities 0 []  - 0 Nutritional Assessment / Counseling / Intervention []  - 0 Lower Extremity Assessment (monofilament, tuning fork, pulses) []  - 0 Peripheral Arterial Disease Assessment (using hand held doppler) ASSESSMENTS - Ostomy and/or Continence Assessment and Care []  - Incontinence Assessment and Management 0 []  - 0 Ostomy Care Assessment and Management (repouching, etc.) PROCESS - Coordination of Care X - Simple Patient / Family Education for ongoing care 1 15 []  - 0 Complex (extensive) Patient / Family Education for ongoing care []  - 0 Staff obtains Chiropractor, Records, Test Results / Process Orders []  - 0 Staff telephones HHA, Nursing Homes / Clarify orders / etc []  - 0 Routine Transfer to another Facility (non-emergent condition) []  - 0 Routine Hospital Admission (non-emergent condition) []  - 0 New Admissions / Manufacturing engineer / Ordering NPWT, Apligraf, etc. []  - 0 Emergency Hospital Admission (emergent condition) X- 1 10 Simple Discharge Coordination [name], [name] ([id number]) []  - 0 Complex (extensive) Discharge Coordination PROCESS - Special Needs []  - Pediatric / Minor Patient Management 0 []  - 0 Isolation Patient Management []  - 0 Hearing / Language / Visual special needs []  - 0 Assessment of Community assistance (transportation, D/C planning, etc.) []  -  0 Additional assistance / Altered mentation []  - 0 Support Surface(s) Assessment (bed, cushion, seat, etc.) INTERVENTIONS - Wound Cleansing / Measurement X - Simple Wound Cleansing - one wound 1 5 []  - 0  Complex Wound Cleansing - multiple wounds X- 1 5 Wound Imaging (photographs - any number of wounds) []  - 0 Wound Tracing (instead of photographs) X- 1 5 Simple Wound Measurement - one wound []  - 0 Complex Wound Measurement - multiple wounds INTERVENTIONS - Wound Dressings []  - Small Wound Dressing one or multiple wounds 0 []  - 0 Medium Wound Dressing one or multiple wounds []  - 0 Large Wound Dressing one or multiple wounds []  - 0 Application of Medications - topical []  - 0 Application of Medications - injection INTERVENTIONS - Miscellaneous []  - External ear exam 0 []  - 0 Specimen Collection (cultures, biopsies, blood, body fluids, etc.) []  - 0 Specimen(s) / Culture(s) sent or taken to Lab for analysis []  - 0 Patient Transfer (multiple staff / Nurse, adult / Similar devices) []  - 0 Simple Staple / Suture removal (25 or less) []  - 0 Complex Staple / Suture removal (26 or more) []  - 0 Hypo / Hyperglycemic Management (close monitor of Blood Glucose) []  - 0 Ankle / Brachial Index (ABI) - do not check if billed separately X- 1 5 Vital Signs [name], [name] ([id number]) Has the patient been seen at the hospital within the last three years: Yes Total Score: 65 Level Of Care: New/Established - Level 2 Electronic Signature(s) Signed: 06/05/2018 5:02:25 PM By: Curtis Sites Entered By: Curtis Sites on 06/05/2018 14:34:19 [name] ([id number]) -------------------------------------------------------------------------------- Encounter Discharge Information Details Patient Name: [name] Date of Service: 06/05/2018 2:15 PM Medical Record Number: [id number] Patient Account Number: [id number] Date of Birth/Sex: 02/14/1956 (62 y.o. M) Treating RN: Curtis Sites Primary Care Hertha Gergen: Franco Nones Other Clinician: Referring Graysen Woodyard: Franco Nones Treating Madisson Kulaga/Extender: Youlanda Roys in Treatment: 22 Encounter Discharge Information Items Discharge Condition: Stable Ambulatory Status: Wheelchair Discharge Destination: Home Transportation: Private Auto Accompanied By: caregiver Schedule Follow-up Appointment: No Clinical Summary of Care: Electronic Signature(s) Signed: 06/05/2018 5:02:25 PM By: Curtis Sites Entered By: Curtis Sites on 06/05/2018 14:34:50 [name] ([id number]) -------------------------------------------------------------------------------- Lower Extremity Assessment Details Patient Name: [name] Date of Service: 06/05/2018 2:15 PM Medical Record Number: [id number] Patient Account Number: [id number] Date of Birth/Sex: [birth year] (62 y.o. M) Treating RN: Huel Coventry Primary Care Cerinity Zynda: Franco Nones Other Clinician: Referring Junius Faucett: Franco Nones Treating Lorenza Shakir/Extender: Youlanda Roys in Treatment: 22 Electronic Signature(s) Signed: 06/05/2018 4:12:20 PM By: Elliot Gurney, BSN, RN, CWS, Kim RN, BSN Entered By: Elliot Gurney, BSN, RN, CWS, Kim on 06/05/2018 14:22:34 [name] ([id number]) -------------------------------------------------------------------------------- Multi Wound Chart Details Patient Name: [name] Date of Service: 06/05/2018 2:15 PM Medical Record Number: [id number] Patient Account Number: [id number] Date of Birth/Sex: 1956-05-24 (62 y.o. M) Treating RN: Curtis Sites Primary Care Johnjoseph Rolfe: Franco Nones Other Clinician: Referring Lanell Dubie: Franco Nones Treating Rilee Wendling/Extender: Youlanda Roys in Treatment: 22 Vital Signs Height(in): 70 Pulse(bpm): 89 Weight(lbs): 155 Blood Pressure(mmHg): 140/85 Body Mass Index(BMI): 22 Temperature(F): 98.6 Respiratory Rate 16 (breaths/min): Photos: [N/A:N/A] Wound Location: Right  Ischium N/A N/A Wounding Event: Pressure Injury N/A N/A Primary Etiology: Pressure Ulcer N/A N/A Comorbid History: Chronic sinus N/A N/A problems/congestion, Anemia, Arrhythmia, Hepatitis C, Type II Diabetes Date Acquired: 12/15/2017 N/A N/A Weeks of Treatment: 22 N/A N/A Wound Status: Healed - Epithelialized N/A N/A Measurements L x W x D 0x0x0 N/A N/A (cm) Area (cm) : 0 N/A N/A Volume (cm) : 0 N/A N/A % Reduction in Area: 100.00% N/A N/A % Reduction in Volume: 100.00% N/A N/A Classification: Category/Stage III N/A N/A Exudate Amount: None Present N/A N/A Wound Margin: Indistinct, nonvisible N/A N/A Granulation  Amount: None Present (0%) N/A N/A Necrotic Amount: None Present (0%) N/A N/A Exposed Structures: Fascia: No N/A N/A Fat Layer (Subcutaneous Tissue) Exposed: No Tendon: No Muscle: No Joint: No Bone: No Epithelialization: Large (67-100%) N/A N/A [name], [name] ([id number]) Periwound Skin Texture: No Abnormalities Noted N/A N/A Periwound Skin Moisture: No Abnormalities Noted N/A N/A Periwound Skin Color: No Abnormalities Noted N/A N/A Tenderness on Palpation: No N/A N/A Treatment Notes Electronic Signature(s) Signed: 06/10/2018 5:29:42 PM By: Wilkie Aye FNP-C Entered By: Wilkie Aye on 06/05/2018 15:14:00 [name] ([id number]) -------------------------------------------------------------------------------- Multi-Disciplinary Care Plan Details Patient Name: [name] Date of Service: 06/05/2018 2:15 PM Medical Record Number: [id number] Patient Account Number: [id number] Date of Birth/Sex: [birth year] (62 y.o. M) Treating RN: Curtis Sites Primary Care Kallum Jorgensen: Franco Nones Other Clinician: Referring Mozetta Murfin: Franco Nones Treating Brinden Kincheloe/Extender: Wilkie Aye Weeks in Treatment: 22 Active Inactive Electronic Signature(s) Signed: 06/05/2018 5:02:25 PM By: Curtis Sites Entered By: Curtis Sites on 06/05/2018  14:33:03 [name] ([id number]) -------------------------------------------------------------------------------- Pain Assessment Details Patient Name: [name] Date of Service: 06/05/2018 2:15 PM Medical Record Number: [id number] Patient Account Number: [id number] Date of Birth/Sex: 31-Dec-1955 (62 y.o. M) Treating RN: Huel Coventry Primary Care Yuritzi Kamp: Franco Nones Other Clinician: Referring Troi Florendo: Franco Nones Treating Deltha Bernales/Extender: Youlanda Roys in Treatment: 22 Active Problems Location of Pain Severity and Description of Pain Patient Has Paino No Site Locations Pain Management and Medication Current Pain Management: Electronic Signature(s) Signed: 06/05/2018 4:12:20 PM By: Elliot Gurney, BSN, RN, CWS, Kim RN, BSN Entered By: Elliot Gurney, BSN, RN, CWS, Kim on 06/05/2018 14:17:38 [name] ([id number]) -------------------------------------------------------------------------------- Patient/Caregiver Education Details Patient Name: [name] Date of Service: 06/05/2018 2:15 PM Medical Record Number: [id number] Patient Account Number: [id number] Date of Birth/Gender: [birth year] (62 y.o. M) Treating RN: Curtis Sites Primary Care Physician: Franco Nones Other Clinician: Referring Physician: Franco Nones Treating Physician/Extender: Youlanda Roys in Treatment: 22 Education Assessment Education Provided To: Patient and Caregiver Education Topics Provided Basic Hygiene: Handouts: Other: care of newly healed ulcer site Methods: Explain/Verbal Responses: State content correctly Electronic Signature(s) Signed: 06/05/2018 5:02:25 PM By: Curtis Sites Entered By: Curtis Sites on 06/05/2018 14:35:16 [name] ([id number]) -------------------------------------------------------------------------------- Wound Assessment Details Patient Name: [name] Date of Service: 06/05/2018 2:15 PM Medical Record Number:  [id number] Patient Account Number: [id number] Date of Birth/Sex: April 14, 1956 (62 y.o. M) Treating RN: Curtis Sites Primary Care Stela Iwasaki: Franco Nones Other Clinician: Referring Cruzita Lipa: Franco Nones Treating Sophonie Goforth/Extender: Wilkie Aye Weeks in Treatment: 22 Wound Status Wound Number: 3 Primary Pressure Ulcer Etiology: Wound Location: Right Ischium Wound Healed - Epithelialized Wounding Event: Pressure Injury Status: Date Acquired: 12/15/2017 Comorbid Chronic sinus problems/congestion, Anemia, Weeks Of Treatment: 22 History: Arrhythmia, Hepatitis C, Type II Diabetes Clustered Wound: No Photos Photo Uploaded By: Elliot Gurney, BSN, RN, CWS, Kim on 06/05/2018 15:03:46 Wound Measurements Length: (cm) 0 % Redu Width: (cm) 0 % Redu Depth: (cm) 0 Epithe Area: (cm) 0 Tunne Volume: (cm) 0 Under ction in Area: 100% ction in Volume: 100% lialization: Large (67-100%) ling: No mining: No Wound Description Classification: Category/Stage III Foul Wound Margin: Indistinct, nonvisible Sloug Exudate Amount: None Present Odor After Cleansing: No h/Fibrino No Wound Bed Granulation Amount: None Present (0%) Exposed Structure Necrotic Amount: None Present (0%) Fascia Exposed: No Fat Layer (Subcutaneous Tissue) Exposed: No Tendon Exposed: No Muscle Exposed: No Joint Exposed: No Bone Exposed: No Periwound Skin Texture Texture Color No Abnormalities Noted: No No Abnormalities Noted: No [name], [name] ([id number]) Moisture No Abnormalities Noted: No Electronic Signature(s) Signed: 06/05/2018 5:02:25 PM By:  Dorthy, Mardene CelesteJoanna Entered By: Curtis Sitesorthy, Joanna on 06/05/2018 14:32:44 [name], [name] ([id number]) -------------------------------------------------------------------------------- Vitals Details Patient Name: [name], [name] Date of Service: 06/05/2018 2:15 PM Medical Record Number: [id number] Patient Account Number: [id number] Date of Birth/Sex: Jun 24, 1956 (62 y.o.  M) Treating RN: Huel CoventryWoody, Kim Primary Care Nyron Mozer: Franco NonesLINDLEY, CHERYL Other Clinician: Referring Veer Elamin: Franco NonesLINDLEY, CHERYL Treating Jsoeph Podesta/Extender: Youlanda RoysKnight, Ikeshia Weeks in Treatment: 22 Vital Signs Time Taken: 14:17 Temperature (F): 98.6 Height (in): 70 Pulse (bpm): 89 Weight (lbs): 155 Respiratory Rate (breaths/min): 16 Body Mass Index (BMI): 22.2 Blood Pressure (mmHg): 140/85 Reference Range: 80 - 120 mg / dl Electronic Signature(s) Signed: 06/05/2018 4:12:20 PM By: Elliot GurneyWoody, BSN, RN, CWS, Kim RN, BSN Entered By: Elliot GurneyWoody, BSN, RN, CWS, Kim on 06/05/2018 14:17:54

## 2018-08-29 IMAGING — CR DG PELVIS 1-2V
1 series · 1 of 1 positions shown · non-contrast
Comparison: Pelvic x-rays dated December 22, 2010.

CLINICAL DATA: Severe left thigh pain.  No known injury.

EXAM:
LEFT FEMUR 2 VIEWS; PELVIS - 1-2 VIEW

[dg pelvis 1-2 views]
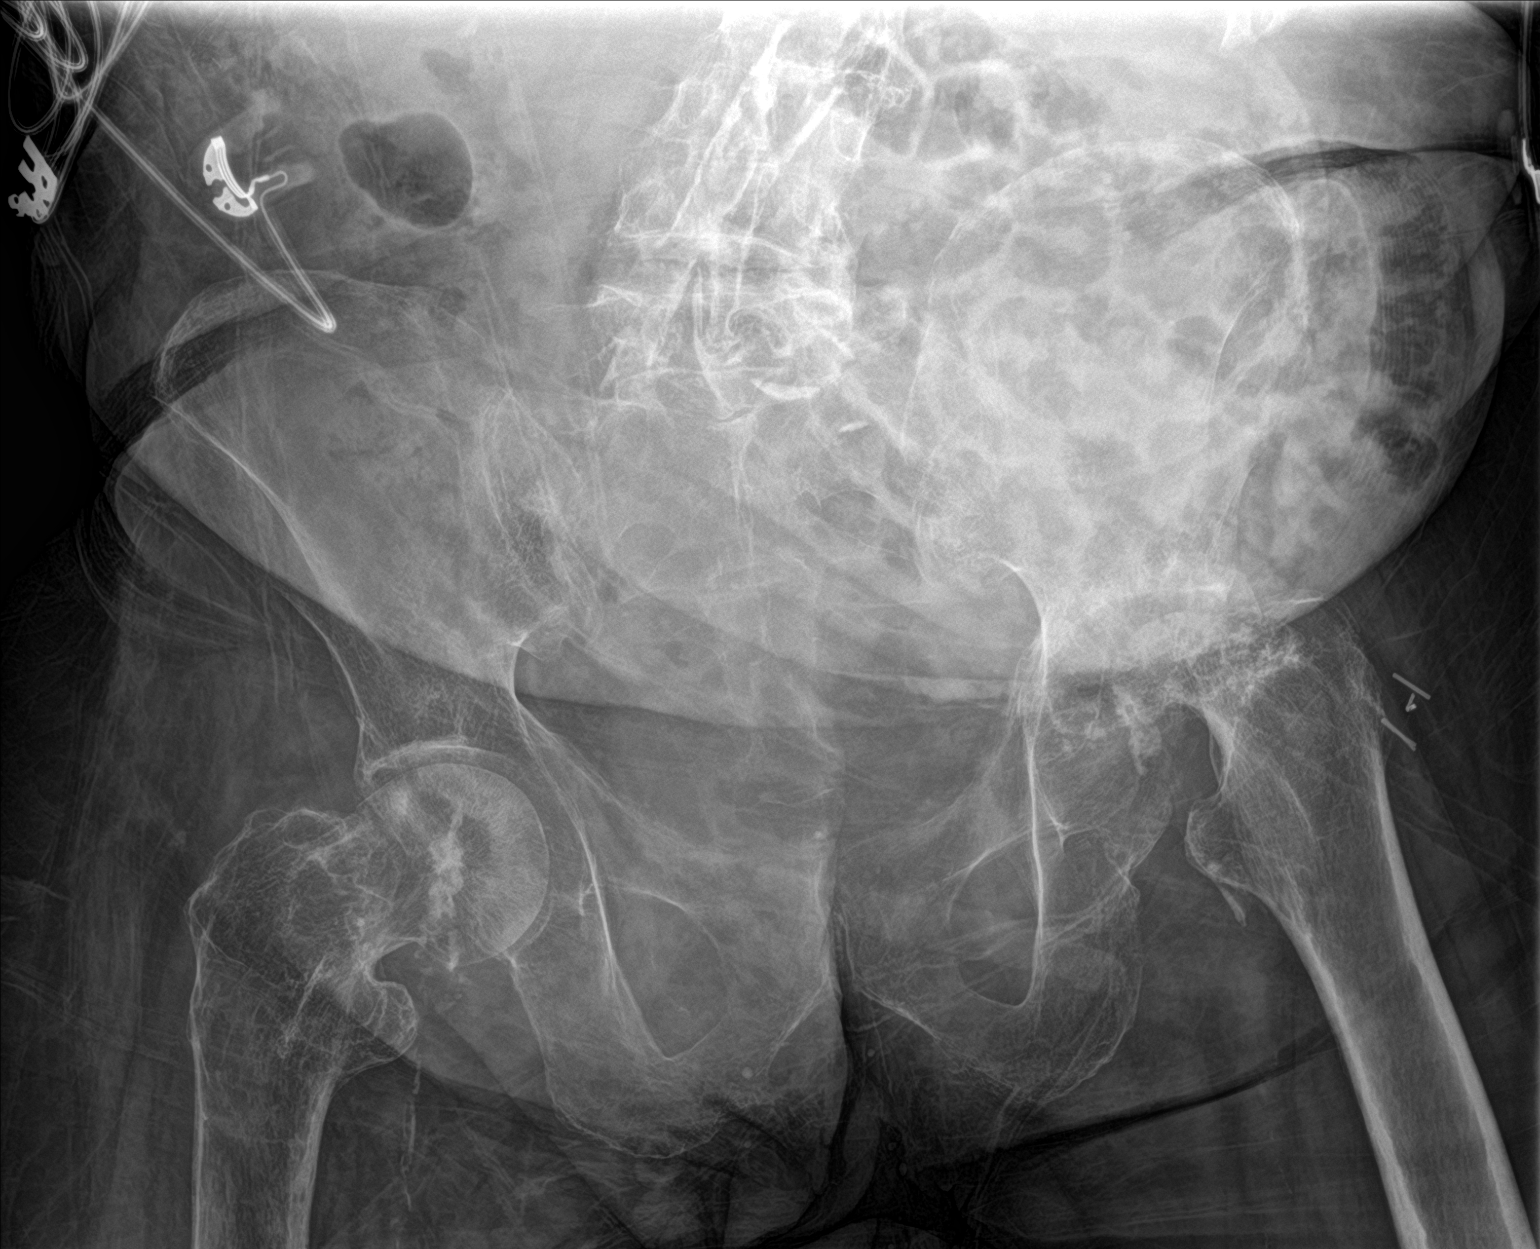

[1 of 1 positions shown; findings below may reference images not displayed]

FINDINGS: No definite acute fracture, although evaluation is limited by severe
osteopenia. Chronic, angulated, healed right intertrochanteric femur
fracture. Unchanged avascular necrosis and resorption of the left
femoral head. Atherosclerotic vascular calcifications.
IMPRESSION: 1. No definite acute osseous abnormality.
2. Unchanged avascular necrosis and resumption of the left femoral
head.
3. Chronic, healed right intertrochanteric femur fracture.

## 2018-08-29 IMAGING — CR DG FEMUR 2+V*L*
1 series · 4 of 4 positions shown · non-contrast
Comparison: Pelvic x-rays dated December 22, 2010.

CLINICAL DATA: Severe left thigh pain.  No known injury.

EXAM:
LEFT FEMUR 2 VIEWS; PELVIS - 1-2 VIEW

[Series 1: dg femur min 2 views left · 0.14mm/px · 4 of 4 slices shown]
[im 1/4]
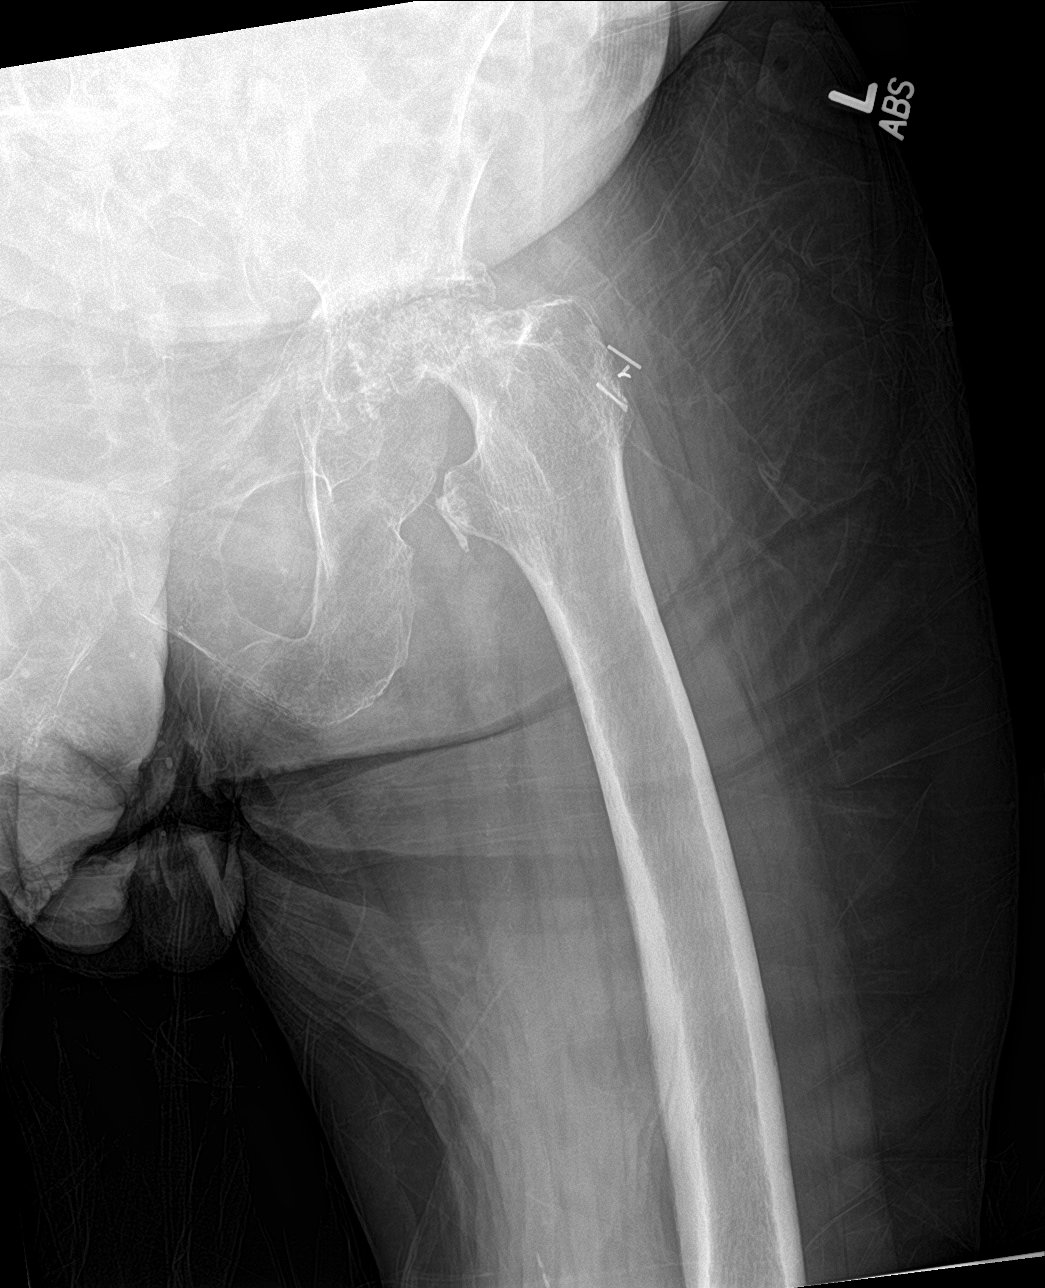
[im 2/4]
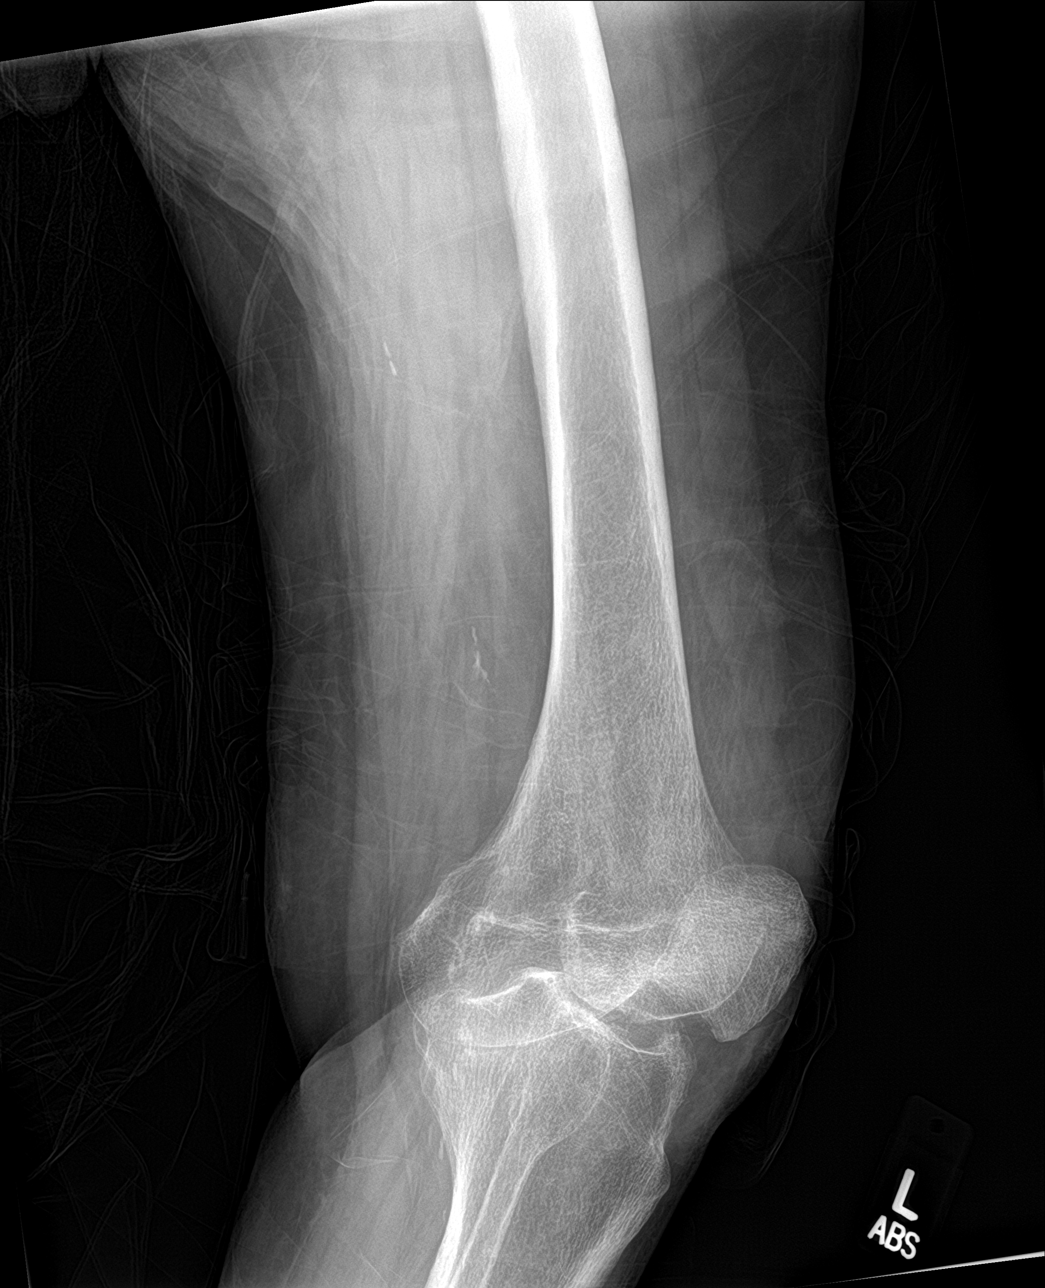
[im 3/4]
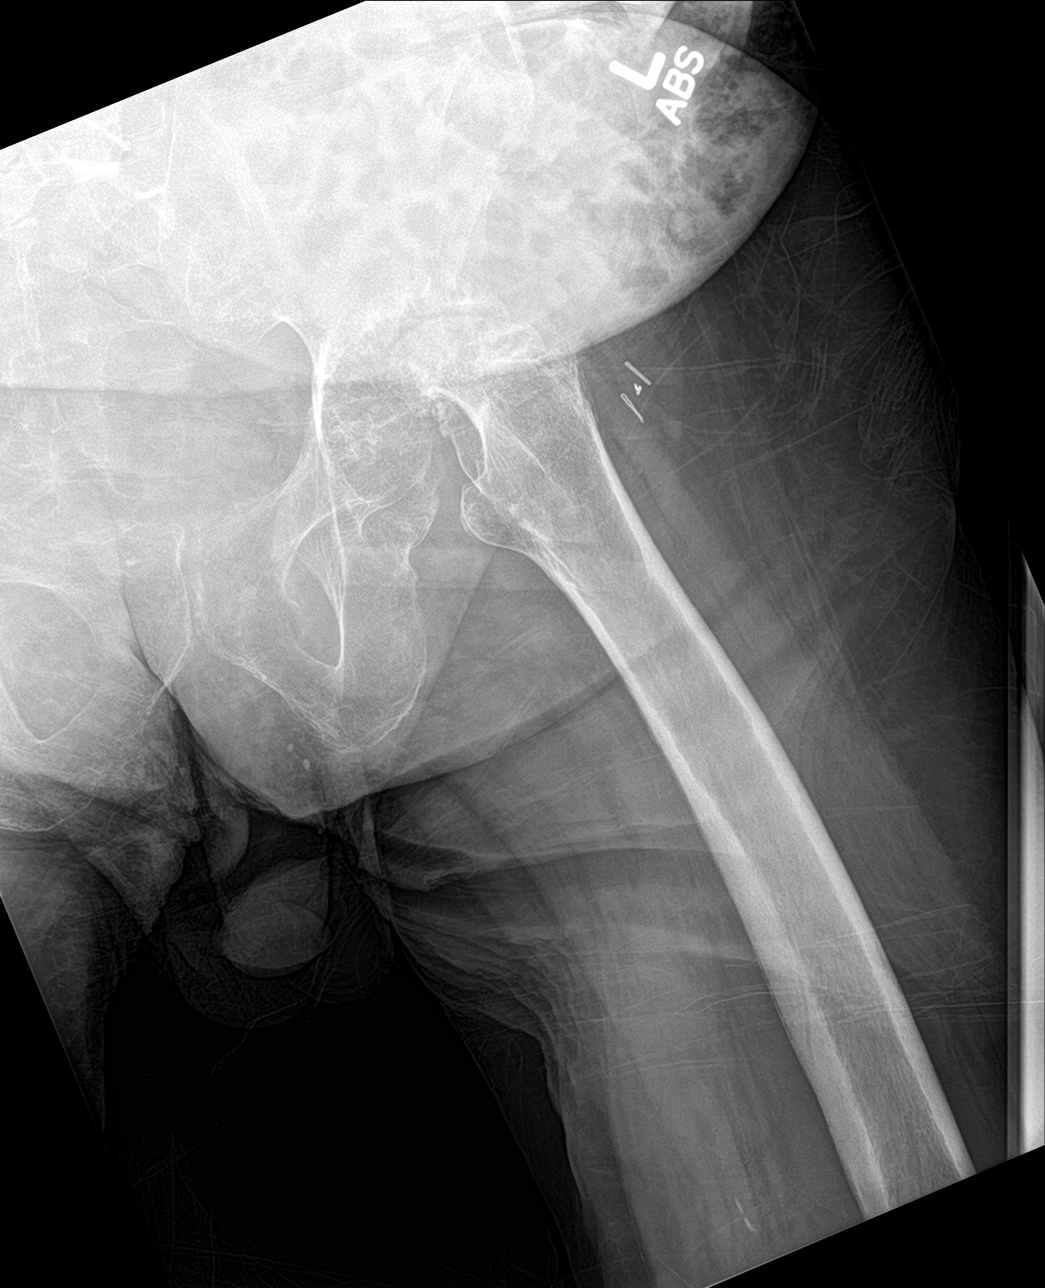
[im 4/4]
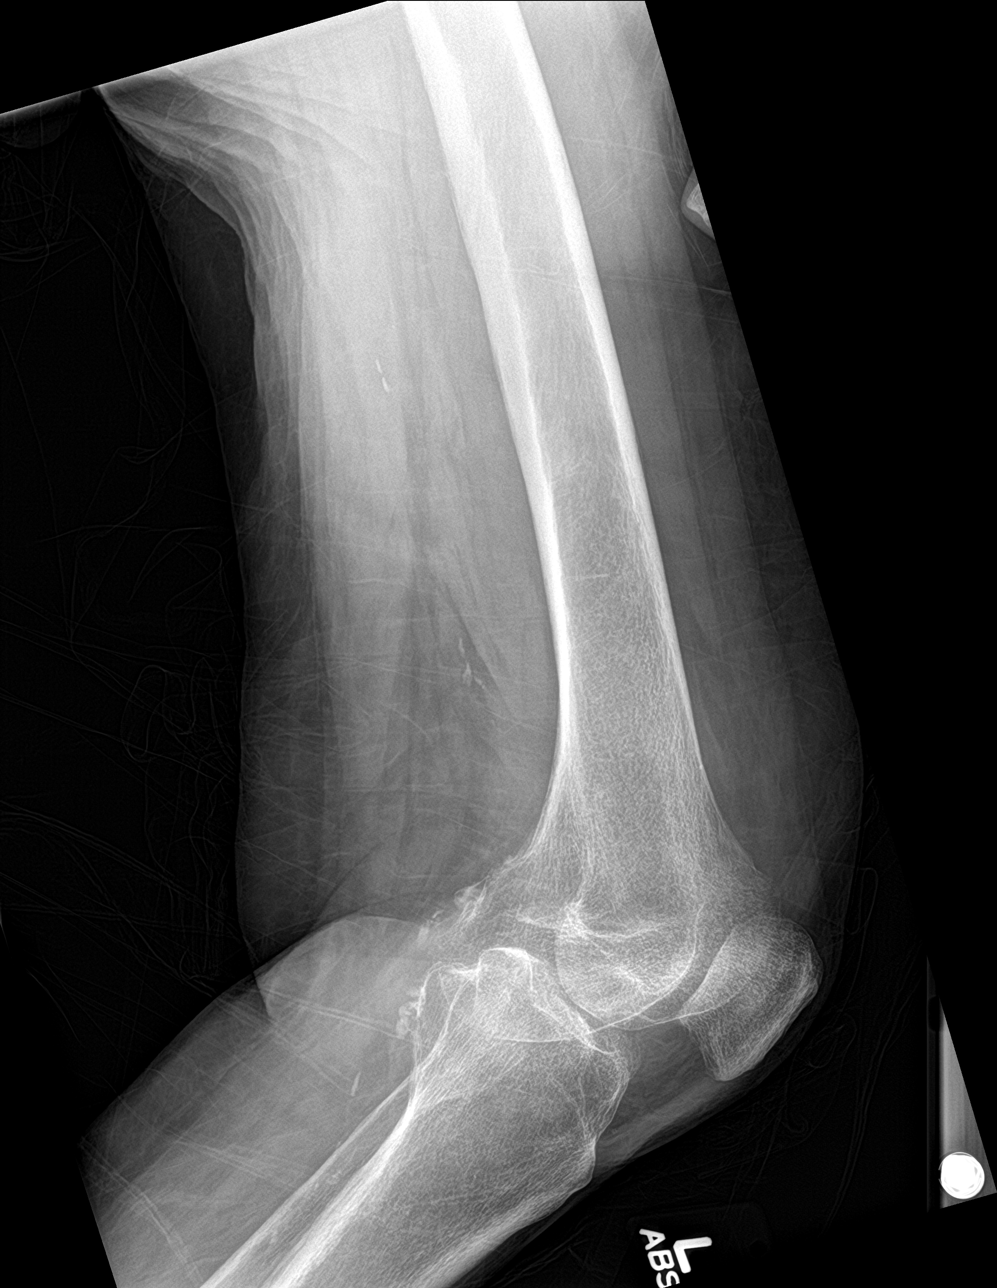

[4 of 4 positions shown; findings below may reference images not displayed]

FINDINGS: No definite acute fracture, although evaluation is limited by severe
osteopenia. Chronic, angulated, healed right intertrochanteric femur
fracture. Unchanged avascular necrosis and resorption of the left
femoral head. Atherosclerotic vascular calcifications.
IMPRESSION: 1. No definite acute osseous abnormality.
2. Unchanged avascular necrosis and resumption of the left femoral
head.
3. Chronic, healed right intertrochanteric femur fracture.

## 2018-09-09 DEATH — deceased

## 2019-04-22 IMAGING — US US EXTREM LOW VENOUS*L*
1 series · 13 of 24 positions shown · non-contrast
Comparison: None.

CLINICAL DATA: LEFT leg pain for 3 days, history hypertension,
diabetes mellitus, smoker



[Series 1: us extrem low venous*left* · 0.08mm/px · 13 of 35 slices shown]
[im 1/35]
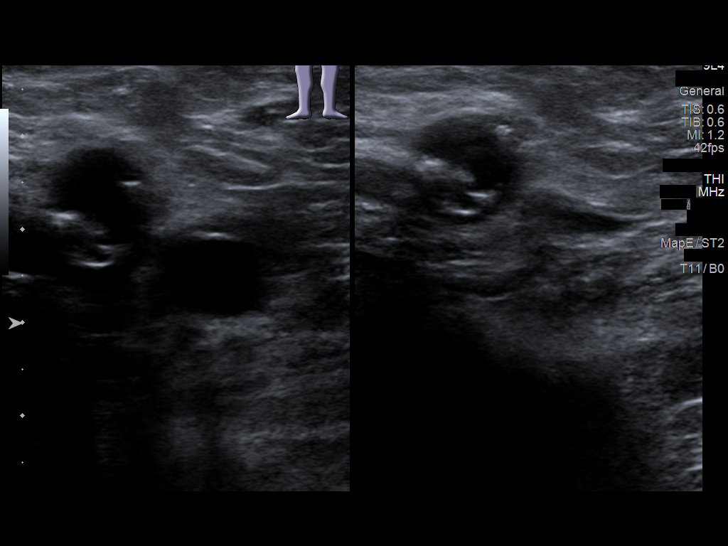
[im 3/35]
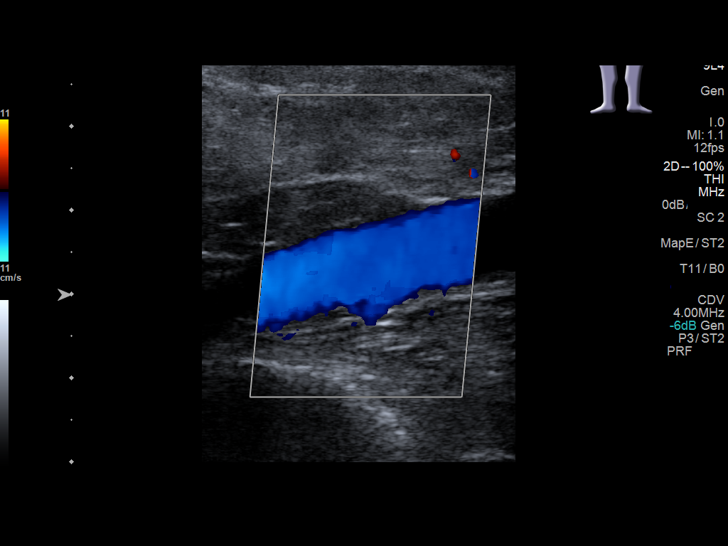
[im 6/35]
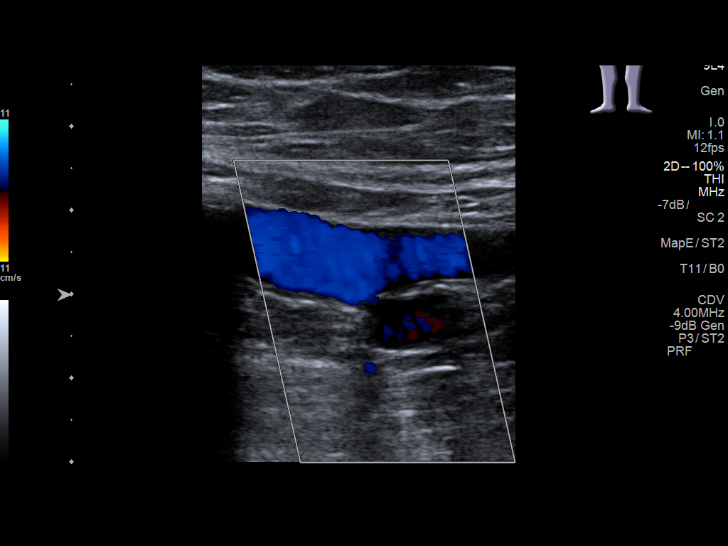
[im 9/35]
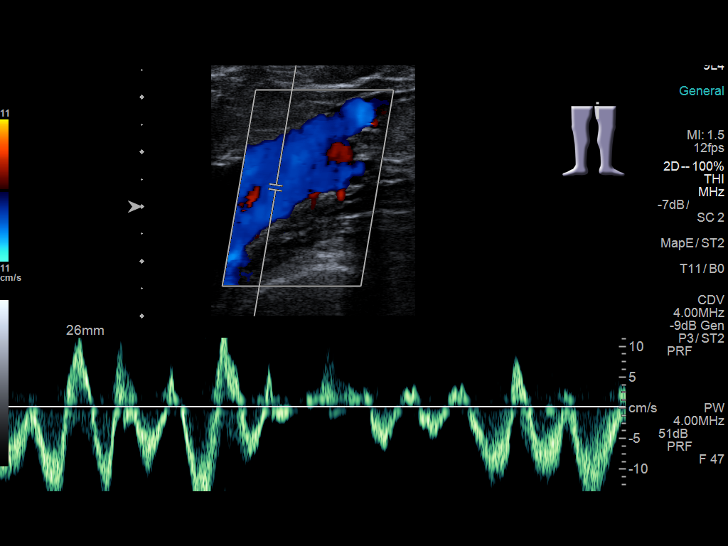
[im 12/35]
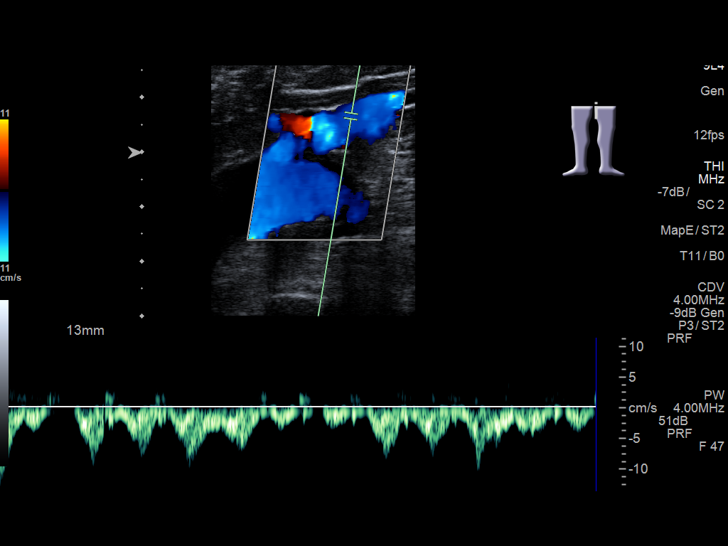
[im 15/35]
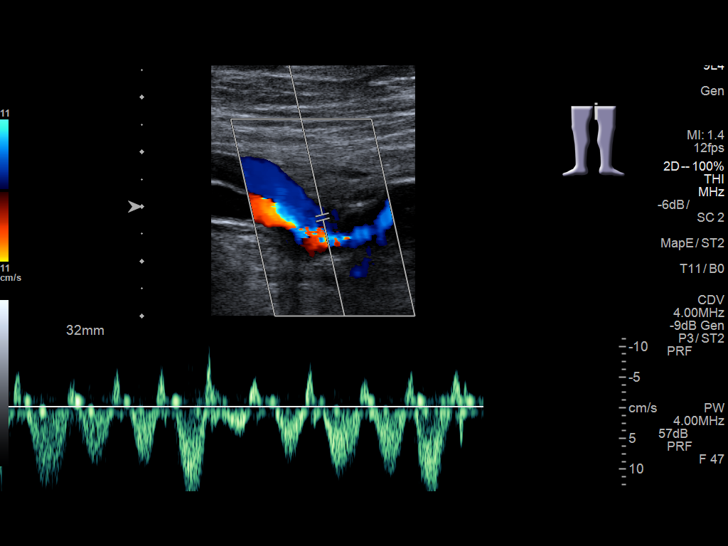
[im 18/35]
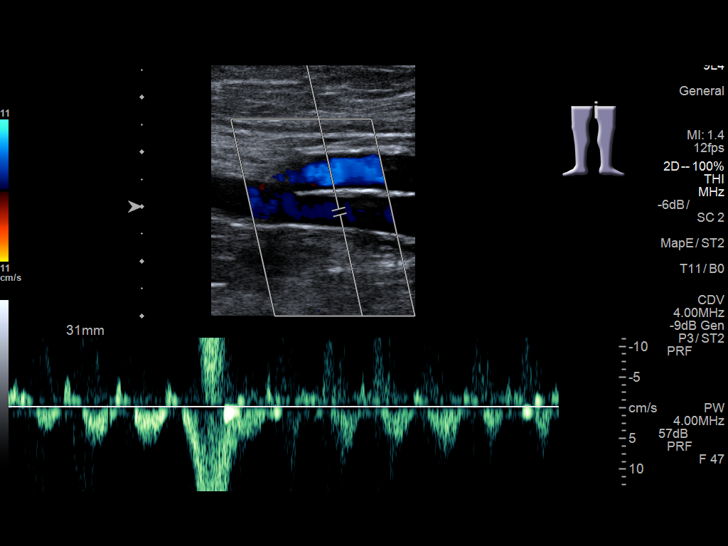
[im 20/35]
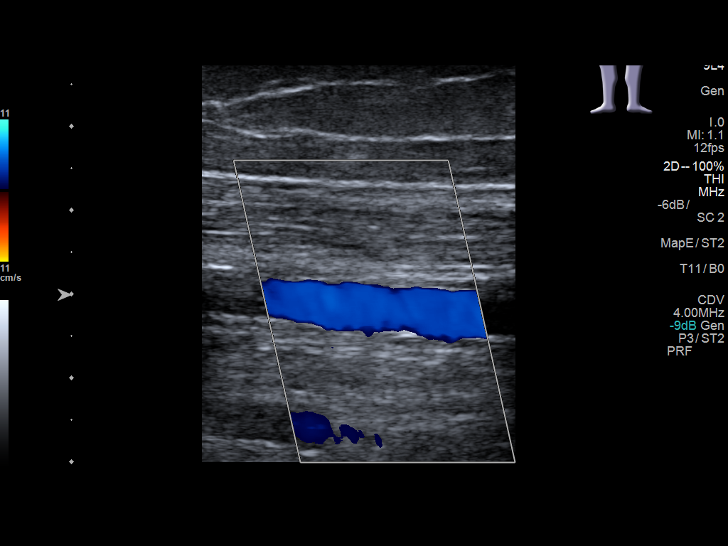
[im 23/35]
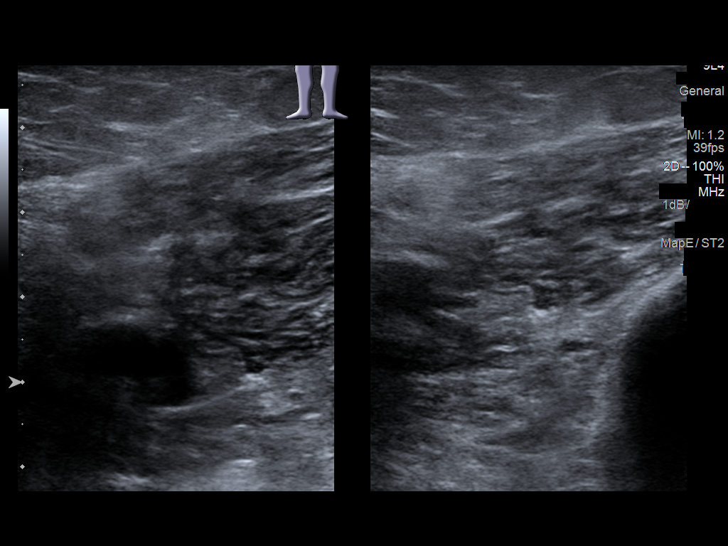
[im 26/35]
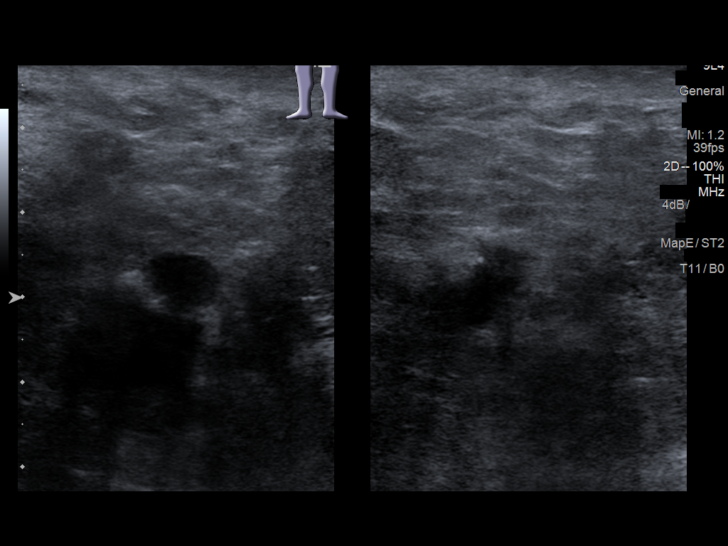
[im 29/35]
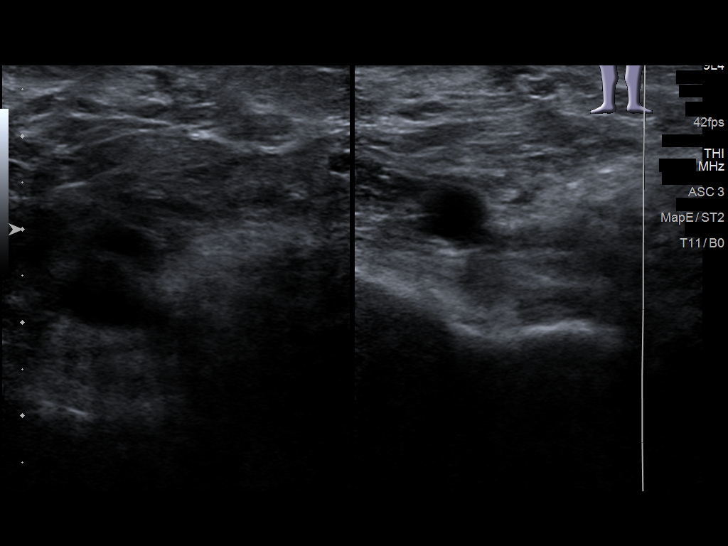
[im 32/35]
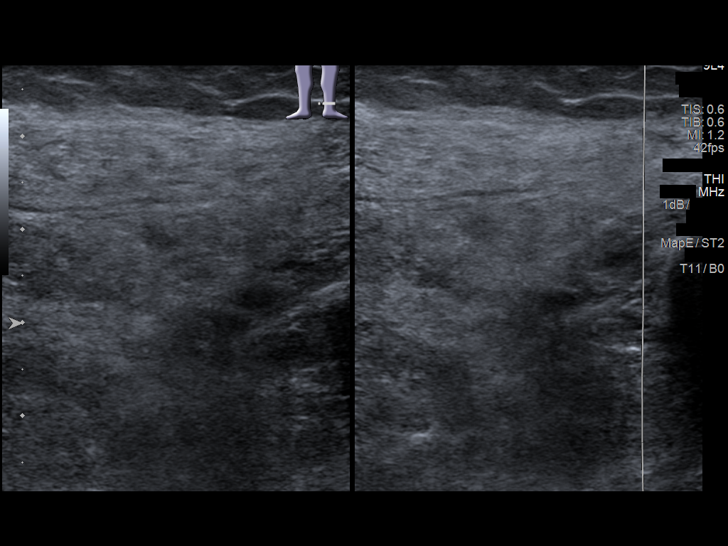
[im 35/35]
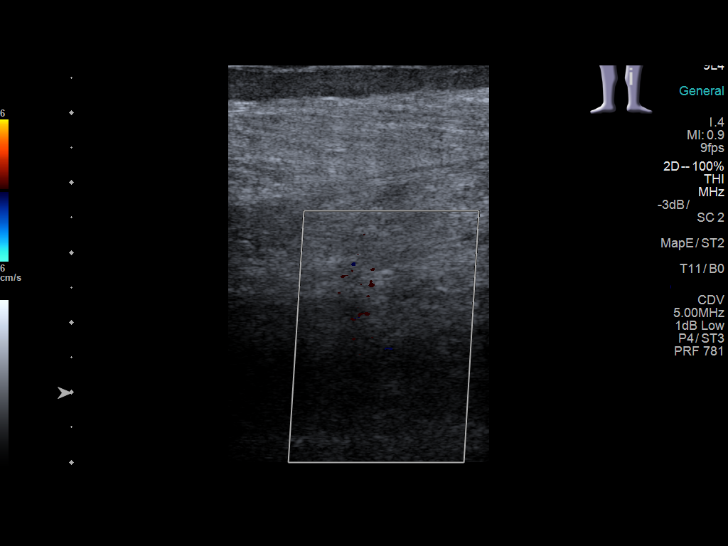

[13 of 24 positions shown; findings below may reference images not displayed]

FINDINGS: Contralateral Common Femoral Vein: Respiratory phasicity is normal
and symmetric with the symptomatic side. No evidence of thrombus.
Normal compressibility.

Common Femoral Vein: No evidence of thrombus. Normal
compressibility, respiratory phasicity and response to augmentation.

Saphenofemoral Junction: No evidence of thrombus. Normal
compressibility and flow on color Doppler imaging.

Profunda Femoral Vein: No evidence of thrombus. Normal
compressibility and flow on color Doppler imaging.

Femoral Vein: No evidence of thrombus. Normal compressibility,
respiratory phasicity and response to augmentation.

Popliteal Vein: No evidence of thrombus. Normal compressibility,
respiratory phasicity and response to augmentation.

Calf Veins: No evidence of thrombus. Normal compressibility and flow
on color Doppler imaging. Peroneal veins not visualized.

Superficial Great Saphenous Vein: No evidence of thrombus. Normal
compressibility.

Venous Reflux:  None.

Other Findings:  None.
IMPRESSION: No evidence of deep venous thrombosis in the LEFT lower extremity.

## 2019-05-10 IMAGING — DX DG CHEST 1V PORT
1 series · 2 of 2 positions shown · non-contrast
Comparison: 03/12/2017.

CLINICAL DATA: Hypotension.  Hypoxia.

EXAM:
PORTABLE CHEST 1 VIEW

[Series 1: chest ap · 0.14mm/px · 2 of 2 slices shown]
[im 1/2]
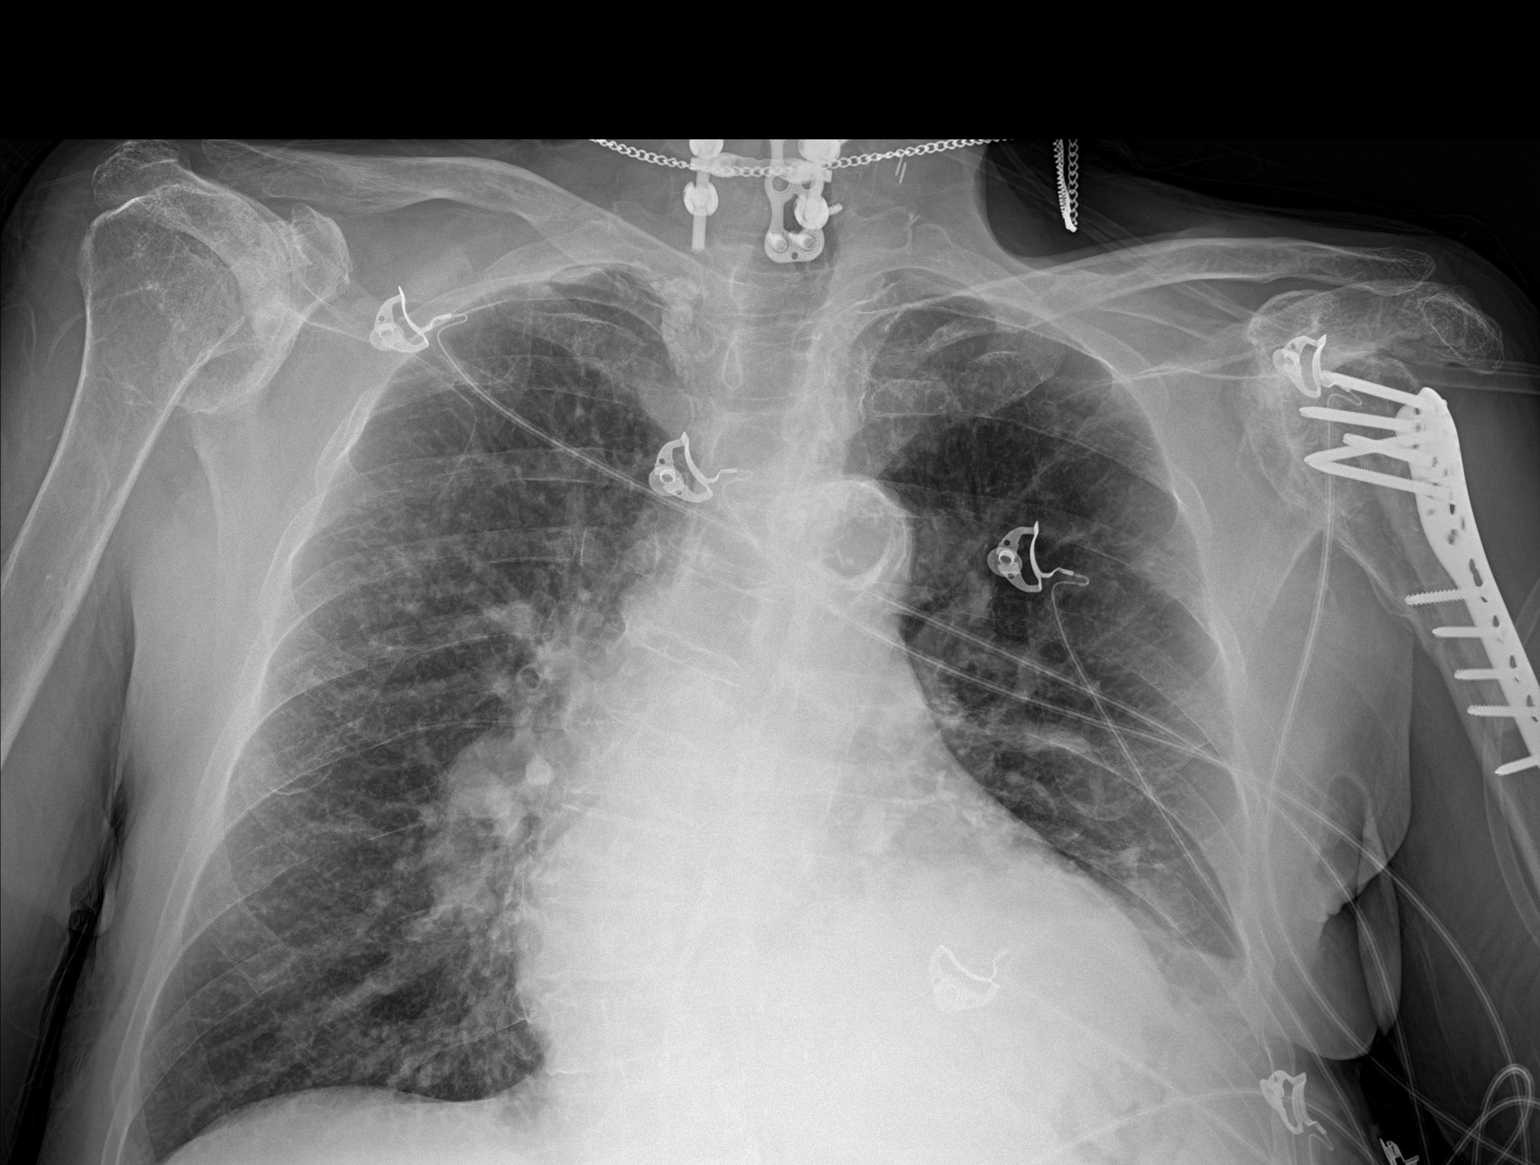
[im 2/2]
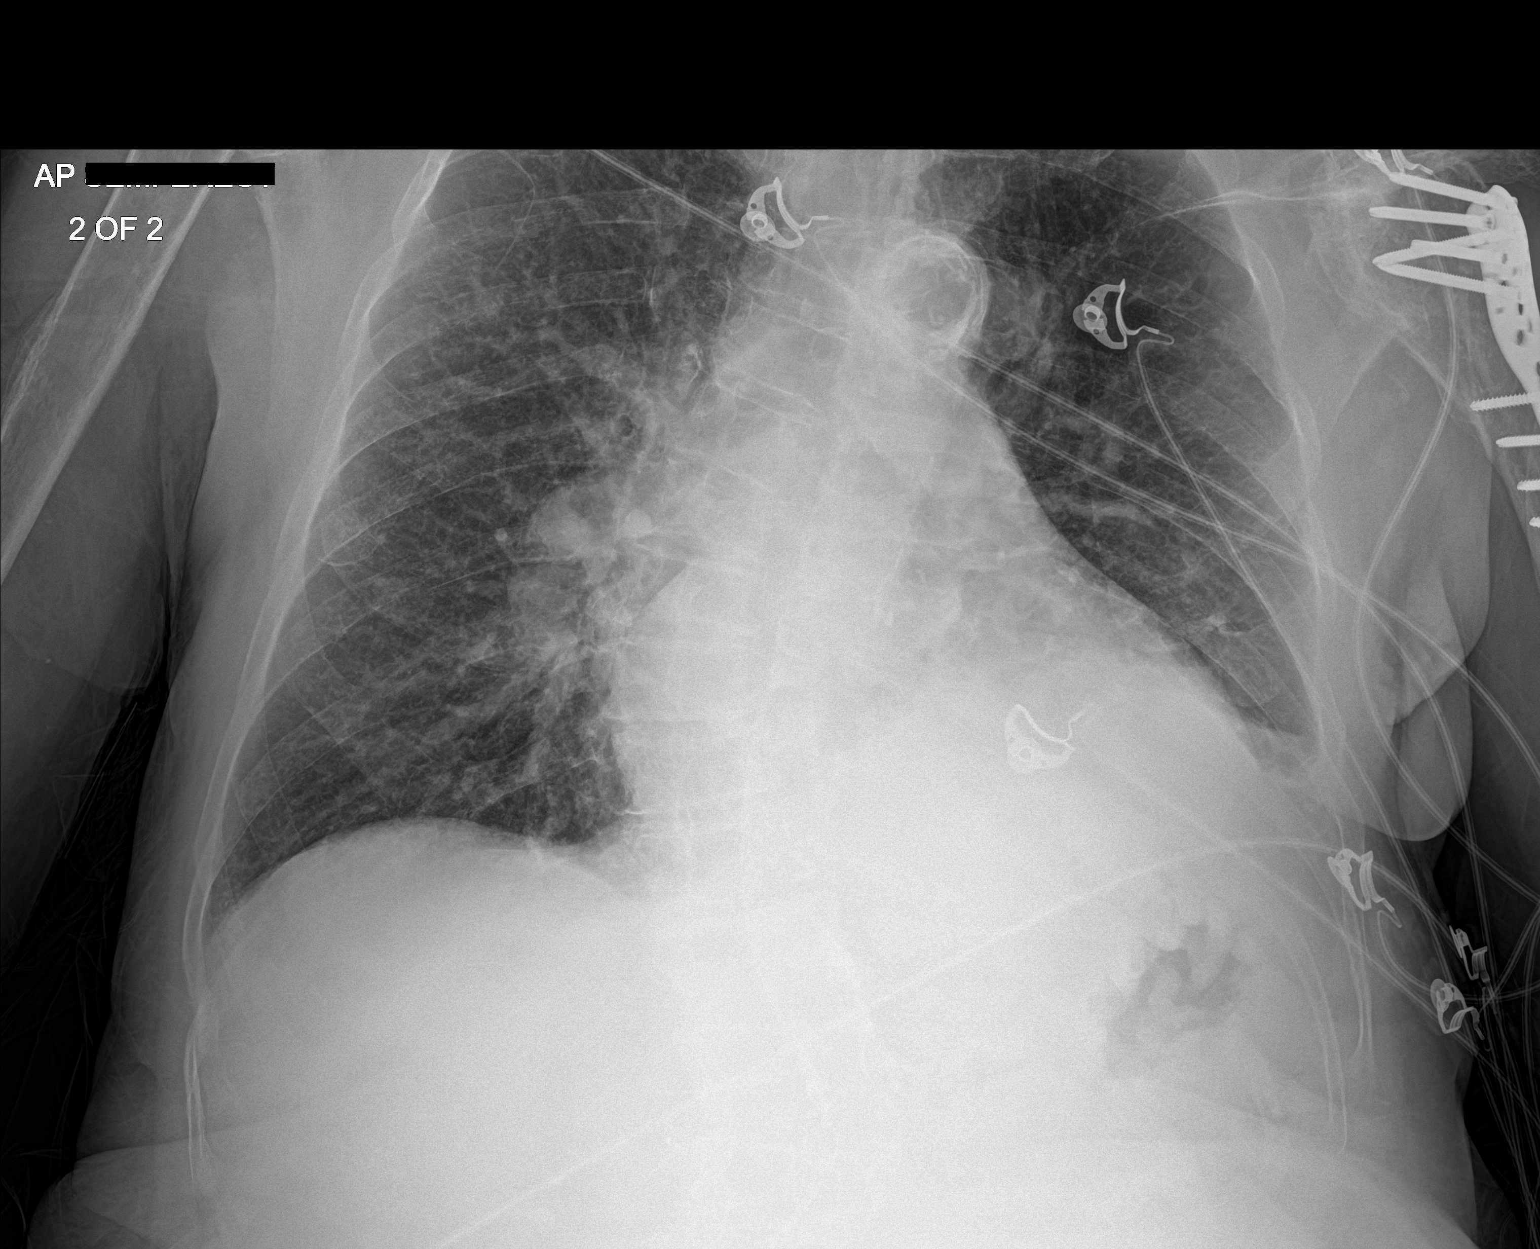

[2 of 2 positions shown; findings below may reference images not displayed]

FINDINGS: Cardiomegaly with mild pulmonary vascular prominence. Left base
atelectasis/infiltrate with small left pleural effusion. No
pneumothorax. Plate and screw fixation of the left humerus. Plate
and screw fixation of the cervical spine. Stable deformity right
humerus. Postsurgical changes left humerus.
IMPRESSION: 1. Left base atelectasis/infiltrate with small left pleural
effusion.

2.  Cardiomegaly with pulmonary venous congestion.
